# Patient Record
Sex: Male | Born: 1935 | Race: Black or African American | Hispanic: No | Marital: Married | Smoking: Former smoker
Health system: Southern US, Community
[De-identification: ages and names within clinical notes are randomized; demographics above are authoritative.]

## PROBLEM LIST (undated history)

## (undated) DIAGNOSIS — N200 Calculus of kidney: Secondary | ICD-10-CM

## (undated) DIAGNOSIS — K602 Anal fissure, unspecified: Secondary | ICD-10-CM

## (undated) DIAGNOSIS — I739 Peripheral vascular disease, unspecified: Secondary | ICD-10-CM

## (undated) DIAGNOSIS — I779 Disorder of arteries and arterioles, unspecified: Secondary | ICD-10-CM

## (undated) DIAGNOSIS — B029 Zoster without complications: Secondary | ICD-10-CM

## (undated) DIAGNOSIS — K259 Gastric ulcer, unspecified as acute or chronic, without hemorrhage or perforation: Secondary | ICD-10-CM

## (undated) DIAGNOSIS — IMO0002 Reserved for concepts with insufficient information to code with codable children: Secondary | ICD-10-CM

## (undated) DIAGNOSIS — K5792 Diverticulitis of intestine, part unspecified, without perforation or abscess without bleeding: Secondary | ICD-10-CM

## (undated) DIAGNOSIS — M47812 Spondylosis without myelopathy or radiculopathy, cervical region: Secondary | ICD-10-CM

## (undated) DIAGNOSIS — I10 Essential (primary) hypertension: Secondary | ICD-10-CM

## (undated) DIAGNOSIS — I2510 Atherosclerotic heart disease of native coronary artery without angina pectoris: Secondary | ICD-10-CM

## (undated) DIAGNOSIS — M461 Sacroiliitis, not elsewhere classified: Secondary | ICD-10-CM

## (undated) DIAGNOSIS — C259 Malignant neoplasm of pancreas, unspecified: Secondary | ICD-10-CM

## (undated) DIAGNOSIS — E785 Hyperlipidemia, unspecified: Secondary | ICD-10-CM

## (undated) HISTORY — PX: KNEE ARTHROSCOPY: SUR90

## (undated) HISTORY — DX: Malignant neoplasm of pancreas, unspecified: C25.9

## (undated) HISTORY — PX: ROTATOR CUFF REPAIR: SHX139

## (undated) HISTORY — PX: CORONARY ARTERY BYPASS GRAFT: SHX141

---

## 1998-02-10 ENCOUNTER — Emergency Department (HOSPITAL_COMMUNITY): Admission: EM | Admit: 1998-02-10 | Discharge: 1998-02-10 | Payer: Self-pay | Admitting: Emergency Medicine

## 1999-03-13 ENCOUNTER — Encounter: Payer: Self-pay | Admitting: Geriatric Medicine

## 1999-03-13 ENCOUNTER — Ambulatory Visit (HOSPITAL_COMMUNITY): Admission: RE | Admit: 1999-03-13 | Discharge: 1999-03-13 | Payer: Self-pay | Admitting: Geriatric Medicine

## 1999-03-14 ENCOUNTER — Encounter: Payer: Self-pay | Admitting: Geriatric Medicine

## 1999-03-14 ENCOUNTER — Ambulatory Visit (HOSPITAL_COMMUNITY): Admission: RE | Admit: 1999-03-14 | Discharge: 1999-03-14 | Payer: Self-pay | Admitting: Geriatric Medicine

## 1999-03-15 ENCOUNTER — Ambulatory Visit (HOSPITAL_COMMUNITY): Admission: RE | Admit: 1999-03-15 | Discharge: 1999-03-15 | Payer: Self-pay | Admitting: Geriatric Medicine

## 1999-03-15 ENCOUNTER — Encounter: Payer: Self-pay | Admitting: Geriatric Medicine

## 1999-03-17 ENCOUNTER — Encounter: Payer: Self-pay | Admitting: Geriatric Medicine

## 1999-03-17 ENCOUNTER — Ambulatory Visit (HOSPITAL_COMMUNITY): Admission: RE | Admit: 1999-03-17 | Discharge: 1999-03-17 | Payer: Self-pay | Admitting: Geriatric Medicine

## 1999-03-21 ENCOUNTER — Encounter: Payer: Self-pay | Admitting: Geriatric Medicine

## 1999-03-21 ENCOUNTER — Ambulatory Visit (HOSPITAL_COMMUNITY): Admission: RE | Admit: 1999-03-21 | Discharge: 1999-03-21 | Payer: Self-pay | Admitting: Geriatric Medicine

## 1999-04-08 ENCOUNTER — Ambulatory Visit: Admission: RE | Admit: 1999-04-08 | Discharge: 1999-04-08 | Payer: Self-pay | Admitting: Pulmonary Disease

## 1999-04-28 ENCOUNTER — Ambulatory Visit (HOSPITAL_COMMUNITY): Admission: RE | Admit: 1999-04-28 | Discharge: 1999-04-28 | Payer: Self-pay | Admitting: Interventional Cardiology

## 1999-05-12 ENCOUNTER — Encounter: Payer: Self-pay | Admitting: Cardiothoracic Surgery

## 1999-05-13 ENCOUNTER — Inpatient Hospital Stay (HOSPITAL_COMMUNITY): Admission: RE | Admit: 1999-05-13 | Discharge: 1999-05-16 | Payer: Self-pay | Admitting: Cardiothoracic Surgery

## 1999-05-13 ENCOUNTER — Encounter: Payer: Self-pay | Admitting: Cardiothoracic Surgery

## 1999-05-14 ENCOUNTER — Encounter: Payer: Self-pay | Admitting: Cardiothoracic Surgery

## 1999-05-15 ENCOUNTER — Encounter: Payer: Self-pay | Admitting: Cardiothoracic Surgery

## 1999-05-29 ENCOUNTER — Encounter: Payer: Self-pay | Admitting: Cardiothoracic Surgery

## 1999-05-29 ENCOUNTER — Encounter: Admission: RE | Admit: 1999-05-29 | Discharge: 1999-05-29 | Payer: Self-pay | Admitting: Cardiothoracic Surgery

## 1999-07-10 ENCOUNTER — Inpatient Hospital Stay (HOSPITAL_COMMUNITY): Admission: EM | Admit: 1999-07-10 | Discharge: 1999-07-12 | Payer: Self-pay | Admitting: Emergency Medicine

## 1999-07-10 ENCOUNTER — Encounter: Payer: Self-pay | Admitting: Cardiothoracic Surgery

## 1999-07-10 ENCOUNTER — Encounter: Payer: Self-pay | Admitting: *Deleted

## 1999-07-10 ENCOUNTER — Encounter: Admission: RE | Admit: 1999-07-10 | Discharge: 1999-07-10 | Payer: Self-pay | Admitting: *Deleted

## 1999-07-10 ENCOUNTER — Encounter: Admission: RE | Admit: 1999-07-10 | Discharge: 1999-07-10 | Payer: Self-pay | Admitting: Cardiothoracic Surgery

## 1999-07-11 ENCOUNTER — Encounter: Payer: Self-pay | Admitting: *Deleted

## 1999-07-14 ENCOUNTER — Ambulatory Visit (HOSPITAL_COMMUNITY): Admission: RE | Admit: 1999-07-14 | Discharge: 1999-07-14 | Payer: Self-pay | Admitting: *Deleted

## 1999-07-14 ENCOUNTER — Encounter: Payer: Self-pay | Admitting: *Deleted

## 1999-11-13 ENCOUNTER — Encounter: Admission: RE | Admit: 1999-11-13 | Discharge: 1999-11-13 | Payer: Self-pay | Admitting: Cardiothoracic Surgery

## 1999-11-13 ENCOUNTER — Encounter: Payer: Self-pay | Admitting: Cardiothoracic Surgery

## 2000-08-26 ENCOUNTER — Inpatient Hospital Stay (HOSPITAL_COMMUNITY): Admission: EM | Admit: 2000-08-26 | Discharge: 2000-08-30 | Payer: Self-pay

## 2000-08-26 ENCOUNTER — Encounter: Payer: Self-pay | Admitting: Emergency Medicine

## 2000-08-29 ENCOUNTER — Encounter: Payer: Self-pay | Admitting: Interventional Cardiology

## 2000-09-14 ENCOUNTER — Encounter (HOSPITAL_COMMUNITY): Admission: RE | Admit: 2000-09-14 | Discharge: 2000-12-13 | Payer: Self-pay | Admitting: Interventional Cardiology

## 2000-11-05 ENCOUNTER — Ambulatory Visit (HOSPITAL_COMMUNITY): Admission: RE | Admit: 2000-11-05 | Discharge: 2000-11-05 | Payer: Self-pay | Admitting: Geriatric Medicine

## 2001-04-21 ENCOUNTER — Ambulatory Visit (HOSPITAL_COMMUNITY): Admission: RE | Admit: 2001-04-21 | Discharge: 2001-04-21 | Payer: Self-pay | Admitting: Interventional Cardiology

## 2001-04-28 ENCOUNTER — Ambulatory Visit (HOSPITAL_COMMUNITY): Admission: RE | Admit: 2001-04-28 | Discharge: 2001-04-29 | Payer: Self-pay | Admitting: Interventional Cardiology

## 2001-06-15 DIAGNOSIS — K259 Gastric ulcer, unspecified as acute or chronic, without hemorrhage or perforation: Secondary | ICD-10-CM

## 2001-06-15 HISTORY — DX: Gastric ulcer, unspecified as acute or chronic, without hemorrhage or perforation: K25.9

## 2001-12-18 ENCOUNTER — Emergency Department (HOSPITAL_COMMUNITY): Admission: EM | Admit: 2001-12-18 | Discharge: 2001-12-18 | Payer: Self-pay | Admitting: Emergency Medicine

## 2001-12-18 ENCOUNTER — Encounter: Payer: Self-pay | Admitting: Emergency Medicine

## 2002-01-13 ENCOUNTER — Encounter: Admission: RE | Admit: 2002-01-13 | Discharge: 2002-01-13 | Payer: Self-pay | Admitting: Geriatric Medicine

## 2002-01-13 ENCOUNTER — Encounter: Payer: Self-pay | Admitting: Geriatric Medicine

## 2002-03-13 ENCOUNTER — Ambulatory Visit (HOSPITAL_COMMUNITY): Admission: RE | Admit: 2002-03-13 | Discharge: 2002-03-13 | Payer: Self-pay | Admitting: Gastroenterology

## 2003-02-05 ENCOUNTER — Encounter: Admission: RE | Admit: 2003-02-05 | Discharge: 2003-05-06 | Payer: Self-pay | Admitting: Geriatric Medicine

## 2003-09-07 ENCOUNTER — Emergency Department (HOSPITAL_COMMUNITY): Admission: EM | Admit: 2003-09-07 | Discharge: 2003-09-08 | Payer: Self-pay

## 2004-03-05 ENCOUNTER — Encounter (HOSPITAL_BASED_OUTPATIENT_CLINIC_OR_DEPARTMENT_OTHER): Admission: RE | Admit: 2004-03-05 | Discharge: 2004-03-11 | Payer: Self-pay | Admitting: Internal Medicine

## 2004-06-04 ENCOUNTER — Encounter (HOSPITAL_BASED_OUTPATIENT_CLINIC_OR_DEPARTMENT_OTHER): Admission: RE | Admit: 2004-06-04 | Discharge: 2004-09-01 | Payer: Self-pay | Admitting: Internal Medicine

## 2004-07-07 ENCOUNTER — Encounter (INDEPENDENT_AMBULATORY_CARE_PROVIDER_SITE_OTHER): Payer: Self-pay | Admitting: Specialist

## 2004-07-07 ENCOUNTER — Ambulatory Visit (HOSPITAL_COMMUNITY): Admission: RE | Admit: 2004-07-07 | Discharge: 2004-07-07 | Payer: Self-pay | Admitting: Gastroenterology

## 2004-09-02 ENCOUNTER — Encounter (HOSPITAL_BASED_OUTPATIENT_CLINIC_OR_DEPARTMENT_OTHER): Admission: RE | Admit: 2004-09-02 | Discharge: 2004-09-10 | Payer: Self-pay | Admitting: Internal Medicine

## 2004-11-06 ENCOUNTER — Ambulatory Visit (HOSPITAL_COMMUNITY): Admission: RE | Admit: 2004-11-06 | Discharge: 2004-11-06 | Payer: Self-pay | Admitting: Interventional Cardiology

## 2004-11-17 ENCOUNTER — Inpatient Hospital Stay (HOSPITAL_COMMUNITY): Admission: RE | Admit: 2004-11-17 | Discharge: 2004-11-21 | Payer: Self-pay | Admitting: Cardiothoracic Surgery

## 2004-12-22 ENCOUNTER — Encounter (HOSPITAL_COMMUNITY): Admission: RE | Admit: 2004-12-22 | Discharge: 2005-03-22 | Payer: Self-pay | Admitting: Interventional Cardiology

## 2005-08-14 ENCOUNTER — Encounter: Admission: RE | Admit: 2005-08-14 | Discharge: 2005-08-14 | Payer: Self-pay | Admitting: Interventional Cardiology

## 2005-08-18 ENCOUNTER — Inpatient Hospital Stay (HOSPITAL_BASED_OUTPATIENT_CLINIC_OR_DEPARTMENT_OTHER): Admission: RE | Admit: 2005-08-18 | Discharge: 2005-08-18 | Payer: Self-pay | Admitting: Interventional Cardiology

## 2006-01-04 ENCOUNTER — Ambulatory Visit: Payer: Self-pay | Admitting: Pulmonary Disease

## 2006-01-05 ENCOUNTER — Encounter: Admission: RE | Admit: 2006-01-05 | Discharge: 2006-01-05 | Payer: Self-pay | Admitting: Geriatric Medicine

## 2006-02-01 ENCOUNTER — Ambulatory Visit: Payer: Self-pay | Admitting: Pulmonary Disease

## 2007-06-30 ENCOUNTER — Ambulatory Visit: Payer: Self-pay | Admitting: *Deleted

## 2008-07-12 ENCOUNTER — Ambulatory Visit: Payer: Self-pay | Admitting: *Deleted

## 2009-07-10 ENCOUNTER — Ambulatory Visit: Payer: Self-pay | Admitting: Surgery

## 2010-07-06 ENCOUNTER — Encounter: Payer: Self-pay | Admitting: Cardiothoracic Surgery

## 2010-07-10 ENCOUNTER — Ambulatory Visit
Admission: RE | Admit: 2010-07-10 | Discharge: 2010-07-10 | Payer: Self-pay | Source: Home / Self Care | Attending: Vascular Surgery | Admitting: Vascular Surgery

## 2010-07-10 ENCOUNTER — Ambulatory Visit: Admit: 2010-07-10 | Payer: Self-pay | Admitting: Vascular Surgery

## 2010-07-11 NOTE — Procedures (Unsigned)
CAROTID DUPLEX EXAM  INDICATION:  Carotid stenosis.  HISTORY: Diabetes:  Yes. Cardiac:  PTCA and stent, CABG. Hypertension:  Yes. Smoking:  Previous. Previous Surgery:  No. CV History:  Asymptomatic. Amaurosis Fugax No, Paresthesias No, Hemiparesis No.                                      RIGHT             LEFT Brachial systolic pressure:         125               127 Brachial Doppler waveforms:         Normal            Normal Vertebral direction of flow:        Antegrade         Antegrade DUPLEX VELOCITIES (cm/sec) CCA peak systolic                   62                M = 94, D = 209 ECA peak systolic                   96                311 ICA peak systolic                   84                166 ICA end diastolic                   32                69 PLAQUE MORPHOLOGY:                  Calcific          Calcific PLAQUE AMOUNT:                      Mild              Moderate PLAQUE LOCATION:                    ICA, ECA, CCA     ICA, ECA, CCA  IMPRESSION: 1. Right internal carotid artery velocities suggest a 1% to 39%     stenosis. 2. Left internal carotid artery velocities suggest 40% to 59%     stenosis. 3. Left distal common carotid artery stenosis. 4. Left external carotid artery stenosis.  ___________________________________________ Janetta Hora Fields, MD  EM/MEDQ  D:  07/10/2010  T:  07/10/2010  Job:  [id number]

## 2010-07-11 NOTE — Assessment & Plan Note (Signed)
OFFICE VISIT  Christian Kaiser, Christian Kaiser DOB:  1934/12/03                                       07/10/2010 UEAVW#:[id number]  CHIEF COMPLAINT:  Carotid stenosis.  HISTORY OF PRESENT ILLNESS:  The patient is a 76 year old male who has a known asymptomatic carotid stenosis.  He denies any symptoms of TIA, amaurosis or stroke.  The carotid stenosis has been known since 2006 on a screening duplex that was done prior to coronary artery bypass grafting.  The patient denies any recent cardiac symptoms as well as denying any recent neurologic symptoms.  CHRONIC MEDICAL PROBLEMS:  Include diabetes, coronary artery disease and elevated cholesterol.  These are both followed by Dr. Katrinka Blazing and Dr. Pete Glatter and they are currently stable.  PAST SURGICAL HISTORY:  Kidney stone, coronary artery bypass grafting.  SOCIAL HISTORY:  He has 2 children.  He is retired.  He is married.  He is a nonsmoker currently.  He is retired from [employer].  FAMILY HISTORY:  Not remarkable for vascular disease at a young age.  REVIEW OF SYSTEMS:  He has some pain in his legs when walking.  However, this does not sound like claudication.  All other systems are negative.  PHYSICAL EXAM:  Vital signs:  Blood pressure is 124/67 in the right arm, 115/66 in the left arm, oxygen saturation is 97% on room air, heart rate 64.  HEENT:  Unremarkable.  Neck:  Has 2+ carotid pulses.  He has no carotid bruit.  Chest:  Clear to auscultation.  Cardiac:  Regular rate and rhythm without murmur.  Abdomen:  Soft, nontender, nondistended.  No masses.  Extremities:  He has 2+ radial, femoral, dorsalis pedis and posterior tibial pulses bilaterally.  Musculoskeletal:  Shows no obvious major joint deformities.  Neurologic:  Shows symmetric upper extremity and lower extremity motor strength which is 5/5.  Skin:  Has no open ulcers or rashes.  He had a carotid duplex exam today which showed less than 40% right internal  carotid artery stenosis.  He had a 40% to 60% left internal carotid artery stenosis.  His duplex is essentially unchanged.  In summary, the patient has a moderate left internal carotid artery stenosis which is currently asymptomatic.  I believe the best option for him would be continued surveillance.  We will reschedule him for a carotid duplex exam in 1 year's time.  He will continue his antiplatelet therapy in the form of aspirin 81 mg once a day.    Janetta Hora. Fields, MD Electronically Signed  CEF/MEDQ  D:  07/10/2010  T:  07/11/2010  Job:  [id number]  cc:   Hal T. Stoneking, M.D. Lyn Records, M.D.

## 2010-10-27 ENCOUNTER — Other Ambulatory Visit: Payer: Self-pay | Admitting: Orthopaedic Surgery

## 2010-10-27 DIAGNOSIS — M25562 Pain in left knee: Secondary | ICD-10-CM

## 2010-10-28 NOTE — Procedures (Signed)
CAROTID DUPLEX EXAM   INDICATION:  Follow up carotid artery disease.   HISTORY:  Diabetes:  Yes  Cardiac:  PTCA and stent, CABG  Hypertension:  Yes  Smoking:  Previous  Previous Surgery:  No  CV History:  Asymptomatic  Amaurosis Fugax No, Paresthesias No, Hemiparesis No                                       RIGHT             LEFT  Brachial systolic pressure:         120               124  Brachial Doppler waveforms:         WNL               WNL  Vertebral direction of flow:        Antegrade         Antegrade  DUPLEX VELOCITIES (cm/sec)  CCA peak systolic                   66                76  ECA peak systolic                   130               236  ICA peak systolic                   95                158  ICA end diastolic                   28                44  PLAQUE MORPHOLOGY:                  Calcific          Calcific  PLAQUE AMOUNT:                      Mild              Moderate  PLAQUE LOCATION:                    ICA/ECA/CCA       ICA/ECA/CCA   IMPRESSION:  1. Right internal carotid artery shows evidence of 20% to 39%      stenosis.  2. Left internal carotid artery shows evidence of 40% to 59% stenosis.  3. Left external carotid artery stenosis.  4. No significant changes from previous study.   The patient is scheduled to see doctor here in 1 year at time of next  ultrasound due to time since last appointment with vascular doctor.   ___________________________________________  V. Charlena Cross, MD   AS/MEDQ  D:  07/10/2009  T:  07/10/2009  Job:  [id number]   cc:   Hal T. Stoneking, M.D.  Lyn Records, M.D.

## 2010-10-28 NOTE — Procedures (Signed)
CAROTID DUPLEX EXAM   INDICATION:  Followup carotid artery disease.   HISTORY:  Diabetes:  Yes.  Cardiac:  PTCA and stent, CABG.  Hypertension:  Yes.  Smoking:  Previous.  Previous Surgery:  No.  CV History:  Currently asymptomatic.  Amaurosis Fugax No, Paresthesias No, Hemiparesis No                                       RIGHT             LEFT  Brachial systolic pressure:         150               140  Brachial Doppler waveforms:         Normal            Normal  Vertebral direction of flow:        Antegrade         Antegrade  DUPLEX VELOCITIES (cm/sec)  CCA peak systolic                   81                84  ECA peak systolic                   93                239  ICA peak systolic                   92                159  ICA end diastolic                   29                27  PLAQUE MORPHOLOGY:                  Calcific          Calcific  PLAQUE AMOUNT:                      Mild              Moderate  PLAQUE LOCATION:                    ICA/ECA/CCA       ICA/ECA/CCA   IMPRESSION:  1. 1-39% stenosis of the right internal carotid artery.  2. 40-59% stenosis of the left internal carotid artery.  3. No significant change noted when compared to the previous exam on      06/30/2007.   ___________________________________________  P. Liliane Bade, M.D.   CH/MEDQ  D:  07/12/2008  T:  07/12/2008  Job:  [id number]   cc:   Hal T. Stoneking, M.D.

## 2010-10-28 NOTE — Procedures (Signed)
CAROTID DUPLEX EXAM   INDICATION:  Followup, carotid artery disease.   HISTORY:  Diabetes:  Yes, on oral medication.  Cardiac:  PTCA and stent, CABG, in June, 2006.  Hypertension:  Yes.  Smoking:  Quit in 1990.  Previous Surgery:  CABG.  CV History:  Amaurosis Fugax No, Paresthesias No, Hemiparesis No                                       RIGHT             LEFT  Brachial systolic pressure:         148               140  Brachial Doppler waveforms:         Biphasic          Biphasic  Vertebral direction of flow:        Antegrade         Antegrade  DUPLEX VELOCITIES (cm/sec)  CCA peak systolic                   72                79  ECA peak systolic                   67                136  ICA peak systolic                   62                153  ICA end diastolic                   19                39  PLAQUE MORPHOLOGY:                  Calcified         Calcified  PLAQUE AMOUNT:                      Mild              Moderate  PLAQUE LOCATION:                    ICA               ICA, ECA   IMPRESSION:  1. Mild left external carotid artery stenosis.  2. 40-59% left internal carotid artery stenosis by new criteria.  3. 20-39% right internal carotid artery stenosis.  4. Study unchanged from 06/24/2006.   ___________________________________________  P. Liliane Bade, M.D.   DP/MEDQ  D:  06/30/2007  T:  06/30/2007  Job:  [id number]

## 2010-10-30 ENCOUNTER — Ambulatory Visit
Admission: RE | Admit: 2010-10-30 | Discharge: 2010-10-30 | Disposition: A | Discharge status: Home / Self Care | Payer: MEDICARE | Source: Ambulatory Visit | Attending: Orthopaedic Surgery | Admitting: Orthopaedic Surgery

## 2010-10-30 DIAGNOSIS — M25562 Pain in left knee: Secondary | ICD-10-CM

## 2010-10-31 NOTE — Op Note (Signed)
NAME:  MATAI, CARPENITO NO.:  [id number]   MEDICAL RECORD NO.:  [id number]          PATIENT TYPE:  AMB   LOCATION:  ENDO                         FACILITY:  [facility]   PHYSICIAN:  Danise Edge, M.D.   DATE OF BIRTH:  [birth year]   DATE OF PROCEDURE:  07/07/2004  DATE OF DISCHARGE:                                 OPERATIVE REPORT   PROCEDURE:  Colonoscopy.   PROCEDURE INDICATION:  Mr. Sherrod Toothman is a 75 year old male born [birth year].  Mr. Kanner is scheduled to undergo his first screening colonoscopy  with polypectomy to prevent colon cancer.   MEDICATION ALLERGIES:  None.   CHRONIC MEDICATIONS:  Altace, Pravachol, Plavix, Amaryl, Actos.   PAST MEDICAL HISTORY:  1.  Gastric ulcer 2003.  2.  Coronary artery disease complicated by anterior myocardial infarction,      four-vessel coronary artery bypass grafting, 1990.  3.  Nephrolithiasis.  4.  Colonic diverticulosis.  5.  Type 2 diabetes mellitus.  6.  Anal fissure repair.  7.  Left shoulder surgery  8.  Right parotid duct stone.  9.  Shingles  10. Sacro-iliitis.  11. Hypertension.  12. Cervical spondylosis.   ENDOSCOPIST:  Danise Edge, M.D.   PREMEDICATION:  Versed 6 mg, Demerol 50 mg.   PROCEDURE:  After obtaining informed consent, Mr. Wemhoff was placed in the  left lateral decubitus position.  I administered intravenous Demerol and  intravenous Versed to achieve conscious sedation for the procedure.  The  patient's blood pressure, oxygen saturation and cardiac rhythm were  monitored throughout the procedure and documented in the medical record.   Anal inspection and digital rectal exam normal.  The prostate was  nonnodular.  The Olympus adjustable pediatric colonoscope was introduced  into the rectum and advanced to the cecum.  Colonic preparation for the exam  today was excellent.   Rectum:  From the midrectum a 1-mm sessile polyp was removed with cold  biopsy forceps.   Sigmoid colon  and descending colon:  Colonic diverticulosis.   Splenic flexure normal.   Transverse colon normal.   Hepatic flexure normal.   Ascending colon normal.   Cecum and ileocecal valve normal.   ASSESSMENT:  1.  A diminutive polyp was removed from the rectum.  2.  Left colonic diverticulosis.  3.  Otherwise normal proctocolonoscopy to the cecum.                                               ______________________________  Danise Edge, M.D.    MJ/MEDQ  D:  07/07/2004  T:  07/07/2004  Job:  [id number]   cc:   Hal T. Stoneking, M.D.  [address] [place], [place] [address]  Fax: [phone]

## 2010-10-31 NOTE — Cardiovascular Report (Signed)
NAME:  Christian Kaiser, Christian Kaiser NO.:  [id number]   MEDICAL RECORD NO.:  [id number]          PATIENT TYPE:  OIB   LOCATION:  1966                         FACILITY:  [facility]   PHYSICIAN:  Christian Kaiser, M.D.   DATE OF BIRTH:  1934-09-25   DATE OF PROCEDURE:  08/18/2005  DATE OF DISCHARGE:  08/18/2005                              CARDIAC CATHETERIZATION   Christian Kaiser has undergone coronary bypass grafting on two prior occasions,  most recently in 2005. We had a redo saphenous vein graft to the right  coronary and a redo sequential saphenous vein graft to the second and third  obtuse marginal branches. The LIMA to the LAD was left intact with the  previous operations in the early 1990s. In the past month, he has begun  experiencing exertional dyspnea, had tightness in his chest and a Cardiolite  study demonstrated midlateral wall ischemia. The procedure is being done to  define coronary anatomy.   PROCEDURE:  1.  Left heart catheterization.  2.  Selective angiography.  3.  Left ventriculography.  4.  Saphenous vein graft angiography.  5.  Left internal mammary graft selective angiography.   DESCRIPTION:  After informed consent, a 4-French sheath was placed in the  right femoral artery using the modified Seldinger technique. A 4-French A2  multipurpose catheter was then used for hemodynamic recordings. Left  ventriculography by hand injection of the RAO 30 degree and LAO 40 with 20  degrees of cranial angulation. Following this, a pullback pressure was  recorded across the aortic valve. The multipurpose catheter was then used  for native right coronary angiography and saphenous vein graft angiography.  A #4 4-French left Judkins catheter was used for left coronary angiography  and a #4 internal mammary catheter was used for internal mammary artery  angiography. We used two types of internal mammary catheter. One was a  Cordis catheter and the other was a Medtronic  catheter.   Following the procedure, the sheath was removed and hemostasis achieved by  manual pressure.   RESULTS:  1.  Hemodynamic data:      1.  Aortic pressure 127/76.      2.  Left ventricular pressure was 28/6 mmHg.  2.  Left ventriculography: Low normal and estimated to be 45-55%. No obvious      mitral regurgitation is noted. Surprisingly, with two previous lateral      wall infarction, the patient's lateral wall motion is relatively normal.  3.  Coronary angiography:      1.  Left main coronary: Totally occluded.      2.  The left anterior descending coronary: Occluded.      3.  The circumflex artery: Occluded.      4.  The right coronary artery: Totally occluded proximally.  4.  Saphenous vein graft angiography.      1.  The sequential saphenous vein graft to the second and third obtuse          marginal branches is widely patent. Retrograde filling of the first          obtuse  marginal is late and there is disease at the origin from the          proximal circumflex that causes this territory to be potentially          ischemic. There is not a way to reach this territory for PCI          purposes. There is also disease in the distal circumflex, obtuse          marginal #4 territory that is also a threat but without an          opportunity to be reached and potentially exempted.      2.  Saphenous vein graft to the right coronary: Widely patent.  5.  Internal mammary graft angiography: This graft is widely patent. Runoff      into the native LAD was now well visualized but flow antegrade from the      graft insertion site is normal. There is no obstructive disease noted      and there is no midvessel obstructive disease noted proximal to the      graft insertion site.  6.  Total occlusion of the left main and right coronary ostium.  7.  Mild decreased to low normal left ventricular function, EF 45-50%   PLAN:  Medical therapy.      Christian Kaiser, M.D.   Electronically Signed     HWS/MEDQ  D:  08/18/2005  T:  08/18/2005  Job:  [id number]   cc:   Christian Kaiser, M.D.  Fax: [phone]   Sheliah Plane, MD  [address]  [place]  [place] [address]

## 2010-10-31 NOTE — Assessment & Plan Note (Signed)
[facility]                               PULMONARY OFFICE NOTE   LOPEZ, DENTINGER                      MRN:          [id number]  DATE:01/04/2006                            DOB:          [birth year]    HISTORY OF PRESENT ILLNESS:  The patient is a very pleasant 76 year old  black gentleman who I have been asked to see for obstructive sleep apnea.  The patient recently underwent nocturnal polysomnography on Oct 22, 2005  where he was found to have a respiratory disturbance index of 89 events per  hour and O2 desaturation as low as 82%.  There were small numbers of central  events also noted during that time.  The patient subsequently underwent a  CPAP titration study on Nov 08, 2005 and had a very difficult time with mask  leak, and really was not able to achieve optimal pressures even on BiPAP.  The patient has now been placed on an auto BiPAP machine which he is  currently using with a full face mask.  He has difficulties with this  blowing my mouth open.  He does, however, feel the mask is comfortable and  that it fits fairly well.  It should be noted that he does not have a heater  or humidifier with his machine.  Prior to all this, the patient states that  he would typically go to bed at 11:30 and get up at 9 a.m. to start his day.  The patient felt that he was rested and had no difficulties in the morning  with alertness; however, his wife disagrees with this.  She states that he  had loud snoring during the night and pauses in his breathing during sleep.  She also states that if he tries to watch TV or do something very quiet  other than reading, he will doze off quite easily.  The patient feels that  his alertness level is not being affected, however, he does take naps in the  afternoon 3 days out of 7.  He denies any issues with driving.  Of note, his  weight is up 30 pounds over the last two years.   PAST MEDICAL HISTORY:  1.   Significant for coronary artery disease.  He is status post bypass      surgery x2 as well as percutaneous intervention.  He does have      congestive heart failure by his history.  2.  History of hypertension.  3.  History of diabetes.  4.  History of nephrolithiasis.   CURRENT MEDICATIONS:  1.  Include glimepiride 4 mg daily.  2.  Metoprolol 25 mg daily.  3.  Vytorin 10/80 daily.  4.  Plavix 75 mg daily.  5.  Actos 15 mg daily.  6.  Micardis 80 mg daily.  7.  NitroQuick p.r.n.   The patient has no known drug allergies.   SOCIAL HISTORY:  He is married and has children.  He has a history of  smoking one pack 2-3 days for 20-30 years but has not smoked since 2005.   FAMILY  HISTORY:  Remarkable for a sister having had asthma.  Brother having  had heart disease and father having had cancer of unknown type.   REVIEW OF SYSTEMS:  As per history of present illness.  Also, see patient  intake form documented in the chart.   PHYSICAL EXAM:  In general, he is an obese black male in no acute distress.  Blood pressure is 128/86, pulse 49, temperature is 98.1, weight is 264  pounds, O2 saturation on room air is 97%.  HEENT:  Pupils equal, round, reactive to light and accommodation.  Extraocular muscles are intact.  Nares show mild septal deviation to the  left.  Oropharynx shows elongation of the soft palate and uvula.  NECK:  Supple without JVD or lymphadenopathy.  There is no palpable  thyromegaly.  CHEST:  Totally clear with mild decrease in breath sounds.  CARDIAC:  Exam reveals a regular rate and rhythm.  ABDOMEN:  Soft, nontender with good bowel sounds.  GENITAL, RECTAL AND BREAST:  Exam was not done and not indicated.  LOWER EXTREMITIES:  Without significant edema.  There is no calf tenderness.  NEUROLOGIC:  He is alert and oriented with no obvious observable motor  defects.   IMPRESSION:  Severe obstructive sleep apnea documented by nocturnal  polysomnography.  I believe the  patient is much more symptomatic than he is  letting on, and the wife definitely agrees with this assessment.  He appears  sleepy to me even today in the office during my evaluation.  I have had a  long discussion with him about the pathophysiology of sleep apnea including  the short-term quality of life issues and the long-term cardiovascular  issues.  It is really essential that we get him on CPAP and also have him  work aggressively on weight loss.  Other treatment options will not be  satisfying with respect to success.  The patient is currently on an auto  BiPAP and is having significant tolerance issues with this.  I suspect it is  because it is cycling up to higher pressure levels during his more severe  apneic events.  I really think that we ought to go back to basics and start  him on a CPAP machine at a much lower pressure level and give him a 3-4 week  period of adjustment prior to getting him up to the higher therapeutic  pressures which he may have a hard time tolerating.  I also think it will be  essential that he have a heated humidifier in order to keep his nasal airway  and posterior pharynx moist.  I have never seen a patient tolerate CPAP  without humidity.  He seems to be satisfied with his current mask, but we  will see how things go over the next four weeks.   PLAN:  1.  Will change the patient's auto BiPAP machine to a CPAP machine at 10 cm.      He understands this may not be his optimal pressure, but will allow him      an adjustment period.  2.  Work aggressively on weight loss.  3.  Provide heated humidifier.  I have instructed the patient on how to use      this appropriately.  4.  The patient is to follow up in four weeks to see how things are      progressing, however, I      have asked him to call me to help with troubleshooting if he  has     difficulties.  On his return visit, we will talk about optimizing his      pressure.                                    Barbaraann Share, MD, FCCP   [facility]/MedQ  DD:  01/04/2006  DT:  01/04/2006  Job #:  [id number]   cc:   Lyn Records, MD  Hal T. Pete Glatter, MD

## 2010-10-31 NOTE — Assessment & Plan Note (Signed)
[facility]                               PULMONARY OFFICE NOTE   Christian, Tugwell ABDULMALIK Kaiser                      MRN:          [id number]  DATE:02/01/2006                            DOB:          Oct 15, 1934    SUBJECTIVE:  Christian Kaiser comes in today after being changed over to a C-PAP  machine with heated humidity at a pressure level of 10 cm.  He has been  doing much better on this.  His wife has not complained about snoring, and  he feels that the mask and pressure are fairly comfortable for him.  He has  been very compliant with the machine over the last four weeks, except for  four to five days last week, when he had a stomach virus and was having to  have frequent bowel movements during the night.  The patient feels that he  is sleeping better when wearing the C-PAP and is more rested during the day.  He is having no difficulties with the pressure.   PHYSICAL EXAMINATION:  VITAL SIGNS:  BP is 114/82, pulse 53, temperature  97.8, weight is 262 pounds, O2 saturation on room air is 98%.  SKIN:  There is no evidence of skin break-down or pressure necrosis from the  C-PAP mask.   IMPRESSION:  Severe obstructive sleep apnea which seems to be doing much  better on C-PAP.  The patient is on a pressure of 10 with heated  humidification and has had improved compliance.  He has noticed a difference  in both his sleep efficiency and daytime alertness.  At this point in time,  I think we need to optimize the C-PAP pressure for him with an auto set  device.  He is agreeable to this approach.   PLAN:  1. Auto set device times two weeks with download.  I will call the patient      with his optimal pressure settings and have his own machine at home      adjusted to this level.  2. Work aggressively on weight-loss.  3. The patient will follow up in six months or sooner if there are      problems.                                   Barbaraann Share, MD, FCCP   [facility]/MedQ  DD:  02/01/2006  DT:  02/01/2006  Job #:  [id number]   cc:   Hal T. Pete Glatter, MD  Lyn Records, MD

## 2010-10-31 NOTE — H&P (Signed)
NAME:  Christian Kaiser, Christian Kaiser NO.:  [id number]   MEDICAL RECORD NO.:  [id number]          PATIENT TYPE:  OIB   LOCATION:  1966                         FACILITY:  [facility]   PHYSICIAN:  Lyn Records, M.D.   DATE OF BIRTH:  [birth year]   DATE OF ADMISSION:  08/18/2005  DATE OF DISCHARGE:                                HISTORY & PHYSICAL   REASON FOR ADMISSION:  Chest pain.   [name] is a 76 year old male patient who has undergone back surgery on  two separate occasions under the care of Dr. Sheliah Plane.  He has had a  month-long history of exertional dyspnea.  He underwent a Cardiolite study  that showed lateral wall ischemia as well as inferior lateral and basal  ischemia.   The patient has a past medical history of coronary artery bypass grafting  around 1990 with the following grafts:  LIMA to LAD, SVG to the RCA, SVG to  the circumflex/obtuse marginal.  Ultimately he did require redo bypass  surgery and the following grafts were performed:  Left thigh endo vein  harvesting with reversed saphenous vein graft to the OM and distal  circumflex, reversed saphenous vein graft to the posterior descending, and  preservation of the previously placed LIMA artery in June of 2006.   Because of his chest pain and positive Cardiolite he is now for cardiac  catheterization.   PAST MEDICAL HISTORY:  1.  Diabetes mellitus.  2.  Hypertension.  3.  Hyperlipidemia.  4.  Coronary artery disease as above.   MEDICATIONS:  1.  Plavix 75 mg a day.  2.  Zocor 40 mg a day.  3.  Micardis 40 mg a day.  4.  Toprol XL 25 mg a day.  5.  Amaryl 4 mg a day.  6.  Actos 15 mg a day.   ALLERGIES:  ASPIRIN irritates stomach.   SOCIAL HISTORY:  No tobacco or alcohol use.   FAMILY HISTORY:  Dad had a history of rectal cancer.   PHYSICAL EXAMINATION:  VITAL SIGNS:  Blood pressure 138/88, pulse 92,  respirations 20, weight 260.  HEENT:  Grossly normal.  No carotid or subclavian bruits.   No JVD or  thyromegaly.  Sclerae clear.  Conjunctivae normal.  Nares without drainage.  CHEST:  Clear to auscultation bilaterally.  No wheezing or rhonchi.  HEART:  Regular rate and rhythm.  No gross murmur.  ABDOMEN:  Obese.  Good bowel sounds.  Nontender, nondistended.  No mass.  No  bruits.  EXTREMITIES:  No femoral bruits.  Lower extremities:  No peripheral edema.  Palpable lower extremity pulses.  SKIN:  He does have a lower pole sternal wound with an area of fluctuation.  It is tender.  It did not drain.  He did see Dr. Dorris Fetch last week and  he is scheduled to see Dr. Tyrone Sage this Thursday.  This area has had no  drainage.   ASSESSMENT/PLAN:  1.  Recurrent chest pain.  2.  Coronary artery disease, history of bypass grafting on two occasions,      1990, 2006  with grafts as above.  3.  Hypertension, treated.  4.  Diabetes mellitus, treated.  5.  Hyperlipidemia, treated.   Today the patient is undergoing cardiac catheterization under the care of  Dr. Garnette Scheuermann.      Christian Kaiser, P.A.      Lyn Records, M.D.  Electronically Signed    LB/MEDQ  D:  08/18/2005  T:  08/18/2005  Job:  [id number]   cc:   Hal T. Stoneking, M.D.  Fax: [phone]   Sheliah Plane, MD  [address]  [place]  [place] [address]

## 2011-07-10 ENCOUNTER — Other Ambulatory Visit: Payer: Self-pay

## 2011-11-27 ENCOUNTER — Observation Stay (HOSPITAL_COMMUNITY)
Admission: EM | Admit: 2011-11-27 | Discharge: 2011-11-28 | Disposition: A | Discharge status: Home / Self Care | Payer: Medicare Other | Attending: Family Medicine | Admitting: Family Medicine

## 2011-11-27 ENCOUNTER — Emergency Department (HOSPITAL_COMMUNITY): Payer: Medicare Other

## 2011-11-27 ENCOUNTER — Encounter (HOSPITAL_COMMUNITY): Payer: Self-pay | Admitting: Nurse Practitioner

## 2011-11-27 DIAGNOSIS — D696 Thrombocytopenia, unspecified: Secondary | ICD-10-CM | POA: Insufficient documentation

## 2011-11-27 DIAGNOSIS — R739 Hyperglycemia, unspecified: Secondary | ICD-10-CM | POA: Insufficient documentation

## 2011-11-27 DIAGNOSIS — I10 Essential (primary) hypertension: Secondary | ICD-10-CM

## 2011-11-27 DIAGNOSIS — R0602 Shortness of breath: Secondary | ICD-10-CM | POA: Insufficient documentation

## 2011-11-27 DIAGNOSIS — R7989 Other specified abnormal findings of blood chemistry: Secondary | ICD-10-CM

## 2011-11-27 DIAGNOSIS — N179 Acute kidney failure, unspecified: Secondary | ICD-10-CM | POA: Insufficient documentation

## 2011-11-27 DIAGNOSIS — R079 Chest pain, unspecified: Principal | ICD-10-CM | POA: Diagnosis present

## 2011-11-27 DIAGNOSIS — E118 Type 2 diabetes mellitus with unspecified complications: Secondary | ICD-10-CM | POA: Insufficient documentation

## 2011-11-27 DIAGNOSIS — I2510 Atherosclerotic heart disease of native coronary artery without angina pectoris: Secondary | ICD-10-CM | POA: Insufficient documentation

## 2011-11-27 DIAGNOSIS — E785 Hyperlipidemia, unspecified: Secondary | ICD-10-CM | POA: Insufficient documentation

## 2011-11-27 DIAGNOSIS — R42 Dizziness and giddiness: Secondary | ICD-10-CM | POA: Insufficient documentation

## 2011-11-27 DIAGNOSIS — E1165 Type 2 diabetes mellitus with hyperglycemia: Secondary | ICD-10-CM

## 2011-11-27 DIAGNOSIS — IMO0002 Reserved for concepts with insufficient information to code with codable children: Secondary | ICD-10-CM | POA: Insufficient documentation

## 2011-11-27 DIAGNOSIS — D649 Anemia, unspecified: Secondary | ICD-10-CM | POA: Insufficient documentation

## 2011-11-27 HISTORY — DX: Diverticulitis of intestine, part unspecified, without perforation or abscess without bleeding: K57.92

## 2011-11-27 HISTORY — DX: Gastric ulcer, unspecified as acute or chronic, without hemorrhage or perforation: K25.9

## 2011-11-27 HISTORY — DX: Peripheral vascular disease, unspecified: I73.9

## 2011-11-27 HISTORY — DX: Zoster without complications: B02.9

## 2011-11-27 HISTORY — DX: Essential (primary) hypertension: I10

## 2011-11-27 HISTORY — DX: Atherosclerotic heart disease of native coronary artery without angina pectoris: I25.10

## 2011-11-27 HISTORY — DX: Spondylosis without myelopathy or radiculopathy, cervical region: M47.812

## 2011-11-27 HISTORY — DX: Calculus of kidney: N20.0

## 2011-11-27 HISTORY — DX: Anal fissure, unspecified: K60.2

## 2011-11-27 HISTORY — DX: Hyperlipidemia, unspecified: E78.5

## 2011-11-27 HISTORY — DX: Sacroiliitis, not elsewhere classified: M46.1

## 2011-11-27 HISTORY — DX: Disorder of arteries and arterioles, unspecified: I77.9

## 2011-11-27 LAB — BASIC METABOLIC PANEL
Creatinine, Ser: 1.86 mg/dL — ABNORMAL HIGH (ref 0.50–1.35)
GFR calc Af Amer: 39 mL/min — ABNORMAL LOW (ref >90)
Glucose, Bld: 394 mg/dL — ABNORMAL HIGH (ref 70–99)
Potassium: 4.5 mEq/L (ref 3.5–5.1)
Sodium: 137 mEq/L (ref 135–145)

## 2011-11-27 LAB — COMPREHENSIVE METABOLIC PANEL
ALT: 13 U/L (ref 0–53)
AST: 14 U/L (ref 0–37)
CO2: 20 mEq/L (ref 19–32)
Calcium: 9.7 mg/dL (ref 8.4–10.5)
Chloride: 101 mEq/L (ref 96–112)
GFR calc Af Amer: 48 mL/min — ABNORMAL LOW (ref >90)
GFR calc non Af Amer: 41 mL/min — ABNORMAL LOW (ref >90)
Glucose, Bld: 247 mg/dL — ABNORMAL HIGH (ref 70–99)
Sodium: 135 mEq/L (ref 135–145)
Total Bilirubin: 0.2 mg/dL — ABNORMAL LOW (ref 0.3–1.2)

## 2011-11-27 LAB — CBC
HCT: 35.9 % — ABNORMAL LOW (ref 39.0–52.0)
Hemoglobin: 12.1 g/dL — ABNORMAL LOW (ref 13.0–17.0)
Hemoglobin: 11.9 g/dL — ABNORMAL LOW (ref 13.0–17.0)
MCH: 28.3 pg (ref 26.0–34.0)
MCH: 29.1 pg (ref 26.0–34.0)
MCHC: 33.7 g/dL (ref 30.0–36.0)
MCV: 84.1 fL (ref 78.0–100.0)
Platelets: 137 10*3/uL — ABNORMAL LOW (ref 150–400)
RBC: 4.09 MIL/uL — ABNORMAL LOW (ref 4.22–5.81)
WBC: 6.7 10*3/uL (ref 4.0–10.5)

## 2011-11-27 LAB — PRO B NATRIURETIC PEPTIDE: Pro B Natriuretic peptide (BNP): 191.6 pg/mL — ABNORMAL HIGH (ref 0–125)

## 2011-11-27 LAB — POCT I-STAT TROPONIN I: Troponin i, poc: 0 ng/mL (ref 0.00–0.08)

## 2011-11-27 MED ORDER — SODIUM CHLORIDE 0.9 % IV SOLN: 250.0000 mL | INTRAVENOUS | Status: DC | PRN

## 2011-11-27 MED ORDER — ENOXAPARIN SODIUM 40 MG/0.4ML ~~LOC~~ SOLN
40.0000 mg | Freq: Every day | SUBCUTANEOUS | Status: DC
  Administered 2011-11-28: 40 mg via SUBCUTANEOUS
  Filled 2011-11-27: qty 0.4

## 2011-11-27 MED ORDER — HYDROMORPHONE HCL PF 1 MG/ML IJ SOLN
0.5000 mg | INTRAMUSCULAR | Status: DC | PRN
Start: 2011-11-27 — End: 2011-11-28

## 2011-11-27 MED ORDER — ALUM & MAG HYDROXIDE-SIMETH 200-200-20 MG/5ML PO SUSP: 30.0000 mL | Freq: Four times a day (QID) | ORAL | Status: DC | PRN

## 2011-11-27 MED ORDER — SODIUM CHLORIDE 0.9 % IV SOLN: INTRAVENOUS | Status: DC

## 2011-11-27 MED ORDER — ACETAMINOPHEN 325 MG PO TABS
650.0000 mg | ORAL_TABLET | Freq: Once | ORAL | Status: AC
  Administered 2011-11-27: 650 mg via ORAL
  Filled 2011-11-27: qty 2

## 2011-11-27 MED ORDER — SODIUM CHLORIDE 0.9 % IJ SOLN: 3.0000 mL | INTRAMUSCULAR | Status: DC | PRN

## 2011-11-27 MED ORDER — NITROGLYCERIN 0.4 MG SL SUBL
0.4000 mg | SUBLINGUAL_TABLET | SUBLINGUAL | Status: DC | PRN
  Administered 2011-11-27 (×2): 0.4 mg via SUBLINGUAL

## 2011-11-27 MED ORDER — ONDANSETRON HCL 4 MG/2ML IJ SOLN: 4.0000 mg | Freq: Four times a day (QID) | INTRAMUSCULAR | Status: DC | PRN

## 2011-11-27 MED ORDER — ONDANSETRON HCL 4 MG PO TABS: 4.0000 mg | ORAL_TABLET | Freq: Four times a day (QID) | ORAL | Status: DC | PRN

## 2011-11-27 MED ORDER — SODIUM CHLORIDE 0.9 % IJ SOLN: 3.0000 mL | Freq: Two times a day (BID) | INTRAMUSCULAR | Status: DC

## 2011-11-27 MED ORDER — ZOLPIDEM TARTRATE 5 MG PO TABS: 5.0000 mg | ORAL_TABLET | Freq: Every evening | ORAL | Status: DC | PRN

## 2011-11-27 MED ORDER — INSULIN ASPART 100 UNIT/ML ~~LOC~~ SOLN: 0.0000 [IU] | Freq: Every day | SUBCUTANEOUS | Status: DC

## 2011-11-27 MED ORDER — ASPIRIN 81 MG PO CHEW
324.0000 mg | CHEWABLE_TABLET | Freq: Once | ORAL | Status: AC
  Administered 2011-11-27: 324 mg via ORAL
  Filled 2011-11-27: qty 4

## 2011-11-27 MED ORDER — INSULIN ASPART 100 UNIT/ML ~~LOC~~ SOLN
0.0000 [IU] | Freq: Three times a day (TID) | SUBCUTANEOUS | Status: DC
  Administered 2011-11-28: 5 [IU] via SUBCUTANEOUS
  Administered 2011-11-28: 11:00:00 via SUBCUTANEOUS

## 2011-11-27 MED ORDER — ASPIRIN EC 325 MG PO TBEC
325.0000 mg | DELAYED_RELEASE_TABLET | Freq: Every day | ORAL | Status: DC
  Administered 2011-11-28: 325 mg via ORAL
  Filled 2011-11-27: qty 1

## 2011-11-27 MED ORDER — NITROGLYCERIN 0.2 MG/HR TD PT24
0.2000 mg | MEDICATED_PATCH | Freq: Every day | TRANSDERMAL | Status: DC
  Administered 2011-11-28: 0.2 mg via TRANSDERMAL
  Filled 2011-11-27 (×2): qty 1

## 2011-11-27 MED ORDER — ACETAMINOPHEN 325 MG PO TABS: 650.0000 mg | ORAL_TABLET | Freq: Four times a day (QID) | ORAL | Status: DC | PRN

## 2011-11-27 MED ORDER — ACETAMINOPHEN 650 MG RE SUPP: 650.0000 mg | Freq: Four times a day (QID) | RECTAL | Status: DC | PRN

## 2011-11-27 MED ORDER — SODIUM CHLORIDE 0.9 % IV SOLN
Freq: Once | INTRAVENOUS | Status: AC
  Administered 2011-11-27: 23:00:00 via INTRAVENOUS

## 2011-11-27 MED ORDER — OXYCODONE HCL 5 MG PO TABS: 5.0000 mg | ORAL_TABLET | ORAL | Status: DC | PRN

## 2011-11-27 NOTE — H&P (Signed)
DATE OF ADMISSION:  11/27/2011  PCP:    Ginette Otto, MD   Chief Complaint:  Chest Pain   HPI: Christian Kaiser is an 77 y.o. male with Multiple Medical Problems including CAD, and Type II Diabetes who presents with complaints of intermittent Chest Pain since 12 noon.  He describes the pain as mid chest tightness associated with SOB, he denies any radiation of the pain.  The pain was relieved after he had been given SL NTG X 2.  He has a history of 2 previous CABGs, as well as a PTCA with Stent X 1.  His cardiologist is Dr. Verdis Prime.  He also has had increased blood sugars over the past week with glucose levels to 500.  He denies having any fevers chills or cough or dysuria.       Past Medical History  Diagnosis Date  . Diabetes mellitus   . Kidney stones     Past Surgical History  Procedure Date  . Coronary artery bypass graft   . Carotid stent   . Rotator cuff repair     Left  . Knee arthroscopy     Right    Medications:  HOME MEDS: Prior to Admission medications   Medication Sig Start Date End Date Taking? Authorizing Provider  aspirin EC 81 MG tablet Take 81 mg by mouth daily.   Yes Historical Provider, MD  felodipine (PLENDIL) 10 MG 24 hr tablet Take 5 mg by mouth daily.   Yes Historical Provider, MD  furosemide (LASIX) 20 MG tablet Take 20 mg by mouth daily.   Yes Historical Provider, MD  glimepiride (AMARYL) 4 MG tablet Take 4 mg by mouth 2 (two) times daily.   Yes Historical Provider, MD  insulin detemir (LEVEMIR) 100 UNIT/ML injection Inject 24 Units into the skin at bedtime.   Yes Historical Provider, MD  metFORMIN (GLUCOPHAGE) 1000 MG tablet Take 1,000 mg by mouth 2 (two) times daily with a meal.   Yes Historical Provider, MD  Multiple Vitamin (MULTIVITAMIN WITH MINERALS) TABS Take 1 tablet by mouth daily.   Yes Historical Provider, MD  Omega-3 Fatty Acids (FISH OIL) 1200 MG CAPS Take 1 capsule by mouth daily.   Yes Historical Provider, MD  simvastatin  (ZOCOR) 80 MG tablet Take 80 mg by mouth at bedtime.   Yes Historical Provider, MD  telmisartan (MICARDIS) 80 MG tablet Take 80 mg by mouth daily.   Yes Historical Provider, MD    Allergies:  No Known Allergies  Social History:   reports that he has never smoked. He does not have any smokeless tobacco history on file. He reports that he does not drink alcohol or use illicit drugs.  Family History: Family History  Problem Relation Age of Onset  . Cancer Father     Review of Systems:  The patient denies anorexia, fever, weight loss, vision loss, decreased hearing, hoarseness, syncope, dyspnea on exertion, peripheral edema, balance deficits, hemoptysis, abdominal pain, melena, hematochezia, severe indigestion/heartburn, hematuria, incontinence, genital sores, muscle weakness, suspicious skin lesions, transient blindness, difficulty walking, depression, unusual weight change, abnormal bleeding, enlarged lymph nodes, angioedema, and breast masses.   Physical Exam:  GEN:  Pleasant 77 year old Obese Well developed African American male  examined  and in no acute distress; cooperative with exam Filed Vitals:   11/27/11 1816 11/27/11 2023 11/27/11 2215 11/27/11 2240  BP:  108/68 124/81 128/76  Pulse:  65 61 69  Temp:  97.6 F (36.4 C)  TempSrc:  Oral    Resp:  18 13 14   Height: 6' (1.829 m)     Weight: 102.059 kg (225 lb)     SpO2:  98% 99% 100%   Blood pressure 128/76, pulse 69, temperature 97.6 F (36.4 C), temperature source Oral, resp. rate 14, height 6' (1.829 m), weight 102.059 kg (225 lb), SpO2 100.00%. PSYCH: He is alert and oriented x4; does not appear anxious does not appear depressed; affect is normal HEENT: Normocephalic and Atraumatic, Mucous membranes pink; PERRLA; EOM intact; Fundi:  Benign;  No scleral icterus, Nares: Patent, Oropharynx: Clear, Edentulous.   Neck:  FROM, no cervical lymphadenopathy nor thyromegaly or carotid bruit; no JVD; Breasts:: Not examined CHEST  WALL: No tenderness CHEST: Normal respiration, clear to auscultation bilaterally HEART: Regular rate and rhythm; no murmurs rubs or gallops BACK: No kyphosis or scoliosis; no CVA tenderness ABDOMEN: Positive Bowel Sounds, Obese, soft non-tender; no masses, No Organomegaly.   Rectal Exam: Not done EXTREMITIES: No bone or joint deformity; age-appropriate arthropathy of the hands and knees; no cyanosis, clubbing or edema; no ulcerations. Genitalia: not examined PULSES: 2+ and symmetric SKIN: Normal hydration no rash or ulceration CNS: Cranial nerves 2-12 grossly intact no focal neurologic deficit   Labs & Imaging Results for orders placed during the hospital encounter of 11/27/11 (from the past 48 hour(s))  CBC     Status: Abnormal   Collection Time   11/27/11  6:19 PM      Component Value Range Comment   WBC 6.7  4.0 - 10.5 K/uL    RBC 4.27  4.22 - 5.81 MIL/uL    Hemoglobin 12.1 (*) 13.0 - 17.0 g/dL    HCT 75.6 (*) 43.3 - 52.0 %    MCV 84.1  78.0 - 100.0 fL    MCH 28.3  26.0 - 34.0 pg    MCHC 33.7  30.0 - 36.0 g/dL    RDW 29.5  18.8 - 41.6 %    Platelets 158  150 - 400 K/uL   BASIC METABOLIC PANEL     Status: Abnormal   Collection Time   11/27/11  6:19 PM      Component Value Range Comment   Sodium 137  135 - 145 mEq/L    Potassium 4.5  3.5 - 5.1 mEq/L    Chloride 101  96 - 112 mEq/L    CO2 22  19 - 32 mEq/L    Glucose, Bld 394 (*) 70 - 99 mg/dL    BUN 30 (*) 6 - 23 mg/dL    Creatinine, Ser 6.06 (*) 0.50 - 1.35 mg/dL    Calcium 30.1  8.4 - 10.5 mg/dL    GFR calc non Af Amer 34 (*) >90 mL/min    GFR calc Af Amer 39 (*) >90 mL/min   PRO B NATRIURETIC PEPTIDE     Status: Abnormal   Collection Time   11/27/11  6:19 PM      Component Value Range Comment   Pro B Natriuretic peptide (BNP) 191.6 (*) 0 - 125 pg/mL   POCT I-STAT TROPONIN I     Status: Normal   Collection Time   11/27/11  6:34 PM      Component Value Range Comment   Troponin i, poc 0.00  0.00 - 0.08 ng/mL    Comment  3            CBC     Status: Abnormal   Collection Time   11/27/11 10:29  PM      Component Value Range Comment   WBC 6.7  4.0 - 10.5 K/uL    RBC 4.09 (*) 4.22 - 5.81 MIL/uL    Hemoglobin 11.9 (*) 13.0 - 17.0 g/dL    HCT 16.1 (*) 09.6 - 52.0 %    MCV 84.1  78.0 - 100.0 fL    MCH 29.1  26.0 - 34.0 pg    MCHC 34.6  30.0 - 36.0 g/dL    RDW 04.5  40.9 - 81.1 %    Platelets 137 (*) 150 - 400 K/uL   COMPREHENSIVE METABOLIC PANEL     Status: Abnormal   Collection Time   11/27/11 10:29 PM      Component Value Range Comment   Sodium 135  135 - 145 mEq/L    Potassium 4.6  3.5 - 5.1 mEq/L    Chloride 101  96 - 112 mEq/L    CO2 20  19 - 32 mEq/L    Glucose, Bld 247 (*) 70 - 99 mg/dL    BUN 28 (*) 6 - 23 mg/dL    Creatinine, Ser 9.14 (*) 0.50 - 1.35 mg/dL    Calcium 9.7  8.4 - 78.2 mg/dL    Total Protein 6.9  6.0 - 8.3 g/dL    Albumin 4.1  3.5 - 5.2 g/dL    AST 14  0 - 37 U/L    ALT 13  0 - 53 U/L    Alkaline Phosphatase 43  39 - 117 U/L    Total Bilirubin 0.2 (*) 0.3 - 1.2 mg/dL    GFR calc non Af Amer 41 (*) >90 mL/min    GFR calc Af Amer 48 (*) >90 mL/min   POCT I-STAT TROPONIN I     Status: Normal   Collection Time   11/27/11 10:47 PM      Component Value Range Comment   Troponin i, poc 0.00  0.00 - 0.08 ng/mL    Comment 3             Dg Chest 2 View  11/27/2011  *RADIOLOGY REPORT*  Clinical Data: Chest pain and hyperglycemia.  CHEST - 2 VIEW  Comparison: Chest x-ray 04/17/2009.  Findings: Lung volumes are normal.  No consolidative airspace disease.  No pleural effusions.  No pneumothorax.  No pulmonary nodule or mass noted.  Pulmonary vasculature and the cardiomediastinal silhouette are within normal limits. Atherosclerotic calcifications within the arch of the aorta.  The patient is status post median sternotomy for CABG with a LIMA.  IMPRESSION: 1.  No radiographic evidence of acute cardiopulmonary disease. 2.  Status post median sternotomy for CABG with LIMA. 3.  Atherosclerosis.   Original Report Authenticated By: Florencia Reasons, M.D.    EKG:  Unable to locate.     Assessment:  Present on Admission:  .Chest pain .Hypertension .ARF (acute renal failure) .Diabetes mellitus .Hyperglycemia .Hyperlipidemia .Anemia .CAD (coronary artery disease) .Morbid obesity   Plan:    Admit for 24 Hour Observation to Telemetry for Chest Pain Rule out CArdiac Enzymes q 8hrs X 3, Nitropatch, O2, and ASA therapy GEntle Rehydration REconcile Home Meds Diabetic Diet, and SSI coverage PRN Check Anemia Panel Order EKG Other plans as per orders.    CODE STATUS:      FULL CODE         Sharone Picchi C 11/27/2011, 11:47 PM

## 2011-11-27 NOTE — ED Notes (Signed)
Pt not in room at this time, awaiting pt.

## 2011-11-27 NOTE — ED Notes (Signed)
Pt denies any pain after nitro SL x 3, MD made aware. Pt is hypotensive and fluids will be started per [facility]. Plan of care is updated with verbal understanding, visitor at bedside and will continue to monitor pt.

## 2011-11-27 NOTE — ED Provider Notes (Signed)
History     CSN: [id number]  Arrival date & time 11/27/11  1804   First MD Initiated Contact with Patient 11/27/11 2150      Chief Complaint  Patient presents with  . Chest Pain  . Hyperglycemia    (Consider location/radiation/quality/duration/timing/severity/associated sxs/prior treatment) HPI  77 year old gentleman past medical history of diabetes kidney stones coronary artery bypass graft and coronary stent in today with a 7 hour history of intermittent chest pressure, shortness of breath, and lightheadedness. This started while he was working dragging limbs in the yard. His lightheadedness was made worse by bending over. He knows he is at very high blood sugars this week and has had his Lantus increased to times by his endocrinologist. He denies arm pain, jaw pain, headache, diaphoresis. Denies any recent coughing or illnesses. Denies any recent paroxysmal nocturnal dyspnea. Does endorse night sweats. His pain on arrival to 8/10. He makes better or worse. His vital signs were normal arrival.  Past Medical History  Diagnosis Date  . Diabetes mellitus   . Kidney stones     Past Surgical History  Procedure Date  . Coronary artery bypass graft   . Carotid stent   . Rotator cuff repair     Left  . Knee arthroscopy     Right    Family History  Problem Relation Age of Onset  . Cancer Father     History  Substance Use Topics  . Smoking status: Never Smoker   . Smokeless tobacco: Not on file  . Alcohol Use: No      Review of Systems Constitutional: Negative for fever and chills.  HENT: Negative for ear pain, sore throat and trouble swallowing.   Eyes: Negative for pain and visual disturbance.  Respiratory: Negative for cough and POS shortness of breath.   Cardiovascular:  POS for chest pain and leg swelling.  Gastrointestinal: Negative for nausea, vomiting, abdominal pain and diarrhea.  Genitourinary: Negative for dysuria, urgency and frequency.  Musculoskeletal:  Negative for back pain and joint swelling.  Skin: Negative for rash and wound.  Neurological: Negative for dizziness, POS lightheadedness, neg syncope, speech difficulty, weakness and numbness.   Allergies  Review of patient's allergies indicates no known allergies.  Home Medications   Current Outpatient Rx  Name Route Sig Dispense Refill  . ASPIRIN EC 81 MG PO TBEC Oral Take 81 mg by mouth daily.    Marland Kitchen FELODIPINE ER 10 MG PO TB24 Oral Take 5 mg by mouth daily.    . FUROSEMIDE 20 MG PO TABS Oral Take 20 mg by mouth daily.    Marland Kitchen GLIMEPIRIDE 4 MG PO TABS Oral Take 4 mg by mouth 2 (two) times daily.    . INSULIN DETEMIR 100 UNIT/ML [place] SOLN Subcutaneous Inject 24 Units into the skin at bedtime.    Marland Kitchen METFORMIN HCL 1000 MG PO TABS Oral Take 1,000 mg by mouth 2 (two) times daily with a meal.    . ADULT MULTIVITAMIN W/MINERALS CH Oral Take 1 tablet by mouth daily.    Marland Kitchen FISH OIL 1200 MG PO CAPS Oral Take 1 capsule by mouth daily.    Marland Kitchen SIMVASTATIN 80 MG PO TABS Oral Take 80 mg by mouth at bedtime.    . TELMISARTAN 80 MG PO TABS Oral Take 80 mg by mouth daily.      BP 107/72  Pulse 58  Temp 97.3 F (36.3 C) (Oral)  Resp 16  Ht 6' (1.829 m)  Wt 225 lb (102.059  kg)  BMI 30.52 kg/m2  SpO2 97%  Physical Exam Consitutional: Pt in no acute distress.   Head: Normocephalic and atraumatic.  Eyes: Extraocular motion intact, no scleral icterus Neck: Supple without meningismus, mass, or overt JVD Respiratory: Effort normal and breath sounds normal. No respiratory distress. CV: Heart regular rate and regular rhythm (sinus), no obvious murmurs.  Pulses +2 and symmetric Abdomen: Soft, non-tender, non-distended. No rebound or guarding.  MSK: Extremities are atraumatic without deformity, ROM intact Skin: Warm, dry, intact Neuro: Alert and oriented, no motor deficit noted.   Psychiatric: Mood and affect are normal  EKG:  Rate: 57 Rythym Sinus brady Interval 180 ms. Axis: normal No gross  conduction abnormalities appreciated.  No gross ST or T-wave abnormalities appreciated.  Sim to previous.    ED Course  Procedures (including critical care time)  Labs Reviewed  CBC - Abnormal; Notable for the following:    Hemoglobin 12.1 (*)     HCT 35.9 (*)     All other components within normal limits  BASIC METABOLIC PANEL - Abnormal; Notable for the following:    Glucose, Bld 394 (*)     BUN 30 (*)     Creatinine, Ser 1.86 (*)     GFR calc non Af Amer 34 (*)     GFR calc Af Amer 39 (*)     All other components within normal limits  PRO B NATRIURETIC PEPTIDE - Abnormal; Notable for the following:    Pro B Natriuretic peptide (BNP) 191.6 (*)     All other components within normal limits  CBC - Abnormal; Notable for the following:    RBC 4.09 (*)     Hemoglobin 11.9 (*)     HCT 34.4 (*)     Platelets 137 (*)     All other components within normal limits  COMPREHENSIVE METABOLIC PANEL - Abnormal; Notable for the following:    Glucose, Bld 247 (*)     BUN 28 (*)     Creatinine, Ser 1.58 (*)     Total Bilirubin 0.2 (*)     GFR calc non Af Amer 41 (*)     GFR calc Af Amer 48 (*)     All other components within normal limits  POCT I-STAT TROPONIN I  POCT I-STAT TROPONIN I  CARDIAC PANEL(CRET KIN+CKTOT+MB+TROPI)  BASIC METABOLIC PANEL  CBC  CARDIAC PANEL(CRET KIN+CKTOT+MB+TROPI)  HEMOGLOBIN A1C  VITAMIN B12  FOLATE  IRON AND TIBC  FERRITIN  RETICULOCYTES  CARDIAC PANEL(CRET KIN+CKTOT+MB+TROPI)   Dg Chest 2 View  11/27/2011  *RADIOLOGY REPORT*  Clinical Data: Chest pain and hyperglycemia.  CHEST - 2 VIEW  Comparison: Chest x-ray 04/17/2009.  Findings: Lung volumes are normal.  No consolidative airspace disease.  No pleural effusions.  No pneumothorax.  No pulmonary nodule or mass noted.  Pulmonary vasculature and the cardiomediastinal silhouette are within normal limits. Atherosclerotic calcifications within the arch of the aorta.  The patient is status post median  sternotomy for CABG with a LIMA.  IMPRESSION: 1.  No radiographic evidence of acute cardiopulmonary disease. 2.  Status post median sternotomy for CABG with LIMA. 3.  Atherosclerosis.  Original Report Authenticated By: Florencia Reasons, M.D.     1. Chest pain       MDM  Chest pain rule out. Not consistent with pulmonary embolus. Not consistent with dissection.  Patient's pain on arrival 8/10. This was relieved with the administration of 2 nitroglycerin.  Negative troponin. Admit to medicine  to rule out.        Larrie Kass, MD 11/28/11 [phone]

## 2011-11-27 NOTE — ED Notes (Signed)
Reports unable to control blood sugar at home over past week. Today he was doing yardwork and started to have midsternal CP that is decreased since onset but remains. Reports he feel mild SOB also. A&Ox4, resp e/u

## 2011-11-28 ENCOUNTER — Encounter (HOSPITAL_COMMUNITY): Payer: Self-pay | Admitting: Cardiology

## 2011-11-28 DIAGNOSIS — N179 Acute kidney failure, unspecified: Secondary | ICD-10-CM

## 2011-11-28 DIAGNOSIS — E1165 Type 2 diabetes mellitus with hyperglycemia: Secondary | ICD-10-CM

## 2011-11-28 DIAGNOSIS — D696 Thrombocytopenia, unspecified: Secondary | ICD-10-CM | POA: Diagnosis present

## 2011-11-28 DIAGNOSIS — E118 Type 2 diabetes mellitus with unspecified complications: Secondary | ICD-10-CM

## 2011-11-28 DIAGNOSIS — M311 Thrombotic microangiopathy: Secondary | ICD-10-CM

## 2011-11-28 DIAGNOSIS — R079 Chest pain, unspecified: Secondary | ICD-10-CM

## 2011-11-28 LAB — CBC
MCH: 28.8 pg (ref 26.0–34.0)
MCHC: 34.6 g/dL (ref 30.0–36.0)
MCV: 83.2 fL (ref 78.0–100.0)
Platelets: 119 10*3/uL — ABNORMAL LOW (ref 150–400)
RDW: 12.8 % (ref 11.5–15.5)

## 2011-11-28 LAB — GLUCOSE, CAPILLARY: Glucose-Capillary: 249 mg/dL — ABNORMAL HIGH (ref 70–99)

## 2011-11-28 LAB — FERRITIN: Ferritin: 371 ng/mL — ABNORMAL HIGH (ref 22–322)

## 2011-11-28 LAB — VITAMIN B12: Vitamin B-12: 649 pg/mL (ref 211–911)

## 2011-11-28 LAB — BASIC METABOLIC PANEL
BUN: 26 mg/dL — ABNORMAL HIGH (ref 6–23)
CO2: 22 mEq/L (ref 19–32)
Calcium: 9.2 mg/dL (ref 8.4–10.5)
Creatinine, Ser: 1.29 mg/dL (ref 0.50–1.35)
GFR calc non Af Amer: 53 mL/min — ABNORMAL LOW (ref >90)
Glucose, Bld: 291 mg/dL — ABNORMAL HIGH (ref 70–99)
Sodium: 135 mEq/L (ref 135–145)

## 2011-11-28 LAB — IRON AND TIBC
Iron: 40 ug/dL — ABNORMAL LOW (ref 42–135)
Saturation Ratios: 15 % — ABNORMAL LOW (ref 20–55)
TIBC: 265 ug/dL (ref 215–435)

## 2011-11-28 LAB — CARDIAC PANEL(CRET KIN+CKTOT+MB+TROPI)
CK, MB: 3 ng/mL (ref 0.3–4.0)
CK, MB: 2.6 ng/mL (ref 0.3–4.0)
Relative Index: 1.7 (ref 0.0–2.5)
Relative Index: 2 (ref 0.0–2.5)
Troponin I: <0.3 ng/mL (ref <0.30)
Troponin I: <0.3 ng/mL (ref <0.30)

## 2011-11-28 LAB — RETICULOCYTES: Retic Ct Pct: 1.1 % (ref 0.4–3.1)

## 2011-11-28 MED ORDER — ISOSORBIDE MONONITRATE ER 30 MG PO TB24
30.0000 mg | ORAL_TABLET | Freq: Every day | ORAL | Status: DC
  Administered 2011-11-28: 30 mg via ORAL
  Filled 2011-11-28: qty 1

## 2011-11-28 MED ORDER — FELODIPINE ER 5 MG PO TB24
5.0000 mg | ORAL_TABLET | Freq: Every day | ORAL | Status: DC
  Administered 2011-11-28: 5 mg via ORAL
  Filled 2011-11-28 (×2): qty 1

## 2011-11-28 MED ORDER — ATORVASTATIN CALCIUM 40 MG PO TABS
40.0000 mg | ORAL_TABLET | Freq: Every day | ORAL | Status: DC
  Administered 2011-11-28: 40 mg via ORAL
  Filled 2011-11-28: qty 1

## 2011-11-28 MED ORDER — INSULIN DETEMIR 100 UNIT/ML ~~LOC~~ SOLN
24.0000 [IU] | Freq: Every day | SUBCUTANEOUS | Status: DC
  Filled 2011-11-28: qty 10

## 2011-11-28 MED ORDER — NITROGLYCERIN 0.4 MG SL SUBL: 0.4000 mg | SUBLINGUAL_TABLET | SUBLINGUAL | Status: DC | PRN

## 2011-11-28 MED ORDER — GLIMEPIRIDE 4 MG PO TABS
4.0000 mg | ORAL_TABLET | Freq: Two times a day (BID) | ORAL | Status: DC
  Administered 2011-11-28: 4 mg via ORAL
  Filled 2011-11-28 (×2): qty 1

## 2011-11-28 MED ORDER — ISOSORBIDE MONONITRATE ER 30 MG PO TB24: 30.0000 mg | ORAL_TABLET | Freq: Every day | ORAL | Status: DC

## 2011-11-28 NOTE — Discharge Summary (Signed)
Physician Discharge Summary  Christian Kaiser [facility] ZOX:[id number] DOB: 1934-12-28 DOA: 11/27/2011  PCP: Ginette Otto, MD Cardiologist: Mendel Ryder, MD  Admit date: 11/27/2011 Discharge date: 11/28/2011  Recommendations for Outpatient Follow-up:  1. Follow-up chest pain--further recommendations deferred to primary cardiologist.  2. Consider repeat CBC in one week to followup thrombocytopenia.  Follow-up Information    Follow up with Ginette Otto, MD. (as needed)    Contact information:   [address] [place] [address] [place] [address] [phone]       Follow up with Lesleigh Noe, MD. Call in 2 days.   Contact information:   [address] [place] [address] [place] [address] [phone]         Discharge Diagnoses:  1. Chest pain/angina 2. Known coronary artery disease/history CABG 3. Acute renal failure, resolved 4. Thrombocytopenia, etiology unclear 5. Diabetes mellitus type 2, uncontrolled  Discharge Condition: Improved Disposition: Home  Diet recommendation: Heart healthy diabetic diet  History of present illness:  77 year old man presented to the emergency department with history chest pain, relieved with NTG.  Hospital Course:  Christian Kaiser was admitted to the medical floor for further evaluation. Cardiac enzymes were negative. Patient was seen in consultation with cardiology. Recommendations were to add Imdur, continue to hold ARB at this time and followup with cardiology next week. Intervention and stress testing was considered but deferred as the patient is not felt to be at high risk and the patient preferred to discuss further evaluation with his cardiologist in 2 days time. No beta blocker was prescribed given bradycardia. Acute renal failure resolved with IV fluids and brief hiatus from Lasix. Diabetes has been poorly controlled as an outpatient but the patient's primary care physician recently increased his dose of  long-acting insulin and therefore no changes were made at this time. 1. Chest pain: Ruled out with serial cardiac enzymes. As above.  2. Acute renal failure: Improved with IVF. Multifactorial: Lasix, telmisartan, yardwork/heat-induced dehydration.  3. Thrombocytopenia: Etiology and significance unclear. Followup as an outpatient 4. Diabetes mellitus type 2, uncontrolled: Recently increased to 24 units Lantus daily by PCP. Continue Levemir, Amaryl. HgbA1c 11.9.    5. Hypertension: Stable.  6. History of CAD/CABG: As above. Continue Plendil.  Consultants:  Cardiology  Procedures:  None  Discharge Instructions  Discharge Orders    Future Orders Please Complete By Expires   Diet - low sodium heart healthy      Diet Carb Modified      Activity as tolerated - No restrictions        Medication List  As of 11/28/2011  5:00 PM   STOP taking these medications         telmisartan 80 MG tablet         TAKE these medications         aspirin EC 81 MG tablet   Take 81 mg by mouth daily.      felodipine 10 MG 24 hr tablet   Commonly known as: PLENDIL   Take 5 mg by mouth daily.      Fish Oil 1200 MG Caps   Take 1 capsule by mouth daily.      furosemide 20 MG tablet   Commonly known as: LASIX   Take 20 mg by mouth daily.      glimepiride 4 MG tablet   Commonly known as: AMARYL   Take 4 mg by mouth 2 (two) times daily.      insulin  detemir 100 UNIT/ML injection   Commonly known as: LEVEMIR   Inject 24 Units into the skin at bedtime.      isosorbide mononitrate 30 MG 24 hr tablet   Commonly known as: IMDUR   Take 1 tablet (30 mg total) by mouth daily.      metFORMIN 1000 MG tablet   Commonly known as: GLUCOPHAGE   Take 1,000 mg by mouth 2 (two) times daily with a meal.      multivitamin with minerals Tabs   Take 1 tablet by mouth daily.      nitroGLYCERIN 0.4 MG SL tablet   Commonly known as: NITROSTAT   Place 1 tablet (0.4 mg total) under the tongue every 5 (five)  minutes as needed for chest pain.      simvastatin 80 MG tablet   Commonly known as: ZOCOR   Take 80 mg by mouth at bedtime.           The results of significant diagnostics from this hospitalization (including imaging, microbiology, ancillary and laboratory) are listed below for reference.    Significant Diagnostic Studies: Dg Chest 2 View  11/27/2011  *RADIOLOGY REPORT*  Clinical Data: Chest pain and hyperglycemia.  CHEST - 2 VIEW  Comparison: Chest x-ray 04/17/2009.  Findings: Lung volumes are normal.  No consolidative airspace disease.  No pleural effusions.  No pneumothorax.  No pulmonary nodule or mass noted.  Pulmonary vasculature and the cardiomediastinal silhouette are within normal limits. Atherosclerotic calcifications within the arch of the aorta.  The patient is status post median sternotomy for CABG with a LIMA.  IMPRESSION: 1.  No radiographic evidence of acute cardiopulmonary disease. 2.  Status post median sternotomy for CABG with LIMA. 3.  Atherosclerosis.  Original Report Authenticated By: Florencia Reasons, M.D.   Labs: Basic Metabolic Panel:  Lab 11/28/11 1610 11/27/11 2229 11/27/11 1819  NA 135 135 137  K 4.0 4.6 4.5  CL 101 101 101  CO2 22 20 22   GLUCOSE 291* 247* 394*  BUN 26* 28* 30*  CREATININE 1.29 1.58* 1.86*  CALCIUM 9.2 9.7 10.0  MG -- -- --  PHOS -- -- --   Liver Function Tests:  Lab 11/27/11 2229  AST 14  ALT 13  ALKPHOS 43  BILITOT 0.2*  PROT 6.9  ALBUMIN 4.1   CBC:  Lab 11/28/11 0515 11/27/11 2229 11/27/11 1819  WBC 5.2 6.7 6.7  NEUTROABS -- -- --  HGB 11.0* 11.9* 12.1*  HCT 31.8* 34.4* 35.9*  MCV 83.2 84.1 84.1  PLT 119* 137* 158   Cardiac Enzymes:  Lab 11/28/11 1455 11/28/11 0840 11/27/11 2333  CKTOTAL 152 160 164  CKMB 2.6 3.0 3.3  CKMBINDEX -- -- --  TROPONINI <0.30 <0.30 <0.30   BNP (last 3 results)  Basename 11/27/11 1819  PROBNP 191.6*   CBG:  Lab 11/28/11 1601 11/28/11 1049 11/28/11 0629 11/28/11 0135    GLUCAP 302* 269* 269* 249*    Principal Problem:  *Chest pain Active Problems:  ARF (acute renal failure)  CAD (coronary artery disease)  Thrombocytopenia  Hypertension  Diabetes mellitus type 2, uncontrolled   Time coordinating discharge: 20 minutes.  Signed:  Brendia Sacks, MD [facility] 11/28/2011, 4:53 PM

## 2011-11-28 NOTE — Consult Note (Signed)
CARDIOLOGY CONSULT NOTE  Patient ID: Christian Kaiser, MRN: [id number], DOB/AGE: 1934-07-13 77 y.o. Admit date: 11/27/2011 Date of Consult: 11/28/2011  Primary Physician: Christian Otto, MD Primary Cardiologist: Christian Ryder, MD  Chief Complaint: chest pain Reason for Consultation: chest pain  HPI: 77 y.o. male w/ PMHx significant for CAD s/p CABGs x2, DMII, and Carotid dz who presented to [facility] on 11/27/2011 with complaints of chest pain.  CABG first performed around 1990 with LIMA to LAD, SVG to the RCA, SVG to the circumflex/obtuse marginal. Ultimately he did require redo bypass surgery and the following grafts were performed: Left thigh endo vein harvesting with reversed saphenous vein graft to the OM and distal circumflex, reversed saphenous vein graft to the posterior descending, and preservation of the previously placed LIMA artery in June of 2006. In 2007 he underwent cardiac cath for chest pain with abnormal stress test revealing patent grafts. He has a h/o asymptomatic carotid artery disease with last carotid doppler 2012 revealing <40% RICA stenosis and 40-60% LICA stenosis with plans for continued surveillance.  Christian Kaiser reports having a stress test performed at Christian Kaiser office last year after experiencing chest pain and thinks it was a normal exam. Since that time has not experienced chest pain until yesterday when he was helping his son remove trees from the yard. He had sudden onset substernal chest tightness associated with diaphoresis and sob. He rested with some improvement in pain, but it returned with exertion. He reports that he felt poorly mid week due to his blood sugar being in the 500s for which his PCP increased his insulin. He also reports normally being able to walk a couple miles 3-4 days a week without chest pain or sob, but yesterday prior to working in the yard, he was unable to walk as far due to DOE. He denies palpitations, syncope, orthopnea, edema,  focal neuro deficits, recent illness, fever, cough, abd pain, prolonged immobilization/travel, change in bladder/bowel habits. He does report ~9lb unintentional weight loss from Nov to May of this year as well as night sweats.  In the ED his chest pain was relieved with SL NTG. EKG showed Sinus rhythm, 74bpm, PVC, incomplete RBBB (new), nonspecific inferior ST/T changes. CXR without acute cardiopulmonary findings. Labs were significant for normal poc troponin, BNP 191, Crt 1.86, WBC 6.7, Hgb 12.1, A1C 11.9. He was not tachycardic or hypoxic. He was admitted by internal medicine for further evaluation and treatment. Cardiac enzymes have been cycled and normal x 2. EKG has remained unchanged. Renal insufficiency is improving with IVF. He had two episodes of sharp chest pain this morning, but is currently chest pain free.   Past Medical History  Diagnosis Date  . Diabetes mellitus     type 2  . Kidney stones   . HTN (hypertension)   . HLD (hyperlipidemia)   . CAD (coronary artery disease)     s/p 4v CABG 1990s and redo CABG 2006; cath 2007 showed patent grafts  . Carotid disease, bilateral     06/2010 carotid doppler <40% RICA stenosis and 40-60% LICA stenosis  . Gastric ulcer 2003  . Diverticulitis   . Anal fissure     s/p repair  . Shingles   . Cervical spondylosis   . Sacroiliitis      08/18/2005 - Cardiac Cath 1. Hemodynamic data:   Aortic pressure 127/76.  Left ventricular pressure was 28/6 mmHg.  2. Left ventriculography: Low normal and estimated to be 45-55%. No obvious  mitral regurgitation is noted. Surprisingly, with two previous lateral  wall infarction, the patient's lateral wall motion is relatively normal.  3. Coronary angiography:  1. Left main coronary: Totally occluded.  2. The left anterior descending coronary: Occluded.  3. The circumflex artery: Occluded.  4. The right coronary artery: Totally occluded proximally.  4. Saphenous vein graft angiography.  1. The  sequential saphenous vein graft to the second and third obtuse marginal branches is widely patent. Retrograde filling of the first obtuse marginal is late and there is disease at the origin from the proximal circumflex that causes this territory to be potentially ischemic. There is not a way to reach this territory for PCI purposes. There is also disease in the distal circumflex, obtuse marginal #4 territory that is also a threat but without an opportunity to be reached and potentially exempted.  2. Saphenous vein graft to the right coronary: Widely patent.  5. Internal mammary graft angiography: This graft is widely patent. Runoff into the native LAD was now well visualized but flow antegrade from the graft insertion site is normal. There is noobstructive disease noted and there is no midvessel obstructive disease noted proximal to the graft insertion site.  6. Total occlusion of the left main and right coronary ostium.  7. Mild decreased to low normal left ventricular function, EF 45-50%  PLAN: Medical therapy.  Surgical History:  Past Surgical History  Procedure Date  . Coronary artery bypass graft     1990s (SVG OM2/OM3, SVG to RCA, LIMA to LAD) and redo 2006 (reverse SVT to OM & distal LCx, reverse SVG to PDA, preservation of LIMA)  . Rotator cuff repair     Left  . Knee arthroscopy     Right     Home Meds: Medication Sig  aspirin EC 81 MG tablet Take 81 mg by mouth daily.  felodipine (PLENDIL) 10 MG 24 hr tablet Take 5 mg by mouth daily.  furosemide (LASIX) 20 MG tablet Take 20 mg by mouth daily.  glimepiride (AMARYL) 4 MG tablet Take 4 mg by mouth 2 (two) times daily.  insulin detemir (LEVEMIR) 100 UNIT/ML injection Inject 24 Units into the skin at bedtime.  metFORMIN (GLUCOPHAGE) 1000 MG tablet Take 1,000 mg by mouth 2 (two) times daily with a meal.  Multiple Vitamin (MULTIVITAMIN WITH MINERALS) TABS Take 1 tablet by mouth daily.  Omega-3 Fatty Acids (FISH OIL) 1200 MG CAPS Take 1  capsule by mouth daily.  simvastatin (ZOCOR) 80 MG tablet Take 80 mg by mouth at bedtime.  telmisartan (MICARDIS) 80 MG tablet Take 80 mg by mouth daily.    Inpatient Medications:  . sodium chloride   Intravenous Once  . acetaminophen  650 mg Oral Once  . aspirin  324 mg Oral Once  . aspirin EC  325 mg Oral Daily  . atorvastatin  40 mg Oral q1800  . enoxaparin  40 mg Subcutaneous Daily  . felodipine  5 mg Oral Daily  . glimepiride  4 mg Oral BID WC  . insulin aspart  0-5 Units Subcutaneous QHS  . insulin aspart  0-9 Units Subcutaneous TID WC  . insulin detemir  24 Units Subcutaneous QHS    Allergies: No Known Allergies  History   Social History  . Marital Status: Married    Spouse Name: N/A    Number of Children: N/A  . Years of Education: N/A   Occupational History  . Not on file.   Social History Main Topics  .  Smoking status: Never Smoker   . Smokeless tobacco: Not on file  . Alcohol Use: No  . Drug Use: No  . Sexually Active:    Other Topics Concern  . Not on file   Social History Narrative  . No narrative on file     Family History  Problem Relation Age of Onset  . Cancer Father      Review of Systems: General: (+) night sweats, weight loss; negative for chills, fever  Cardiovascular: As per HPI Dermatological: negative for rash Respiratory: negative for cough or wheezing Urologic: negative for hematuria Abdominal: negative for nausea, vomiting, diarrhea, bright red blood per rectum, melena, or hematemesis Neurologic: negative for visual changes, syncope, or dizziness All other systems reviewed and are otherwise negative except as noted above.  Labs:  [facility] 11/28/11 0840 11/27/11 2333  CKTOTAL 160 164  CKMB 3.0 3.3  TROPONINI <0.30 <0.30   Lab Results  Component Value Date   WBC 5.2 11/28/2011   HGB 11.0* 11/28/2011   HCT 31.8* 11/28/2011   MCV 83.2 11/28/2011   PLT 119* 11/28/2011     Lab 11/28/11 0515 11/27/11 2229  NA 135 --  K 4.0  --  CL 101 --  CO2 22 --  BUN 26* --  CREATININE 1.29 --  CALCIUM 9.2 --  PROT -- 6.9  BILITOT -- 0.2*  ALKPHOS -- 43  ALT -- 13  AST -- 14  GLUCOSE 291* --     11/27/2011 23:34  Hemoglobin A1C 11.9 (H)   Radiology/Studies:   11/27/2011 - Chest 2 View Findings: Lung volumes are normal.  No consolidative airspace disease.  No pleural effusions.  No pneumothorax.  No pulmonary nodule or mass noted.  Pulmonary vasculature and the cardiomediastinal silhouette are within normal limits. Atherosclerotic calcifications within the arch of the aorta.  The patient is status post median sternotomy for CABG with a LIMA.  IMPRESSION: 1.  No radiographic evidence of acute cardiopulmonary disease. 2.  Status post median sternotomy for CABG with LIMA. 3.  Atherosclerosis.     EKG: 11/27/11 @ 1809 - Sinus rhythm, 74bpm, PVC, incomplete RBBB(new), nonspecific inferior ST/T changes  Physical Exam: Blood pressure 117/68, pulse 58, temperature 98.4 F (36.9 C), temperature source Oral, resp. rate 18, height 6' (1.829 m), weight 221 lb 11.2 oz (100.562 kg), SpO2 99.00%. General: Well developed, well nourished, elderly black male in no acute distress. Head: Normocephalic, atraumatic, sclera non-icteric, no xanthomas, nares are without discharge.  Neck: Supple. Negative for carotid bruits. JVD not elevated. Lungs: Clear bilaterally to auscultation without wheezes, rales, or rhonchi. Breathing is unlabored. Heart: RRR with S1 S2. No murmurs, rubs, or gallops appreciated. Abdomen: Soft, non-tender, non-distended with normoactive bowel sounds. No hepatomegaly. No rebound/guarding. No obvious abdominal masses. Msk:  Strength and tone appear normal for age. Extremities: No clubbing or cyanosis. No edema.  Distal pedal pulses are 2+ and equal bilaterally. Neuro: Alert and oriented X 3. Moves all extremities spontaneously. Psych:  Responds to questions appropriately with a normal affect.   Assessment and Plan:    77 y.o. male w/ PMHx significant for CAD s/p CABGs x2, DMII, and Carotid dz who presented to [facility] on 11/27/2011 with complaints of chest pain.  1. Chest Pain: Extensive cardiac history with CABGs x2. He presents with chest pain with exertion, relieved with rest/NTG that started >24hrs ago. Cardiac enzymes normal, EKG with new incomplete RBBB(new) and nonspecific inferior ST/T changes. CXR unremarkable. No infectious symptoms. Do not  suspect PE. He has uncontrolled diabetes with recent elevation in blood sugars in the 500s. Per pt report, he had a normal stress test last year, but I can not see these results. His symptoms are concerning for angina, but do not suspect ACS. Discussed cath (Crt improving) vs stress vs medical management. Patient does not want cath and would like to discuss further plans with Dr. Katrinka Blazing. Would not start BB with bradycardia. Add Imdur 30mg  daily. Needs improved glucose control.   2. CAD: S/p CABGs x2. Last cath 2007 revealed patent stents. Patient reports nl stress test last year. Plan as above.  3. Diabetes Mellitus, Type 2: Uncontrolled with A1C 11.9. Reports recent blood sugars in the 500s for which his Levemir was increased. CBGs in the mid 200s now.  4. HTN: BP stable. ARB & Lasix held due to ARF. Bedside echo per Dr. Andee Lineman showed inferoposterior akinesis with scar, EF 45%. Would continue lasix when crt improves. Cont to hold ARB and discuss continuation as an outpatient  5. HLD: Lipid panel in the am. Could switch to Lipitor or Crestor if LDL not at goal.  6. Carotid Dz: Last carotid doppler 2012 revealed <40% RICA stenosis and 40-60% LICA stenosis with plans for continued surveillance. No bruit on exam. No neuro symptoms   Signed, HOPE, JESSICA PA-C 11/28/2011, 2:32 PM  I have seen and examined the patient.  Agree with the history and physical as outlined above by Bonita Quin PA-C The patient does report a history of recurrent angina.  He reports  exertional chest pain and had an episode of substernal chest pressure that prompted him to come to the hospital.  However he did not use any nitroglycerin at home.  He was given 2 nitroglycerin in the emergency room with prompt relief of his chest pressure. By electrocardiogramy there has been some nonspecific changes in the inferior leads.  However I did a bedside echocardiogram and the patient has a dense inferior/posterior scar akinesis.  I suspect this accounts for the changes in electrocardiogram in the inferior leads.  His ejection fraction is approximately 45% without significant valvular pathology.patient's point-of-care cardiac troponins were within normal limits.  Also in the hospital for subsequent sets were negative.  The patient today was walking the hall.  He reports no recurrent chest pain. I had a long discussion with the patient and his wife regarding possible strategies.  I suggested to them that we would increase his medical regimen and add Imdur 30 min. By mouth daily and instructed him also to use when necessary nitroglycerin if needed at home.  At this point I do not feel that the patient is high risk or has unstable symptoms.  The patient is very eager and anxious to go home and his wife wants him to be home for Father's Day.  He also discuss any further plans with her primary cardiologist Dr. Katrinka Blazing.  I certainly did not strongly encourage a cardiac catheterization at this point in time.  The patient would be at risk for contrast-induced nephropathy because is a diabetic and on an ARB he had mild to moderate renal insufficiency.  After holding ARB is renal insufficiency has improved and I told the patient and his wife that I would hold off on the drugs for now until they see Dr. Katrinka Blazing back again and also monitor his blood pressure closely to make sure that he does not have low blood pressures.  Given his LV dysfunction and assuming that she will resume a  regular diet and we will continue the  patient's Lasix for now.  We also made no other changes in his medical regimen short of agreeing with holding ARB. The patient was instructed to come back to the emergency room has any recurrent chest pain was unresponsive to one or 2 sublingual nitroglycerin and occurs at rest. The patient states that he will make an appointment on Monday with Dr. Katrinka Blazing and I would assume he would be considered for a stress test in the near future.  On the other hand if has recurrent chest pain cardiac catheterization can be considered.  Alvin Critchley [facility] 11/28/2011 3:43 PM

## 2011-11-28 NOTE — Progress Notes (Signed)
RN making round. Pt states that he just experienced  a very short episode of sharp lower mid sternal chest pain. Pain is  resolved at this time. EKG obtained. VS T-98.1 P-51 O2-99% on RA B/P- 109/66. Explained to pt to please call RN immediately for any further episodes of chest pain. Pt verbalized an understanding of above. MD made aware. No new orders at this time. Will continue to monitor. Sharlene Dory, RN

## 2011-11-28 NOTE — ED Notes (Signed)
Pt resting quietly, denies any pain or complaints at this time. Family remains at bedside,plan of care is updated with verbal understanding and will continue to monitor pt. Pt is awaiting transport to inpt bed assignment.

## 2011-11-28 NOTE — Progress Notes (Signed)
[facility] PROGRESS NOTE  Christian Kaiser:[id number] DOB: [birth year] DOA: 11/27/2011 PCP: Ginette Otto, MD Cardiologist: Mendel Ryder, MD  Assessment/Plan: 1. Chest pain: Typical/atypical features. Currently pain free. Cardiac enzymes negative thus far. Continue aspirin, NTG as needed. Cardiology consult. Not on a beta-blocker as an outpatient. 2. Acute renal failure: Improved with IVF. Multifactorial: Lasix, telmisartan, yardwork/heat-induced dehydration. 3. Thrombocytopenia: Etiology and significance unclear. Repeat CBC in am. 4. Diabetes mellitus type 2, uncontrolled: Recently increased to 24 units Lantus daily by PCP. Continue Lantus, Amaryl, SSI. HgbA1c 11.9.  5. Hypertension: Stable. Resume Telmisartan and Lasix when creatinine improved. 6. History of CAD/CABG: As above. Continue Plendil.  Code Status: Full code  Family Communication: Discussed with daughter-in-law at bedside Disposition Plan: Home when improved.  Brendia Sacks, MD  [facility] Pager [phone]. If 8PM-8AM, please contact night-coverage at [email or web address], password [facility] 11/28/2011, 10:03 AM  LOS: 1 day   Brief narrative: 77 year old man presented to the emergency department with history chest pain, relieved with NTG.  Chart Review:  08/2005 LHC--left main coronary totally occluded, LAD occluded, circumflex occluded, RCA occluded promixally. Medical therapy recommneded.  Consultants:  Cardiology  Procedures:    HPI/Subjective: Afebrile, vitals stable. Chest pain early this morning, transient. No complaints now.  Objective: Filed Vitals:   11/28/11 0051 11/28/11 0052 11/28/11 0146 11/28/11 0625  BP:  107/72 125/67 117/68  Pulse:   63 58  Temp: 98.3 F (36.8 C) 97.3 F (36.3 C) 97.7 F (36.5 C) 98.4 F (36.9 C)  TempSrc: Oral Oral Oral Oral  Resp:  16 16 18   Height:   6' (1.829 m)   Weight:   101.424 kg (223 lb 9.6 oz) 100.562 kg (221 lb 11.2 oz)  SpO2:  97% 99% 99%      Intake/Output Summary (Last 24 hours) at 11/28/11 1003 Last data filed at 11/28/11 0920  Gross per 24 hour  Intake    240 ml  Output   1125 ml  Net   -885 ml    Exam:   General:  Appears calm and comfortable  Cardiovascular: RRR, no m/r/g, no lower extremity edema  Respiratory: CTA bilaterally, no w/r/r. Normal respiratory effort.  Data Reviewed: Basic Metabolic Panel:  Lab 11/28/11 8295 11/27/11 2229 11/27/11 1819  NA 135 135 137  K 4.0 4.6 4.5  CL 101 101 101  CO2 22 20 22   GLUCOSE 291* 247* 394*  BUN 26* 28* 30*  CREATININE 1.29 1.58* 1.86*  CALCIUM 9.2 9.7 10.0  MG -- -- --  PHOS -- -- --   Liver Function Tests:  Lab 11/27/11 2229  AST 14  ALT 13  ALKPHOS 43  BILITOT 0.2*  PROT 6.9  ALBUMIN 4.1   CBC:  Lab 11/28/11 0515 11/27/11 2229 11/27/11 1819  WBC 5.2 6.7 6.7  NEUTROABS -- -- --  HGB 11.0* 11.9* 12.1*  HCT 31.8* 34.4* 35.9*  MCV 83.2 84.1 84.1  PLT 119* 137* 158   Cardiac Enzymes:  Lab 11/28/11 0840 11/27/11 2333  CKTOTAL 160 164  CKMB 3.0 3.3  CKMBINDEX -- --  TROPONINI <0.30 <0.30   BNP (last 3 results)  Basename 11/27/11 1819  PROBNP 191.6*   CBG:  Lab 11/28/11 0629 11/28/11 0135  GLUCAP 269* 249*   Studies: Dg Chest 2 View  11/27/2011  *RADIOLOGY REPORT*   IMPRESSION: 1.  No radiographic evidence of acute cardiopulmonary disease. 2.  Status post median sternotomy for CABG with LIMA. 3.  Atherosclerosis.  Original Report Authenticated  By: Florencia Reasons, M.D.    Scheduled Meds:   . sodium chloride   Intravenous Once  . acetaminophen  650 mg Oral Once  . aspirin  324 mg Oral Once  . aspirin EC  325 mg Oral Daily  . enoxaparin  40 mg Subcutaneous Daily  . insulin aspart  0-5 Units Subcutaneous QHS  . insulin aspart  0-9 Units Subcutaneous TID WC  . nitroGLYCERIN  0.2 mg Transdermal Daily  . DISCONTD: sodium chloride  3 mL Intravenous Q12H   Continuous Infusions:   . sodium chloride 75 mL/hr at 11/28/11 0142     EKGs --6/15 0402: SR, no acute changes. --6/15 0047: SR, no acute changes. --6/14: SR, PVC, no acute changes.  Principal Problem:  *Chest pain Active Problems:  ARF (acute renal failure)  CAD (coronary artery disease)  Thrombocytopenia  Hypertension  Diabetes mellitus type 2, uncontrolled

## 2011-11-28 NOTE — ED Provider Notes (Signed)
I saw and evaluated the patient, reviewed the resident's note and I agree with the findings and plan. I reviewed and interpreted the EKG during the patient's evaluation in the ED and agree with the resident's interpretation.   Pt with known history of CAD presents with symptoms concerning for unstable angina.  No evidence of STEMI on EKG.  Will admit for further evaluation and treatment.  Celene Kras, MD 11/28/11 [phone]

## 2011-11-30 LAB — GLUCOSE, CAPILLARY

## 2012-01-14 ENCOUNTER — Encounter: Payer: Self-pay | Admitting: Vascular Surgery

## 2012-07-12 ENCOUNTER — Telehealth: Payer: Self-pay | Admitting: Vascular Surgery

## 2012-07-12 NOTE — Telephone Encounter (Signed)
Follow-up call to patient regarding the chart audit letter.   He doesn't wish to schedule an appointment with [place]. His carotid ultrasound is not being followed.  Offered our services in the future should he change his mind. Juliette Alcide

## 2012-10-11 ENCOUNTER — Ambulatory Visit
Admission: RE | Admit: 2012-10-11 | Discharge: 2012-10-11 | Disposition: A | Discharge status: Home / Self Care | Payer: Medicare Other | Source: Ambulatory Visit | Attending: Geriatric Medicine | Admitting: Geriatric Medicine

## 2012-10-11 ENCOUNTER — Other Ambulatory Visit: Payer: Self-pay | Admitting: Geriatric Medicine

## 2012-10-11 DIAGNOSIS — R109 Unspecified abdominal pain: Secondary | ICD-10-CM

## 2012-10-11 MED ORDER — IOHEXOL 300 MG/ML  SOLN
125.0000 mL | Freq: Once | INTRAMUSCULAR | Status: AC | PRN
  Administered 2012-10-11: 125 mL via INTRAVENOUS

## 2012-10-11 MED ORDER — IOHEXOL 300 MG/ML  SOLN
30.0000 mL | Freq: Once | INTRAMUSCULAR | Status: AC | PRN
  Administered 2012-10-11: 30 mL via ORAL

## 2012-10-13 ENCOUNTER — Other Ambulatory Visit (HOSPITAL_COMMUNITY): Payer: Self-pay | Admitting: Geriatric Medicine

## 2012-10-13 DIAGNOSIS — C259 Malignant neoplasm of pancreas, unspecified: Secondary | ICD-10-CM

## 2012-10-13 DIAGNOSIS — R1900 Intra-abdominal and pelvic swelling, mass and lump, unspecified site: Secondary | ICD-10-CM

## 2012-10-13 DIAGNOSIS — K8689 Other specified diseases of pancreas: Secondary | ICD-10-CM

## 2012-10-13 HISTORY — DX: Malignant neoplasm of pancreas, unspecified: C25.9

## 2012-10-14 ENCOUNTER — Other Ambulatory Visit: Payer: Self-pay | Admitting: Radiology

## 2012-10-14 ENCOUNTER — Encounter (HOSPITAL_COMMUNITY): Payer: Self-pay | Admitting: Pharmacy Technician

## 2012-10-18 ENCOUNTER — Ambulatory Visit (HOSPITAL_COMMUNITY)
Admission: RE | Admit: 2012-10-18 | Discharge: 2012-10-18 | Disposition: A | Discharge status: Home / Self Care | Payer: Medicare Other | Source: Ambulatory Visit | Attending: Geriatric Medicine | Admitting: Geriatric Medicine

## 2012-10-18 ENCOUNTER — Encounter (HOSPITAL_COMMUNITY): Payer: Self-pay

## 2012-10-18 DIAGNOSIS — Z951 Presence of aortocoronary bypass graft: Secondary | ICD-10-CM | POA: Insufficient documentation

## 2012-10-18 DIAGNOSIS — R1900 Intra-abdominal and pelvic swelling, mass and lump, unspecified site: Secondary | ICD-10-CM

## 2012-10-18 DIAGNOSIS — I6529 Occlusion and stenosis of unspecified carotid artery: Secondary | ICD-10-CM | POA: Insufficient documentation

## 2012-10-18 DIAGNOSIS — I2510 Atherosclerotic heart disease of native coronary artery without angina pectoris: Secondary | ICD-10-CM | POA: Insufficient documentation

## 2012-10-18 DIAGNOSIS — E785 Hyperlipidemia, unspecified: Secondary | ICD-10-CM | POA: Insufficient documentation

## 2012-10-18 DIAGNOSIS — Z8711 Personal history of peptic ulcer disease: Secondary | ICD-10-CM | POA: Insufficient documentation

## 2012-10-18 DIAGNOSIS — C787 Secondary malignant neoplasm of liver and intrahepatic bile duct: Secondary | ICD-10-CM | POA: Insufficient documentation

## 2012-10-18 DIAGNOSIS — K5730 Diverticulosis of large intestine without perforation or abscess without bleeding: Secondary | ICD-10-CM | POA: Insufficient documentation

## 2012-10-18 DIAGNOSIS — K869 Disease of pancreas, unspecified: Secondary | ICD-10-CM | POA: Insufficient documentation

## 2012-10-18 DIAGNOSIS — I10 Essential (primary) hypertension: Secondary | ICD-10-CM | POA: Insufficient documentation

## 2012-10-18 DIAGNOSIS — M47812 Spondylosis without myelopathy or radiculopathy, cervical region: Secondary | ICD-10-CM | POA: Insufficient documentation

## 2012-10-18 DIAGNOSIS — E119 Type 2 diabetes mellitus without complications: Secondary | ICD-10-CM | POA: Insufficient documentation

## 2012-10-18 DIAGNOSIS — K8689 Other specified diseases of pancreas: Secondary | ICD-10-CM

## 2012-10-18 DIAGNOSIS — Z87442 Personal history of urinary calculi: Secondary | ICD-10-CM | POA: Insufficient documentation

## 2012-10-18 LAB — CBC
HCT: 37 % — ABNORMAL LOW (ref 39.0–52.0)
Hemoglobin: 12.8 g/dL — ABNORMAL LOW (ref 13.0–17.0)
MCH: 28 pg (ref 26.0–34.0)
MCHC: 34.6 g/dL (ref 30.0–36.0)
MCV: 81 fL (ref 78.0–100.0)

## 2012-10-18 LAB — PROTIME-INR: Prothrombin Time: 12.9 seconds (ref 11.6–15.2)

## 2012-10-18 LAB — GLUCOSE, CAPILLARY
Glucose-Capillary: 143 mg/dL — ABNORMAL HIGH (ref 70–99)
Glucose-Capillary: 137 mg/dL — ABNORMAL HIGH (ref 70–99)

## 2012-10-18 MED ORDER — HYDROCODONE-ACETAMINOPHEN 5-325 MG PO TABS
ORAL_TABLET | ORAL | Status: AC
  Filled 2012-10-18: qty 1

## 2012-10-18 MED ORDER — FENTANYL CITRATE 0.05 MG/ML IJ SOLN
INTRAMUSCULAR | Status: AC
  Filled 2012-10-18: qty 2

## 2012-10-18 MED ORDER — MIDAZOLAM HCL 2 MG/2ML IJ SOLN
INTRAMUSCULAR | Status: AC
  Filled 2012-10-18: qty 4

## 2012-10-18 MED ORDER — FENTANYL CITRATE 0.05 MG/ML IJ SOLN
INTRAMUSCULAR | Status: AC | PRN
  Administered 2012-10-18: 50 ug via INTRAVENOUS

## 2012-10-18 MED ORDER — SODIUM CHLORIDE 0.9 % IV SOLN: Freq: Once | INTRAVENOUS | Status: DC

## 2012-10-18 MED ORDER — MIDAZOLAM HCL 2 MG/2ML IJ SOLN
INTRAMUSCULAR | Status: AC | PRN
  Administered 2012-10-18: 1 mg via INTRAVENOUS

## 2012-10-18 MED ORDER — HYDROCODONE-ACETAMINOPHEN 5-325 MG PO TABS
1.0000 | ORAL_TABLET | ORAL | Status: DC | PRN
  Administered 2012-10-18: 1 via ORAL

## 2012-10-18 NOTE — Progress Notes (Signed)
PER DR HOSS OK TO D/C AT 1830

## 2012-10-18 NOTE — H&P (Signed)
Christian Kaiser is an 78 y.o. male.   Chief Complaint: Abd pain x 1 mo Worsening over last week CT reveals pancreatic and liver lesions Scheduled now for liver lesion biopsy HPI: DM; renal stones; HTN; HLD; CAD/CABG x 2; divertic; cervical spondylosis  Past Medical History  Diagnosis Date  . Diabetes mellitus     type 2  . Kidney stones   . HTN (hypertension)   . HLD (hyperlipidemia)   . CAD (coronary artery disease)     s/p 4v CABG 1990s and redo CABG 2006; cath 2007 showed patent grafts  . Carotid disease, bilateral     06/2010 carotid doppler <40% RICA stenosis and 40-60% LICA stenosis  . Gastric ulcer 2003  . Diverticulitis   . Anal fissure     s/p repair  . Shingles   . Cervical spondylosis   . Sacroiliitis     Past Surgical History  Procedure Laterality Date  . Coronary artery bypass graft      1990s (SVG OM2/OM3, SVG to RCA, LIMA to LAD) and redo 2006 (reverse SVT to OM & distal LCx, reverse SVG to PDA, preservation of LIMA)  . Rotator cuff repair      Left  . Knee arthroscopy      Right    Family History  Problem Relation Age of Onset  . Cancer Father    Social History:  reports that he quit smoking about 10 years ago. He does not have any smokeless tobacco history on file. He reports that he does not drink alcohol or use illicit drugs.  Allergies: No Known Allergies   (Not in a hospital admission)  Results for orders placed during the hospital encounter of 10/18/12 (from the past 48 hour(s))  GLUCOSE, CAPILLARY     Status: Abnormal   Collection Time    10/18/12 12:44 PM      Result Value Range   Glucose-Capillary 143 (*) 70 - 99 mg/dL   No results found.  Review of Systems  Constitutional: Negative for fever.  Respiratory: Negative for shortness of breath.   Cardiovascular: Positive for chest pain.  Gastrointestinal: Positive for abdominal pain. Negative for nausea and vomiting.  Neurological: Negative for dizziness, weakness and headaches.     Blood pressure 152/85, pulse 69, temperature 97.1 F (36.2 C), temperature source Oral, resp. rate 18, height 6\' 1"  (1.854 m), weight 211 lb (95.709 kg), SpO2 96.00%. Physical Exam  Constitutional: He is oriented to person, place, and time. He appears well-developed and well-nourished.  Cardiovascular: Normal rate.   No murmur heard. Irreg  Respiratory: Effort normal and breath sounds normal. He has no wheezes.  GI: Soft. Bowel sounds are normal. There is no tenderness.  Musculoskeletal: Normal range of motion.  Neurological: He is alert and oriented to person, place, and time.  Skin: Skin is warm and dry.  Psychiatric: He has a normal mood and affect. His behavior is normal. Judgment and thought content normal.     Assessment/Plan abd pain CT shows pancreatic and liver lesions Scheduled for liver lesion biopsy Pt aware of procedure benefits and risks and agreeable to proceed consent signed and in chart  Lainey Nelson A 10/18/2012, 12:58 PM

## 2012-10-18 NOTE — Procedures (Signed)
Liver Bx R lobe lesion No comp

## 2012-10-19 ENCOUNTER — Telehealth: Payer: Self-pay | Admitting: Oncology

## 2012-10-19 NOTE — Telephone Encounter (Signed)
LVOM FOR PT INFORMING PT OF NP APPT ON 05/08 @ 9 W/DR. SHERRILL.

## 2012-10-19 NOTE — Telephone Encounter (Signed)
C/D 10/19/12 for appt. 10/20/12

## 2012-10-19 NOTE — Telephone Encounter (Signed)
This RN has attempted to reach patient by phone several times on 5/6 and today without success.  Will continue to try and contact and give appointment with Dr. Truett Perna.

## 2012-10-20 ENCOUNTER — Encounter: Payer: Self-pay | Admitting: Oncology

## 2012-10-20 ENCOUNTER — Ambulatory Visit (HOSPITAL_BASED_OUTPATIENT_CLINIC_OR_DEPARTMENT_OTHER): Payer: Medicare Other | Admitting: Oncology

## 2012-10-20 ENCOUNTER — Ambulatory Visit: Payer: Medicare Other

## 2012-10-20 ENCOUNTER — Telehealth: Payer: Self-pay | Admitting: Oncology

## 2012-10-20 VITALS — BP 131/74 | HR 70 | Temp 97.7°F | Resp 19 | Ht 73.0 in | Wt 211.2 lb

## 2012-10-20 DIAGNOSIS — C252 Malignant neoplasm of tail of pancreas: Secondary | ICD-10-CM

## 2012-10-20 DIAGNOSIS — R634 Abnormal weight loss: Secondary | ICD-10-CM

## 2012-10-20 DIAGNOSIS — C787 Secondary malignant neoplasm of liver and intrahepatic bile duct: Secondary | ICD-10-CM

## 2012-10-20 DIAGNOSIS — R109 Unspecified abdominal pain: Secondary | ICD-10-CM

## 2012-10-20 DIAGNOSIS — C259 Malignant neoplasm of pancreas, unspecified: Secondary | ICD-10-CM

## 2012-10-20 MED ORDER — HYDROCODONE-ACETAMINOPHEN 5-325 MG PO TABS: 1.0000 | ORAL_TABLET | ORAL | Status: DC | PRN

## 2012-10-20 NOTE — Progress Notes (Signed)
Reports he does not check blood sugars on consistent basis, but when he does will run in 130's to 140's. Patient denies any know barriers to ability to obtain medication or receive treatment for health care issues. He is independent in ADL's and able to drive.

## 2012-10-20 NOTE — Progress Notes (Signed)
Checked in new patient. No financial issues. °

## 2012-10-20 NOTE — Progress Notes (Signed)
Met with patient and family.  Explained role of nurse navigator.  Educational information provided on pancreatic cancer.  [facility] resource sheet provided and referral was made to dietician.  Patient denied barriers to care including transportation, social, or financial.  Education on porta cath provided to patient as he will be scheduled for placement prior to treatment.  Contact names and phone numbers of [facility] staff provided.  Will continue to follow as needed.

## 2012-10-20 NOTE — Progress Notes (Signed)
[facility] New Patient Consult   Referring MD: Hal Stoneking   Christian Kaiser 78 y.o.  [birth year]    Reason for Referral: Pancreas cancer     HPI: He reports a one-month history of abdominal pain. He saw Dr. Pete Glatter and a CT was obtained on 10/11/2012. Compared to a CT from January of 2011 new peripherally enhancing hypoattenuating liver lesions were seen consistent with metastatic disease. An infiltrative mass was noted in the pancreas tail measuring 6.3 x 2.7 cm. The splenic vein is intimately associated with the mass with prominent gastroepiploic vessels. No ascites or evidence of omental/peritoneal disease.  He was referred for an ultrasound and a biopsy of a liver lesion on 10/18/2012. The pathology ([id number]) confirmed metastatic adenocarcinoma consistent with pancreatic adenocarcinoma.   Past Medical History  Diagnosis Date  . Diabetes mellitus     type 2  . Kidney stones   . HTN (hypertension)   . HLD (hyperlipidemia)   . CAD (coronary artery disease)     s/p 4v CABG 1990s and redo CABG 2006; cath 2007 showed patent grafts  . Carotid disease, bilateral     06/2010 carotid doppler <40% RICA stenosis and 40-60% LICA stenosis  . Gastric ulcer 2003  . Diverticulitis   . Anal fissure     s/p repair  . Shingles   . Cervical spondylosis   . Sacroiliitis    .   Bilateral "retinal " tear                                                                                      2014  Past Surgical History  Procedure Laterality Date  . Coronary artery bypass graft      1990s (SVG OM2/OM3, SVG to RCA, LIMA to LAD) and redo 2006 (reverse SVT to OM & distal LCx, reverse SVG to PDA, preservation of LIMA)  . Rotator cuff repair      Left  . Knee arthroscopy      Right   .    Bilateral eye surgery                                                                                       2014  .    Kidney stone surgery  Family History  Problem Relation Age of Onset   . Cancer-"liver " Father    .    "Cancer "                                                            brother  Current outpatient prescriptions:Artificial Tear Solution (JUST TEARS EYE DROPS  OP), Apply 1 drop to eye at bedtime., Disp: , Rfl: ;  aspirin EC 81 MG tablet, Take 81 mg by mouth daily., Disp: , Rfl: ;  insulin detemir (LEVEMIR) 100 UNIT/ML injection, Inject 34 Units into the skin at bedtime. , Disp: , Rfl: ;  isosorbide mononitrate (IMDUR) 30 MG 24 hr tablet, Take 1 tablet (30 mg total) by mouth daily., Disp: 30 tablet, Rfl: 0 losartan (COZAAR) 25 MG tablet, Take 25 mg by mouth daily., Disp: , Rfl: ;  metFORMIN (GLUCOPHAGE) 1000 MG tablet, Take 1,000 mg by mouth 2 (two) times daily with a meal., Disp: , Rfl: ;  Omega-3 Fatty Acids (FISH OIL) 1200 MG CAPS, Take 1 capsule by mouth 2 (two) times daily. , Disp: , Rfl: ;  simvastatin (ZOCOR) 80 MG tablet, Take 80 mg by mouth at bedtime., Disp: , Rfl:  HYDROcodone-acetaminophen (NORCO/VICODIN) 5-325 MG per tablet, Take 1 tablet by mouth every 4 (four) hours as needed for pain., Disp: 30 tablet, Rfl: 1;  nitroGLYCERIN (NITROSTAT) 0.4 MG SL tablet, Place 1 tablet (0.4 mg total) under the tongue every 5 (five) minutes as needed for chest pain., Disp: 60 tablet, Rfl: 0  Allergies: No Known Allergies  Social History: He is retired from the [employer]. He lives in [place]. He quit smoking cigarettes 10 years ago. History of social alcohol use. No transfusion history. No risk factor for HIV or hepatitis.   ROS:   Positives include: 11 pound weight loss, anorexia, constipation, abdominal pain  A complete ROS was otherwise negative.  Physical Exam:  Blood pressure 131/74, pulse 70, temperature 97.7 F (36.5 C), temperature source Oral, resp. rate 19, height 6\' 1"  (1.854 m), weight 211 lb 3.2 oz (95.8 kg).  HEENT: Edentulous, upper and lower denture plate, neck without mass Lungs: Clear bilaterally Cardiac: Regular rate and  rhythm Abdomen: No hepatosplenomegaly, no apparent ascites, no mass, tender in the mid and left upper abdomen GU: Testes without mass  Vascular: No leg edema Lymph nodes: No cervical, supra-clavicular, axillary, or inguinal nodes Neurologic: Alert and oriented, the motor exam appears intact in the upper and lower extremities Skin: No rash Musculoskeletal: No spine tenderness   LAB:  CBC  Lab Results  Component Value Date   WBC 5.6 10/18/2012   HGB 12.8* 10/18/2012   HCT 37.0* 10/18/2012   MCV 81.0 10/18/2012   PLT 131* 10/18/2012     Radiology: I reviewed the abdomen CT from 10/11/2012 with the patient's wife and daughter. There are numerous liver metastases and a pancreas mass   Assessment/Plan:   1. Adenocarcinoma of the pancreas-status post an ultrasound guided biopsy of a liver lesion 10/18/2012 consistent with metastatic pancreas cancer -CT 10/11/2012 revealed a pancreas tail mass and liver metastases  2. Abdominal pain secondary to the pancreas mass  3. Anorexia/weight loss  4. Diabetes  5. History of coronary artery disease   Disposition:   Christian Kaiser has been diagnosed with metastatic pancreas cancer. I discussed the diagnosis, prognosis, and treatment options with Christian Kaiser and his family. We reviewed the 10/11/2012 CT. He understands no therapy will be curative. We discussed supportive care and systemic treatment options.  I recommend a trial of systemic chemotherapy with the hope of improving his pain and obtaining a partial clinical remission. We discussed the gemcitabine/Abraxane regimen. We reviewed the toxicities associated with this regimen including the chance for nausea/vomiting, alopecia, mucositis, diarrhea, and hematologic toxicity. We discussed the fever, skin rash, and pneumonitis associated with gemcitabine.  We reviewed the potential for neuropathy with Abraxane. We also discussed the chance of developing hyperglycemia.  He would like to proceed with a  trial of chemotherapy. He will be referred for placement of a Port-A-Cath. A first treatment with gemcitabine/Abraxane has been scheduled for 10/28/2012. Chemotherapy will be delivered on a day 1, day 8, day 15 schedule as tolerated. The plan is to obtain a baseline CA 19-9 prior to beginning chemotherapy. We will schedule a restaging CT evaluation after 3 cycles.   He will attend a chemotherapy teaching class.  He is scheduled for an office visit on 11/04/2012. He was given a prescription for hydrocodone to use as needed for pain.  Christian Kaiser 10/20/2012, 7:03 PM

## 2012-10-20 NOTE — Telephone Encounter (Signed)
GV AND PRINTED APPT SCHED AND AVS FOR PT FOR mAY...md ADDED TX....SCHEDULED PT TO SEE NEFF ON 5.16.14 @ 9:45AM

## 2012-10-21 ENCOUNTER — Other Ambulatory Visit: Payer: Self-pay | Admitting: Radiology

## 2012-10-24 ENCOUNTER — Other Ambulatory Visit: Payer: Medicare Other

## 2012-10-24 ENCOUNTER — Other Ambulatory Visit (HOSPITAL_BASED_OUTPATIENT_CLINIC_OR_DEPARTMENT_OTHER): Payer: Medicare Other | Admitting: Lab

## 2012-10-24 DIAGNOSIS — C252 Malignant neoplasm of tail of pancreas: Secondary | ICD-10-CM

## 2012-10-24 DIAGNOSIS — C259 Malignant neoplasm of pancreas, unspecified: Secondary | ICD-10-CM

## 2012-10-24 DIAGNOSIS — C787 Secondary malignant neoplasm of liver and intrahepatic bile duct: Secondary | ICD-10-CM

## 2012-10-24 LAB — COMPREHENSIVE METABOLIC PANEL (CC13)
AST: 10 U/L (ref 5–34)
Alkaline Phosphatase: 62 U/L (ref 40–150)
Glucose: 322 mg/dl — ABNORMAL HIGH (ref 70–99)
Potassium: 4.2 mEq/L (ref 3.5–5.1)
Sodium: 138 mEq/L (ref 136–145)
Total Bilirubin: 0.41 mg/dL (ref 0.20–1.20)
Total Protein: 7.2 g/dL (ref 6.4–8.3)

## 2012-10-24 LAB — CANCER ANTIGEN 19-9: CA 19-9: 3.2 U/mL (ref <35.0)

## 2012-10-24 LAB — CBC WITH DIFFERENTIAL/PLATELET
BASO%: 0.3 % (ref 0.0–2.0)
EOS%: 1.2 % (ref 0.0–7.0)
MCH: 28.1 pg (ref 27.2–33.4)
MCHC: 33.3 g/dL (ref 32.0–36.0)
MONO#: 0.3 10*3/uL (ref 0.1–0.9)
NEUT%: 67.8 % (ref 39.0–75.0)
RBC: 4.37 10*6/uL (ref 4.20–5.82)
RDW: 13.1 % (ref 11.0–14.6)
WBC: 5.8 10*3/uL (ref 4.0–10.3)
lymph#: 1.5 10*3/uL (ref 0.9–3.3)
nRBC: 0 % (ref 0–0)

## 2012-10-26 ENCOUNTER — Encounter (HOSPITAL_COMMUNITY): Payer: Self-pay | Admitting: Pharmacy Technician

## 2012-10-27 ENCOUNTER — Ambulatory Visit (HOSPITAL_COMMUNITY): Admission: RE | Admit: 2012-10-27 | Payer: Medicare Other | Source: Ambulatory Visit

## 2012-10-27 ENCOUNTER — Other Ambulatory Visit: Payer: Self-pay | Admitting: Oncology

## 2012-10-27 ENCOUNTER — Ambulatory Visit (HOSPITAL_COMMUNITY)
Admission: RE | Admit: 2012-10-27 | Discharge: 2012-10-27 | Disposition: A | Discharge status: Home / Self Care | Payer: Medicare Other | Source: Ambulatory Visit | Attending: Oncology | Admitting: Oncology

## 2012-10-27 ENCOUNTER — Encounter (HOSPITAL_COMMUNITY): Payer: Self-pay

## 2012-10-27 DIAGNOSIS — C259 Malignant neoplasm of pancreas, unspecified: Secondary | ICD-10-CM

## 2012-10-27 DIAGNOSIS — I2510 Atherosclerotic heart disease of native coronary artery without angina pectoris: Secondary | ICD-10-CM | POA: Insufficient documentation

## 2012-10-27 DIAGNOSIS — I10 Essential (primary) hypertension: Secondary | ICD-10-CM | POA: Insufficient documentation

## 2012-10-27 DIAGNOSIS — Z794 Long term (current) use of insulin: Secondary | ICD-10-CM | POA: Insufficient documentation

## 2012-10-27 DIAGNOSIS — Z79899 Other long term (current) drug therapy: Secondary | ICD-10-CM | POA: Insufficient documentation

## 2012-10-27 DIAGNOSIS — Z951 Presence of aortocoronary bypass graft: Secondary | ICD-10-CM | POA: Insufficient documentation

## 2012-10-27 DIAGNOSIS — E119 Type 2 diabetes mellitus without complications: Secondary | ICD-10-CM | POA: Insufficient documentation

## 2012-10-27 DIAGNOSIS — I779 Disorder of arteries and arterioles, unspecified: Secondary | ICD-10-CM | POA: Insufficient documentation

## 2012-10-27 LAB — GLUCOSE, CAPILLARY: Glucose-Capillary: 145 mg/dL — ABNORMAL HIGH (ref 70–99)

## 2012-10-27 MED ORDER — CEFAZOLIN SODIUM-DEXTROSE 2-3 GM-% IV SOLR
2.0000 g | Freq: Once | INTRAVENOUS | Status: DC
  Filled 2012-10-27: qty 50

## 2012-10-27 MED ORDER — MIDAZOLAM HCL 2 MG/2ML IJ SOLN
INTRAMUSCULAR | Status: AC | PRN
  Administered 2012-10-27: 2 mg via INTRAVENOUS

## 2012-10-27 MED ORDER — FENTANYL CITRATE 0.05 MG/ML IJ SOLN
INTRAMUSCULAR | Status: AC
  Filled 2012-10-27: qty 4

## 2012-10-27 MED ORDER — FENTANYL CITRATE 0.05 MG/ML IJ SOLN
INTRAMUSCULAR | Status: AC | PRN
  Administered 2012-10-27 (×2): 100 ug via INTRAVENOUS

## 2012-10-27 MED ORDER — LIDOCAINE HCL 1 % IJ SOLN
INTRAMUSCULAR | Status: AC
  Filled 2012-10-27: qty 20

## 2012-10-27 MED ORDER — MIDAZOLAM HCL 2 MG/2ML IJ SOLN
INTRAMUSCULAR | Status: AC
  Filled 2012-10-27: qty 4

## 2012-10-27 MED ORDER — HEPARIN SOD (PORK) LOCK FLUSH 100 UNIT/ML IV SOLN
500.0000 [IU] | Freq: Once | INTRAVENOUS | Status: AC
  Administered 2012-10-27: 500 [IU] via INTRAVENOUS

## 2012-10-27 MED ORDER — SODIUM CHLORIDE 0.9 % IV SOLN
Freq: Once | INTRAVENOUS | Status: AC
  Administered 2012-10-27: 20 mL/h via INTRAVENOUS

## 2012-10-27 NOTE — H&P (Signed)
Christian Kaiser is an 78 y.o. male.   Chief Complaint: "I'm getting a port a cath put in" HPI: Patient with history of metastatic pancreatic carcinoma presents today for port cath placement for chemotherapy.   Past Medical History  Diagnosis Date  . Diabetes mellitus     type 2  . Kidney stones   . HTN (hypertension)   . HLD (hyperlipidemia)   . CAD (coronary artery disease)     s/p 4v CABG 1990s and redo CABG 2006; cath 2007 showed patent grafts  . Carotid disease, bilateral     06/2010 carotid doppler <40% RICA stenosis and 40-60% LICA stenosis  . Gastric ulcer 2003  . Diverticulitis   . Anal fissure     s/p repair  . Shingles   . Cervical spondylosis   . Sacroiliitis     Past Surgical History  Procedure Laterality Date  . Coronary artery bypass graft      1990s (SVG OM2/OM3, SVG to RCA, LIMA to LAD) and redo 2006 (reverse SVT to OM & distal LCx, reverse SVG to PDA, preservation of LIMA)  . Rotator cuff repair      Left  . Knee arthroscopy      Right    Family History  Problem Relation Age of Onset  . Cancer Father    Social History:  reports that he quit smoking about 10 years ago. He does not have any smokeless tobacco history on file. He reports that he does not drink alcohol or use illicit drugs.  Allergies: No Known Allergies  Current outpatient prescriptions:Artificial Tear Solution (JUST TEARS EYE DROPS OP), Apply 1 drop to eye at bedtime., Disp: , Rfl: ;  aspirin EC 81 MG tablet, Take 81 mg by mouth daily., Disp: , Rfl: ;  glimepiride (AMARYL) 4 MG tablet, Take 4 mg by mouth 2 (two) times daily., Disp: , Rfl: ;  HYDROcodone-acetaminophen (NORCO/VICODIN) 5-325 MG per tablet, Take 1 tablet by mouth every 4 (four) hours as needed for pain., Disp: 30 tablet, Rfl: 1 insulin detemir (LEVEMIR) 100 UNIT/ML injection, Inject 34 Units into the skin at bedtime. , Disp: , Rfl: ;  isosorbide mononitrate (IMDUR) 60 MG 24 hr tablet, Take 30 mg by mouth every morning., Disp: ,  Rfl: ;  losartan (COZAAR) 25 MG tablet, Take 25 mg by mouth every morning. , Disp: , Rfl: ;  metFORMIN (GLUCOPHAGE) 1000 MG tablet, Take 1,000 mg by mouth 2 (two) times daily with a meal., Disp: , Rfl:  nitroGLYCERIN (NITROSTAT) 0.4 MG SL tablet, Place 1 tablet (0.4 mg total) under the tongue every 5 (five) minutes as needed for chest pain., Disp: 60 tablet, Rfl: 0;  Omega-3 Fatty Acids (FISH OIL) 1200 MG CAPS, Take 1 capsule by mouth 2 (two) times daily. , Disp: , Rfl: ;  simvastatin (ZOCOR) 80 MG tablet, Take 80 mg by mouth at bedtime., Disp: , Rfl:  No current facility-administered medications for this encounter. Facility-Administered Medications Ordered in Other Encounters: ceFAZolin (ANCEF) IVPB 2 g/50 mL premix, 2 g, Intravenous, Once, Robet Leu, PA-C  Results for orders placed in visit on 10/24/12  CBC WITH DIFFERENTIAL      Result Value Range   WBC 5.8  4.0 - 10.3 10e3/uL   NEUT# 4.0  1.5 - 6.5 10e3/uL   HGB 12.3 (*) 13.0 - 17.1 g/dL   HCT 96.0 (*) 45.4 - 09.8 %   Platelets 125 (*) 140 - 400 10e3/uL   MCV 84.4  79.3 -  98.0 fL   MCH 28.1  27.2 - 33.4 pg   MCHC 33.3  32.0 - 36.0 g/dL   RBC 4.09  8.11 - 9.14 10e6/uL   RDW 13.1  11.0 - 14.6 %   lymph# 1.5  0.9 - 3.3 10e3/uL   MONO# 0.3  0.1 - 0.9 10e3/uL   Eosinophils Absolute 0.1  0.0 - 0.5 10e3/uL   Basophils Absolute 0.0  0.0 - 0.1 10e3/uL   NEUT% 67.8  39.0 - 75.0 %   LYMPH% 25.2  14.0 - 49.0 %   MONO% 5.5  0.0 - 14.0 %   EOS% 1.2  0.0 - 7.0 %   BASO% 0.3  0.0 - 2.0 %   nRBC 0  0 - 0 %  CANCER ANTIGEN 19-9      Result Value Range   CA 19-9 3.2  <35.0 U/mL  COMPREHENSIVE METABOLIC PANEL (CC13)      Result Value Range   Sodium 138  136 - 145 mEq/L   Potassium 4.2  3.5 - 5.1 mEq/L   Chloride 104  98 - 107 mEq/L   CO2 24  22 - 29 mEq/L   Glucose 322 (*) 70 - 99 mg/dl   BUN 78.2  7.0 - 95.6 mg/dL   Creatinine 1.3  0.7 - 1.3 mg/dL   Total Bilirubin 2.13  0.20 - 1.20 mg/dL   Alkaline Phosphatase 62  40 - 150 U/L   AST  10  5 - 34 U/L   ALT 11  0 - 55 U/L   Total Protein 7.2  6.4 - 8.3 g/dL   Albumin 3.6  3.5 - 5.0 g/dL   Calcium 9.3  8.4 - 08.6 mg/dL     Review of Systems  Constitutional: Negative for fever and chills.  Respiratory: Negative for cough and shortness of breath.   Cardiovascular: Negative for chest pain.  Gastrointestinal: Positive for abdominal pain. Negative for nausea and vomiting.  Musculoskeletal: Negative for back pain.  Neurological: Positive for headaches.  Endo/Heme/Allergies: Does not bruise/bleed easily.  Vitals: BP 129/80  HR 74  R 18  TEMP 97.7  O2 SATS 99% RA   Physical Exam  Constitutional: He is oriented to person, place, and time. He appears well-developed and well-nourished.  Cardiovascular: Normal rate.   occ ectopy noted  Respiratory: Effort normal and breath sounds normal.  GI: Soft. Bowel sounds are normal.  Mildly tender epigastric/LUQ regions  Musculoskeletal: Normal range of motion. He exhibits no edema.  Neurological: He is alert and oriented to person, place, and time.     Assessment/Plan Pt with hx of metastatic pancreatic carcinoma. Plan is for port a cath placement today for chemotherapy. Details/risks of procedure d/w pt/family with their understanding and consent.  Macarius Ruark,D KEVIN 10/27/2012, 10:50 AM

## 2012-10-27 NOTE — H&P (Signed)
Agree 

## 2012-10-27 NOTE — Procedures (Signed)
Procedure:  Right IJ porta-cath Access:  Right IJ vein Single lumen port placed.  Cath tip at cavoatrial junction.  OK to use.  No PTX.

## 2012-10-28 ENCOUNTER — Telehealth: Payer: Self-pay

## 2012-10-28 ENCOUNTER — Other Ambulatory Visit: Payer: Self-pay | Admitting: *Deleted

## 2012-10-28 ENCOUNTER — Other Ambulatory Visit: Payer: Self-pay | Admitting: Oncology

## 2012-10-28 ENCOUNTER — Ambulatory Visit (HOSPITAL_BASED_OUTPATIENT_CLINIC_OR_DEPARTMENT_OTHER): Payer: Medicare Other

## 2012-10-28 ENCOUNTER — Ambulatory Visit: Payer: Medicare Other | Admitting: Nutrition

## 2012-10-28 VITALS — BP 115/58 | HR 59 | Temp 97.4°F | Resp 18

## 2012-10-28 DIAGNOSIS — C259 Malignant neoplasm of pancreas, unspecified: Secondary | ICD-10-CM

## 2012-10-28 DIAGNOSIS — Z5111 Encounter for antineoplastic chemotherapy: Secondary | ICD-10-CM

## 2012-10-28 DIAGNOSIS — C252 Malignant neoplasm of tail of pancreas: Secondary | ICD-10-CM

## 2012-10-28 DIAGNOSIS — C257 Malignant neoplasm of other parts of pancreas: Secondary | ICD-10-CM

## 2012-10-28 DIAGNOSIS — Z95828 Presence of other vascular implants and grafts: Secondary | ICD-10-CM

## 2012-10-28 MED ORDER — DEXAMETHASONE SODIUM PHOSPHATE 10 MG/ML IJ SOLN
10.0000 mg | Freq: Once | INTRAMUSCULAR | Status: AC
  Administered 2012-10-28: 10 mg via INTRAVENOUS

## 2012-10-28 MED ORDER — SODIUM CHLORIDE 0.9 % IV SOLN
Freq: Once | INTRAVENOUS | Status: AC
  Administered 2012-10-28: 11:00:00 via INTRAVENOUS

## 2012-10-28 MED ORDER — HEPARIN SOD (PORK) LOCK FLUSH 100 UNIT/ML IV SOLN
500.0000 [IU] | Freq: Once | INTRAVENOUS | Status: AC | PRN
  Administered 2012-10-28: 500 [IU]
  Filled 2012-10-28: qty 5

## 2012-10-28 MED ORDER — PROCHLORPERAZINE MALEATE 10 MG PO TABS: 10.0000 mg | ORAL_TABLET | Freq: Four times a day (QID) | ORAL | Status: DC | PRN

## 2012-10-28 MED ORDER — PACLITAXEL PROTEIN-BOUND CHEMO INJECTION 100 MG
100.0000 mg/m2 | Freq: Once | INTRAVENOUS | Status: AC
  Administered 2012-10-28: 225 mg via INTRAVENOUS
  Filled 2012-10-28: qty 45

## 2012-10-28 MED ORDER — SODIUM CHLORIDE 0.9 % IV SOLN
800.0000 mg/m2 | Freq: Once | INTRAVENOUS | Status: AC
  Administered 2012-10-28: 1786 mg via INTRAVENOUS
  Filled 2012-10-28: qty 47.03

## 2012-10-28 MED ORDER — LIDOCAINE-PRILOCAINE 2.5-2.5 % EX CREA: TOPICAL_CREAM | CUTANEOUS | Status: DC | PRN

## 2012-10-28 MED ORDER — SODIUM CHLORIDE 0.9 % IJ SOLN
10.0000 mL | INTRAMUSCULAR | Status: DC | PRN
  Administered 2012-10-28: 10 mL
  Filled 2012-10-28: qty 10

## 2012-10-28 MED ORDER — ONDANSETRON 8 MG/50ML IVPB (CHCC)
8.0000 mg | Freq: Once | INTRAVENOUS | Status: AC
  Administered 2012-10-28: 8 mg via INTRAVENOUS

## 2012-10-28 MED ORDER — HYDROCODONE-ACETAMINOPHEN 5-325 MG PO TABS
1.0000 | ORAL_TABLET | Freq: Once | ORAL | Status: AC
  Administered 2012-10-28: 1 via ORAL

## 2012-10-28 NOTE — Patient Instructions (Addendum)
[facility] Discharge Instructions for Patients Receiving Chemotherapy  Today you received the following chemotherapy agents ABRAXANE, GEMZAR To help prevent nausea and vomiting after your treatment, we encourage you to take your nausea medication   Take it as often as prescribed.   If you develop nausea and vomiting that is not controlled by your nausea medication, call the clinic. If it is after clinic hours your family physician or the after hours number for the clinic or go to the Emergency Department.   BELOW ARE SYMPTOMS THAT SHOULD BE REPORTED IMMEDIATELY:  *FEVER GREATER THAN 100.5 F  *CHILLS WITH OR WITHOUT FEVER  NAUSEA AND VOMITING THAT IS NOT CONTROLLED WITH YOUR NAUSEA MEDICATION  *UNUSUAL SHORTNESS OF BREATH  *UNUSUAL BRUISING OR BLEEDING  TENDERNESS IN MOUTH AND THROAT WITH OR WITHOUT PRESENCE OF ULCERS  *URINARY PROBLEMS  *BOWEL PROBLEMS  UNUSUAL RASH Items with * indicate a potential emergency and should be followed up as soon as possible.  If this is your first treatment one of the nurses will contact you 24 hours after your treatment. Please let the nurse know about any problems that you may have experienced. Feel free to call the clinic you have any questions or concerns. The clinic phone number is [phone].   I have been informed and understand all the instructions given to me. I know to contact the clinic, my physician, or go to the Emergency Department if any problems should occur. I do not have any questions at this time, but understand that I may call the clinic during office hours   should I have any questions or need assistance in obtaining follow up care.    __________________________________________  _____________  __________ Signature of Patient or Authorized Representative            Date                   Time    __________________________________________ Nurse's Signature   Nanoparticle Albumin-Bound Paclitaxel  injection What is this medicine? NANOPARTICLE ALBUMIN-BOUND PACLITAXEL (Na no PAHR ti kuhl al BYOO muhn-bound PAK li TAX el) is a chemotherapy drug. It targets fast dividing cells, like cancer cells, and causes these cells to die. This medicine is used to treat advanced breast cancer and advanced lung cancer. This medicine may be used for other purposes; ask your health care provider or pharmacist if you have questions. What should I tell my health care provider before I take this medicine? They need to know if you have any of these conditions: -kidney disease -liver disease -low blood counts, like low platelets, red blood cells, or white blood cells -recent or ongoing radiation therapy -an unusual or allergic reaction to paclitaxel, albumin, other chemotherapy, other medicines, foods, dyes, or preservatives -pregnant or trying to get pregnant -breast-feeding How should I use this medicine? This drug is given as an infusion into a vein. It is administered in a hospital or clinic by a specially trained health care professional. Talk to your pediatrician regarding the use of this medicine in children. Special care may be needed. Overdosage: If you think you have taken too much of this medicine contact a poison control center or emergency room at once. NOTE: This medicine is only for you. Do not share this medicine with others. What if I miss a dose? It is important not to miss your dose. Call your doctor or health care professional if you are unable to keep an appointment. What may interact with  this medicine? This medicine may also interact with the following medications: -cyclosporine -dexamethasone -diazepam -ketoconazole -medicines to increase blood counts like filgrastim, pegfilgrastim, sargramostim -other chemotherapy drugs like cisplatin, doxorubicin, epirubicin, etoposide, teniposide, vincristine -quinidine -testosterone -vaccines -verapamil Talk to your doctor or health care  professional before taking any of these medicines: -acetaminophen -aspirin -ibuprofen -ketoprofen -naproxen This list may not describe all possible interactions. Give your health care provider a list of all the medicines, herbs, non-prescription drugs, or dietary supplements you use. Also tell them if you smoke, drink alcohol, or use illegal drugs. Some items may interact with your medicine. What should I watch for while using this medicine? Your condition will be monitored carefully while you are receiving this medicine. You will need important blood work done while you are taking this medicine. This drug may make you feel generally unwell. This is not uncommon, as chemotherapy can affect healthy cells as well as cancer cells. Report any side effects. Continue your course of treatment even though you feel ill unless your doctor tells you to stop. In some cases, you may be given additional medicines to help with side effects. Follow all directions for their use. Call your doctor or health care professional for advice if you get a fever, chills or sore throat, or other symptoms of a cold or flu. Do not treat yourself. This drug decreases your body's ability to fight infections. Try to avoid being around people who are sick. This medicine may increase your risk to bruise or bleed. Call your doctor or health care professional if you notice any unusual bleeding. Be careful brushing and flossing your teeth or using a toothpick because you may get an infection or bleed more easily. If you have any dental work done, tell your dentist you are receiving this medicine. Avoid taking products that contain aspirin, acetaminophen, ibuprofen, naproxen, or ketoprofen unless instructed by your doctor. These medicines may hide a fever. Do not become pregnant while taking this medicine. Women should inform their doctor if they wish to become pregnant or think they might be pregnant. There is a potential for serious side  effects to an unborn child. Talk to your health care professional or pharmacist for more information. Do not breast-feed an infant while taking this medicine. Men are advised not to father a child while receiving this medicine. What side effects may I notice from receiving this medicine? Side effects that you should report to your doctor or health care professional as soon as possible: -allergic reactions like skin rash, itching or hives, swelling of the face, lips, or tongue -low blood counts - This drug may decrease the number of white blood cells, red blood cells and platelets. You may be at increased risk for infections and bleeding. -signs of infection - fever or chills, cough, sore throat, pain or difficulty passing urine -signs of decreased platelets or bleeding - bruising, pinpoint red spots on the skin, black, tarry stools, nosebleeds -signs of decreased red blood cells - unusually weak or tired, fainting spells, lightheadedness -breathing problems -changes in vision -chest pain -high or low blood pressure -mouth sores -nausea and vomiting -pain, swelling, redness or irritation at the injection site -pain, tingling, numbness in the hands or feet -slow or irregular heartbeat -swelling of the ankle, feet, hands Side effects that usually do not require medical attention (report to your doctor or health care professional if they continue or are bothersome): -aches, pains -changes in the color of fingernails -diarrhea -hair loss -loss of appetite  This list may not describe all possible side effects. Call your doctor for medical advice about side effects. You may report side effects to FDA at 1-800-FDA-1088. Where should I keep my medicine? This drug is given in a hospital or clinic and will not be stored at home. NOTE: This sheet is a summary. It may not cover all possible information. If you have questions about this medicine, talk to your doctor, pharmacist, or health care  provider.  2013, [employer]. (04/06/2011 4:13:49 PM)    Gemcitabine injection What is this medicine? GEMCITABINE (jem SIT a been) is a chemotherapy drug. This medicine is used to treat many types of cancer like breast cancer, lung cancer, pancreatic cancer, and ovarian cancer. This medicine may be used for other purposes; ask your health care provider or pharmacist if you have questions. What should I tell my health care provider before I take this medicine? They need to know if you have any of these conditions: -blood disorders -infection -kidney disease -liver disease -recent or ongoing radiation therapy -an unusual or allergic reaction to gemcitabine, other chemotherapy, other medicines, foods, dyes, or preservatives -pregnant or trying to get pregnant -breast-feeding How should I use this medicine? This drug is given as an infusion into a vein. It is administered in a hospital or clinic by a specially trained health care professional. Talk to your pediatrician regarding the use of this medicine in children. Special care may be needed. Overdosage: If you think you have taken too much of this medicine contact a poison control center or emergency room at once. NOTE: This medicine is only for you. Do not share this medicine with others. What if I miss a dose? It is important not to miss your dose. Call your doctor or health care professional if you are unable to keep an appointment. What may interact with this medicine? -medicines to increase blood counts like filgrastim, pegfilgrastim, sargramostim -some other chemotherapy drugs like cisplatin -vaccines Talk to your doctor or health care professional before taking any of these medicines: -acetaminophen -aspirin -ibuprofen -ketoprofen -naproxen This list may not describe all possible interactions. Give your health care provider a list of all the medicines, herbs, non-prescription drugs, or dietary supplements you use.  Also tell them if you smoke, drink alcohol, or use illegal drugs. Some items may interact with your medicine. What should I watch for while using this medicine? Visit your doctor for checks on your progress. This drug may make you feel generally unwell. This is not uncommon, as chemotherapy can affect healthy cells as well as cancer cells. Report any side effects. Continue your course of treatment even though you feel ill unless your doctor tells you to stop. In some cases, you may be given additional medicines to help with side effects. Follow all directions for their use. Call your doctor or health care professional for advice if you get a fever, chills or sore throat, or other symptoms of a cold or flu. Do not treat yourself. This drug decreases your body's ability to fight infections. Try to avoid being around people who are sick. This medicine may increase your risk to bruise or bleed. Call your doctor or health care professional if you notice any unusual bleeding. Be careful brushing and flossing your teeth or using a toothpick because you may get an infection or bleed more easily. If you have any dental work done, tell your dentist you are receiving this medicine. Avoid taking products that contain aspirin, acetaminophen, ibuprofen, naproxen, or  ketoprofen unless instructed by your doctor. These medicines may hide a fever. Women should inform their doctor if they wish to become pregnant or think they might be pregnant. There is a potential for serious side effects to an unborn child. Talk to your health care professional or pharmacist for more information. Do not breast-feed an infant while taking this medicine. What side effects may I notice from receiving this medicine? Side effects that you should report to your doctor or health care professional as soon as possible: -allergic reactions like skin rash, itching or hives, swelling of the face, lips, or tongue -low blood counts - this medicine may  decrease the number of white blood cells, red blood cells and platelets. You may be at increased risk for infections and bleeding. -signs of infection - fever or chills, cough, sore throat, pain or difficulty passing urine -signs of decreased platelets or bleeding - bruising, pinpoint red spots on the skin, black, tarry stools, blood in the urine -signs of decreased red blood cells - unusually weak or tired, fainting spells, lightheadedness -breathing problems -chest pain -mouth sores -nausea and vomiting -pain, swelling, redness at site where injected -pain, tingling, numbness in the hands or feet -stomach pain -swelling of ankles, feet, hands -unusual bleeding Side effects that usually do not require medical attention (report to your doctor or health care professional if they continue or are bothersome): -constipation -diarrhea -hair loss -loss of appetite -stomach upset This list may not describe all possible side effects. Call your doctor for medical advice about side effects. You may report side effects to FDA at 1-800-FDA-1088. Where should I keep my medicine? This drug is given in a hospital or clinic and will not be stored at home. NOTE: This sheet is a summary. It may not cover all possible information. If you have questions about this medicine, talk to your doctor, pharmacist, or health care provider.  2012, [employer]. (10/11/2007 6:45:54 PM)

## 2012-10-28 NOTE — Progress Notes (Signed)
Pt does not have home nausea meds prescribed.  Notified desk nurse, verified pharmacy with patient, advised patient to pick up meds today.  Pt verbalized understanding. TKF

## 2012-10-28 NOTE — Progress Notes (Signed)
Patient is a 78 year old male diagnosed with pancreas cancer. He is a patient of Dr. Truett Perna.  Past medical history includes diabetes, kidney stones, hypertension, hyperlipidemia, CAD, gastric ulcer, diverticulitis, and shingles.  Medications include Glucophage, fish oil, and Zocor.  Labs include glucose 137 on May 6.  Height: 6 feet 1 inch. Weight: 211.2 pounds. Usual body weight: 222 pounds 11/28/2011. BMI: 27.7.  Patient reports he has a fair appetite but has had decreased oral intake causing a 10-11 pound weight loss. Patient states he has had some constipation. He is eating less at mealtimes.  He appears to consume a good variety of foods . He denies limiting carbohydrates for blood sugar control.  Nutrition diagnosis: Unintended weight loss related to new diagnosis of pancreas cancer and inadequate oral intake as evidenced by 5% weight loss from usual body weight.  Intervention: Patient and wife were educated on the importance of small frequent meals with adequate calories and protein to minimize weight loss. Patient was encouraged to consume a carbohydrate-controlled diet for appropriate blood sugar control. I educated patient on strategies for dealing with constipation and diarrhea. Questions were answered. Fact sheets provided on increasing calories and protein, poor appetite, constipation, and diarrhea. Teach back method used.  Monitoring, evaluation, goals: Patient will tolerate adequate calories and protein to minimize weight loss and manage nutrition impact symptoms.  Next visit: Friday, May 30, during chemotherapy.

## 2012-10-28 NOTE — Telephone Encounter (Signed)
Called in emla cream 

## 2012-10-31 ENCOUNTER — Telehealth: Payer: Self-pay | Admitting: *Deleted

## 2012-10-31 NOTE — Telephone Encounter (Signed)
Called and message left requesting a return call for chemotherapy f/u.  Awaiting return call from patient.

## 2012-10-31 NOTE — Telephone Encounter (Signed)
Message copied by Augusto Garbe on Mon Oct 31, 2012  2:55 PM ------      Message from: Charma Igo      Created: Fri Oct 28, 2012 10:49 AM       ABRAXANE GEMZAR ON 10/28/12      CALL HOME ------

## 2012-11-01 NOTE — Telephone Encounter (Signed)
Collaborative nurse notified Triage at 12:30 pm of a voicemail from this patient to return call to him at [phone].  Called patient at this time and message left requesting return call.

## 2012-11-04 ENCOUNTER — Ambulatory Visit (HOSPITAL_BASED_OUTPATIENT_CLINIC_OR_DEPARTMENT_OTHER): Payer: Medicare Other | Admitting: Lab

## 2012-11-04 ENCOUNTER — Telehealth: Payer: Self-pay | Admitting: Oncology

## 2012-11-04 ENCOUNTER — Ambulatory Visit: Payer: Medicare Other

## 2012-11-04 ENCOUNTER — Telehealth: Payer: Self-pay | Admitting: *Deleted

## 2012-11-04 ENCOUNTER — Other Ambulatory Visit: Payer: Self-pay | Admitting: Oncology

## 2012-11-04 ENCOUNTER — Ambulatory Visit (HOSPITAL_BASED_OUTPATIENT_CLINIC_OR_DEPARTMENT_OTHER): Payer: Medicare Other | Admitting: Nurse Practitioner

## 2012-11-04 VITALS — BP 132/69 | HR 72 | Temp 98.4°F | Resp 18 | Ht 73.0 in | Wt 211.9 lb

## 2012-11-04 DIAGNOSIS — C259 Malignant neoplasm of pancreas, unspecified: Secondary | ICD-10-CM

## 2012-11-04 LAB — CBC WITH DIFFERENTIAL/PLATELET
Basophils Absolute: 0 10*3/uL (ref 0.0–0.1)
EOS%: 1.1 % (ref 0.0–7.0)
Eosinophils Absolute: 0 10*3/uL (ref 0.0–0.5)
HCT: 31.8 % — ABNORMAL LOW (ref 38.4–49.9)
HGB: 10.6 g/dL — ABNORMAL LOW (ref 13.0–17.1)
MCH: 27.9 pg (ref 27.2–33.4)
MCV: 83.4 fL (ref 79.3–98.0)
MONO%: 4.4 % (ref 0.0–14.0)
NEUT#: 1.8 10*3/uL (ref 1.5–6.5)
NEUT%: 55.4 % (ref 39.0–75.0)
lymph#: 1.3 10*3/uL (ref 0.9–3.3)

## 2012-11-04 NOTE — Progress Notes (Signed)
OFFICE PROGRESS NOTE  Interval history:  Christian Kaiser returns as scheduled. He completed cycle 1 day 1 gemcitabine/Abraxane on 10/28/2012. He denies nausea/vomiting. No mouth sores. No diarrhea or constipation. No numbness or tingling in his hands or feet. He notes that his stools are intermittently "hard". He has had a few episodes of abdominal pain. He has not taken any pain medication. Blood sugars increased to a high of 381 following chemotherapy. His insulin dose has been adjusted per his primary physician. He noted a headache for a few days after treatment. He has a good appetite.   Objective: Blood pressure 132/69, pulse 72, temperature 98.4 F (36.9 C), temperature source Oral, resp. rate 18, height 6\' 1"  (1.854 m), weight 211 lb 14.4 oz (96.117 kg).  No thrush or ulcerations. Lungs clear. Regular cardiac rhythm. Port-A-Cath site is without erythema. Abdomen is soft and nontender. No hepatomegaly. Extremities without edema. Vibratory sense mildly decreased over the fingertips per tuning fork exam.  Lab Results: Lab Results  Component Value Date   WBC 5.8 10/24/2012   HGB 12.3* 10/24/2012   HCT 36.9* 10/24/2012   MCV 84.4 10/24/2012   PLT 125* 10/24/2012    Chemistry:    Chemistry      Component Value Date/Time   NA 138 10/24/2012 0925   NA 135 11/28/2011 0515   K 4.2 10/24/2012 0925   K 4.0 11/28/2011 0515   CL 104 10/24/2012 0925   CL 101 11/28/2011 0515   CO2 24 10/24/2012 0925   CO2 22 11/28/2011 0515   BUN 14.1 10/24/2012 0925   BUN 26* 11/28/2011 0515   CREATININE 1.3 10/24/2012 0925   CREATININE 1.29 11/28/2011 0515      Component Value Date/Time   CALCIUM 9.3 10/24/2012 0925   CALCIUM 9.2 11/28/2011 0515   ALKPHOS 62 10/24/2012 0925   ALKPHOS 43 11/27/2011 2229   AST 10 10/24/2012 0925   AST 14 11/27/2011 2229   ALT 11 10/24/2012 0925   ALT 13 11/27/2011 2229   BILITOT 0.41 10/24/2012 0925   BILITOT 0.2* 11/27/2011 2229       Studies/Results: Ct Abdomen Pelvis W  Contrast  10/11/2012   *RADIOLOGY REPORT*  Clinical Data: Left-sided abdominal pain for 5 days.  History of renal stones.  CT ABDOMEN AND PELVIS WITH CONTRAST  Technique:  Multidetector CT imaging of the abdomen and pelvis was performed following the standard protocol during bolus administration of intravenous contrast.  Contrast: OMNIPAQUE IOHEXOL 300 MG/ML  SOLN, 30mL OMNIPAQUE IOHEXOL 300 MG/ML  SOLN  Comparison: Stone study of 06/27/2009  Findings: Lung bases:  Left basilar scarring or atelectasis.  Mild cardiomegaly, without pericardial or pleural effusion.  A tiny hiatal hernia.  Suspect distal esophageal wall thickening.  Abdomen/pelvis:  New peripherally enhancing hypoattenuating liver lesions, consistent with metastatic disease.  Index anterior lateral segment left liver lobe lesion measures 3.2 cm on image 34/series 3 More posterior and superior lateral segment left liver lobe lesion measures 2.4 x 2.3 cm on image 19/series 3.  Normal spleen, stomach.  Infiltrative mass within the pancreatic tail and upstream body measures 6.3 x 2.7 cm on image 24/series 3.  Remainder of the pancreas enhances normally.  The splenic artery traverses this area, and appears patent.  The splenic vein is intimately associated.  There are mildly prominent gastroepiploic vessels, suggesting splenic vein insufficiency/thrombus.  Normal gallbladder, biliary tract.  Similar bilateral adrenal thickening, without well-defined mass.  Upper pole 2.7 cm right renal lesion is primarily fluid  density with a small focus of calcification.  Punctate left renal collecting system calculus.  Mild borderline aneurysmal dilatation of the infrarenal aorta, 3.1 cm.  Unchanged. No retroperitoneal or retrocrural adenopathy.  Normal colon, appendix, and terminal ileum.  Normal small bowel without abdominal ascites.    No evidence of omental or peritoneal disease.  No pelvic adenopathy.    Normal urinary bladder and prostate.  No significant free  fluid.  Bones/Musculoskeletal:  Partial fusion of the bilateral sacroiliac joints.  Favored to be degenerative.  Prominent disc bulge at the lumbosacral junction, presuming a transitional S1 vertebral body.  IMPRESSION:  1.  Findings most consistent with metastatic pancreatic adenocarcinoma.  Infiltrative mass within the pancreatic tail with extensive hepatic metastasis.  Splenic vein involvement with resultant enlargement of gastroepiploic collaterals. These results will be called to the ordering clinician or representative by the Radiologist Assistant, and communication documented in the PACS Dashboard. 2.  Nonobstructive punctate left renal calculus. 3.  Possible distal esophageal wall thickening, suggesting esophagitis. 4.  Probable complex cyst involving upper pole right kidney. Grossly similar to the 2011 exam. 5.  Similar borderline/mild infrarenal abdominal aortic aneurysm.   Original Report Authenticated By: Jeronimo Greaves, M.D.   US Biopsy  10/18/2012   *RADIOLOGY REPORT*  Clinical Data/Indication: Liver lesions  ULTRASOUND-GUIDED BIOPSY OF A RIGHT LOBE LIVER LESION.  CORE.  Sedation: Versed one mg, Fentanyl 50 mcg.  Total Moderate Sedation Time: Eight minutes.  Procedure: The procedure, risks, benefits, and alternatives were explained to the patient. Questions regarding the procedure were encouraged and answered. The patient understands and consents to the procedure.  The right flank was prepped with betadine in a sterile fashion, and a sterile drape was applied covering the operative field. A sterile gown and sterile gloves were used for the procedure.  Under sonographic guidance, a 17 gauge needle was inserted into the right lobe of the liver and to the edge of a lesion.  Four 18 gauge core biopsies were obtained. The guide needle was removed. Final imaging was performed.  Patient tolerated the procedure well without complication.  Vital sign monitoring by nursing staff during the procedure will continue  as patient is in the special procedures unit for post procedure observation.  Findings: The images document guide needle placement within the right lobe liver lesion. Post biopsy images demonstrate no hemorrhage.  IMPRESSION: Successful ultrasound-guided core biopsy of a right lobe liver lesion.   Original Report Authenticated By: Jolaine Click, M.D.   Ir Fluoro Guide Cv Line Right  10/27/2012   *RADIOLOGY REPORT*  Clinical Data: Metastatic prostate carcinoma.  The patient requires a Port-A-Cath for chemotherapy.  IMPLANTED PORT A CATH PLACEMENT WITH ULTRASOUND AND FLUOROSCOPIC GUIDANCE  Sedation:  2.0 mg IV Versed; 200 mcg IV Fentanyl.  Total Moderate Sedation Time:  40 minutes.  Additional Medications:  2 grams IV Ancef.  As antibiotic prophylaxis, Ancef was ordered pre-procedure and administered intravenously within one hour of incision.  Fluoroscopy Time:  24 seconds.  Procedure:  The procedure, risks, benefits, and alternatives were explained to the patient.  Questions regarding the procedure were encouraged and answered.  The patient understands and consents to the procedure.  The right neck and chest were prepped with chlorhexidine in a sterile fashion, and a sterile drape was applied covering the operative field.  Maximum barrier sterile technique with sterile gowns and gloves were used for the procedure.  Local anesthesia was provided with 1% lidocaine and lidocaine with epinephrine.  After creating a  small venotomy incision, a 21 gauge needle was advanced into the right internal jugular vein under direct, real- time ultrasound guidance.  Ultrasound image documentation was performed.  After securing guidewire access, an 8 Fr dilator was placed.  A J-wire was kinked to measure appropriate catheter length.  A subcutaneous port pocket was then created along the upper chest wall utilizing sharp and blunt dissection.  Portable cautery was utilized.  The pocket was irrigated with sterile saline.  A single lumen  power injectable port was chosen for placement.  The 8 Fr catheter was tunneled from the port pocket site to the venotomy incision.  The port was placed in the pocket and secured with two Ethilon tacking sutures.  External catheter was trimmed to appropriate length based on guidewire measurement.  At the venotomy, an 8 Fr peel-away sheath was placed over a guidewire.  The catheter was then placed through the sheath and the sheath removed.  Final catheter positioning was confirmed and documented with a fluoroscopic spot image.  The port was accessed with a needle and aspirated and flushed with heparinized saline. The needle was removed.  The venotomy and port pocket incisions were closed with subcutaneous 3-0 Monocryl and subcuticular 4-0 Vicryl.  Dermabond was applied to both incisions.  Complications: None.  No pneumothorax.  Findings:  After catheter placement, the tip lies at the cavoatrial junction.  The catheter aspirates normally and is ready for immediate use.  IMPRESSION:  Placement of single lumen port a cath via right internal jugular vein.  The catheter tip lies at the cavoatrial junction.  A power injectable port a cath was placed and is ready for immediate use.   Original Report Authenticated By: Irish Lack, M.D.   Ir US Guide Vasc Access Right  10/27/2012   *RADIOLOGY REPORT*  Clinical Data: Metastatic prostate carcinoma.  The patient requires a Port-A-Cath for chemotherapy.  IMPLANTED PORT A CATH PLACEMENT WITH ULTRASOUND AND FLUOROSCOPIC GUIDANCE  Sedation:  2.0 mg IV Versed; 200 mcg IV Fentanyl.  Total Moderate Sedation Time:  40 minutes.  Additional Medications:  2 grams IV Ancef.  As antibiotic prophylaxis, Ancef was ordered pre-procedure and administered intravenously within one hour of incision.  Fluoroscopy Time:  24 seconds.  Procedure:  The procedure, risks, benefits, and alternatives were explained to the patient.  Questions regarding the procedure were encouraged and answered.  The  patient understands and consents to the procedure.  The right neck and chest were prepped with chlorhexidine in a sterile fashion, and a sterile drape was applied covering the operative field.  Maximum barrier sterile technique with sterile gowns and gloves were used for the procedure.  Local anesthesia was provided with 1% lidocaine and lidocaine with epinephrine.  After creating a small venotomy incision, a 21 gauge needle was advanced into the right internal jugular vein under direct, real- time ultrasound guidance.  Ultrasound image documentation was performed.  After securing guidewire access, an 8 Fr dilator was placed.  A J-wire was kinked to measure appropriate catheter length.  A subcutaneous port pocket was then created along the upper chest wall utilizing sharp and blunt dissection.  Portable cautery was utilized.  The pocket was irrigated with sterile saline.  A single lumen power injectable port was chosen for placement.  The 8 Fr catheter was tunneled from the port pocket site to the venotomy incision.  The port was placed in the pocket and secured with two Ethilon tacking sutures.  External catheter was trimmed to appropriate length  based on guidewire measurement.  At the venotomy, an 8 Fr peel-away sheath was placed over a guidewire.  The catheter was then placed through the sheath and the sheath removed.  Final catheter positioning was confirmed and documented with a fluoroscopic spot image.  The port was accessed with a needle and aspirated and flushed with heparinized saline. The needle was removed.  The venotomy and port pocket incisions were closed with subcutaneous 3-0 Monocryl and subcuticular 4-0 Vicryl.  Dermabond was applied to both incisions.  Complications: None.  No pneumothorax.  Findings:  After catheter placement, the tip lies at the cavoatrial junction.  The catheter aspirates normally and is ready for immediate use.  IMPRESSION:  Placement of single lumen port a cath via right  internal jugular vein.  The catheter tip lies at the cavoatrial junction.  A power injectable port a cath was placed and is ready for immediate use.   Original Report Authenticated By: Irish Lack, M.D.    Medications: I have reviewed the patient's current medications.  Assessment/Plan:  1. Adenocarcinoma of the pancreas status post ultrasound-guided biopsy of a liver lesion 10/18/2012 consistent with metastatic pancreas cancer. CT 10/11/2012 revealed a pancreas tail mass and liver metastases. CA 19-9 in normal range on 10/24/2012. Initiation of gemcitabine/Abraxane on a day 1, day 8 day 15 schedule on 10/28/2012. 2. Abdominal pain secondary to the pancreas mass. 3. Anorexia/weight loss. 4. Diabetes. Exacerbated by steroid premedication. The Decadron will be reduced to 5 mg with subsequent treatments. 5. History of coronary artery disease. 6. Headache following cycle 1 day 1 gemcitabine/Abraxane. Question related to Zofran.  Disposition-Christian Kaiser appears stable. Plan to proceed with cycle 1 day 8 gemcitabine/Abraxane today as scheduled pending CBC result. He will return for the day 15 treatment on 11/11/2012. We will see him in followup prior to beginning cycle 2 on 11/25/2012. He will contact the office in the interim with any problems.  Plan reviewed with Dr. Truett Perna.  Lonna Cobb ANP/GNP-BC

## 2012-11-04 NOTE — Telephone Encounter (Signed)
Per staff message and POF I have scheduled appts.  JMW  

## 2012-11-04 NOTE — Progress Notes (Signed)
1220-Platelet count-85 from today's CBC results.  Hold chemo today and have patient return as scheduled next Friday per Dr. Truett Perna.

## 2012-11-04 NOTE — Telephone Encounter (Signed)
Gave pt appt for lab <ML and MD on June and July 2014

## 2012-11-11 ENCOUNTER — Ambulatory Visit: Payer: Medicare Other | Admitting: Nutrition

## 2012-11-11 ENCOUNTER — Other Ambulatory Visit: Payer: Self-pay | Admitting: Oncology

## 2012-11-11 ENCOUNTER — Ambulatory Visit (HOSPITAL_BASED_OUTPATIENT_CLINIC_OR_DEPARTMENT_OTHER): Payer: Medicare Other

## 2012-11-11 ENCOUNTER — Other Ambulatory Visit (HOSPITAL_BASED_OUTPATIENT_CLINIC_OR_DEPARTMENT_OTHER): Payer: Medicare Other | Admitting: Lab

## 2012-11-11 VITALS — BP 119/76 | HR 69 | Temp 97.7°F

## 2012-11-11 DIAGNOSIS — C259 Malignant neoplasm of pancreas, unspecified: Secondary | ICD-10-CM

## 2012-11-11 DIAGNOSIS — Z5111 Encounter for antineoplastic chemotherapy: Secondary | ICD-10-CM

## 2012-11-11 DIAGNOSIS — C787 Secondary malignant neoplasm of liver and intrahepatic bile duct: Secondary | ICD-10-CM

## 2012-11-11 DIAGNOSIS — C252 Malignant neoplasm of tail of pancreas: Secondary | ICD-10-CM

## 2012-11-11 LAB — CBC WITH DIFFERENTIAL/PLATELET
Basophils Absolute: 0 10*3/uL (ref 0.0–0.1)
EOS%: 1.9 % (ref 0.0–7.0)
Eosinophils Absolute: 0.1 10*3/uL (ref 0.0–0.5)
HGB: 11.6 g/dL — ABNORMAL LOW (ref 13.0–17.1)
LYMPH%: 29.1 % (ref 14.0–49.0)
MCH: 28 pg (ref 27.2–33.4)
MCV: 84.5 fL (ref 79.3–98.0)
MONO%: 6.9 % (ref 0.0–14.0)
Platelets: 123 10*3/uL — ABNORMAL LOW (ref 140–400)
RDW: 13.7 % (ref 11.0–14.6)

## 2012-11-11 MED ORDER — HEPARIN SOD (PORK) LOCK FLUSH 100 UNIT/ML IV SOLN
500.0000 [IU] | Freq: Once | INTRAVENOUS | Status: AC | PRN
  Administered 2012-11-11: 500 [IU]
  Filled 2012-11-11: qty 5

## 2012-11-11 MED ORDER — DEXAMETHASONE SODIUM PHOSPHATE 10 MG/ML IJ SOLN
5.0000 mg | Freq: Once | INTRAMUSCULAR | Status: AC
  Administered 2012-11-11: 5 mg via INTRAVENOUS

## 2012-11-11 MED ORDER — SODIUM CHLORIDE 0.9 % IJ SOLN
10.0000 mL | INTRAMUSCULAR | Status: DC | PRN
  Administered 2012-11-11: 10 mL
  Filled 2012-11-11: qty 10

## 2012-11-11 MED ORDER — ONDANSETRON 8 MG/50ML IVPB (CHCC)
8.0000 mg | Freq: Once | INTRAVENOUS | Status: AC
  Administered 2012-11-11: 8 mg via INTRAVENOUS

## 2012-11-11 MED ORDER — SODIUM CHLORIDE 0.9 % IV SOLN
700.0000 mg/m2 | Freq: Once | INTRAVENOUS | Status: AC
  Administered 2012-11-11: 1558 mg via INTRAVENOUS
  Filled 2012-11-11: qty 41

## 2012-11-11 MED ORDER — SODIUM CHLORIDE 0.9 % IV SOLN
Freq: Once | INTRAVENOUS | Status: AC
  Administered 2012-11-11: 11:00:00 via INTRAVENOUS

## 2012-11-11 MED ORDER — PACLITAXEL PROTEIN-BOUND CHEMO INJECTION 100 MG
100.0000 mg/m2 | Freq: Once | INTRAVENOUS | Status: AC
  Administered 2012-11-11: 225 mg via INTRAVENOUS
  Filled 2012-11-11: qty 45

## 2012-11-11 NOTE — Patient Instructions (Addendum)
[facility] Discharge Instructions for Patients Receiving Chemotherapy  Today you received the following chemotherapy agents: abraxane, gemzar   To help prevent nausea and vomiting after your treatment, we encourage you to take your nausea medication.  Take it as often as prescribed.     If you develop nausea and vomiting that is not controlled by your nausea medication, call the clinic. If it is after clinic hours your family physician or the after hours number for the clinic or go to the Emergency Department.   BELOW ARE SYMPTOMS THAT SHOULD BE REPORTED IMMEDIATELY:  *FEVER GREATER THAN 100.5 F  *CHILLS WITH OR WITHOUT FEVER  NAUSEA AND VOMITING THAT IS NOT CONTROLLED WITH YOUR NAUSEA MEDICATION  *UNUSUAL SHORTNESS OF BREATH  *UNUSUAL BRUISING OR BLEEDING  TENDERNESS IN MOUTH AND THROAT WITH OR WITHOUT PRESENCE OF ULCERS  *URINARY PROBLEMS  *BOWEL PROBLEMS  UNUSUAL RASH Items with * indicate a potential emergency and should be followed up as soon as possible.  Feel free to call the clinic you have any questions or concerns. The clinic phone number is [phone].   I have been informed and understand all the instructions given to me. I know to contact the clinic, my physician, or go to the Emergency Department if any problems should occur. I do not have any questions at this time, but understand that I may call the clinic during office hours   should I have any questions or need assistance in obtaining follow up care.    __________________________________________  _____________  __________ Signature of Patient or Authorized Representative            Date                   Time    __________________________________________ Nurse's Signature    

## 2012-11-11 NOTE — Progress Notes (Signed)
Patient presents early to nutrition followup. His wife and daughter are in attendance. Patient's weight is stable and was documented as 211.9 pounds May 23 from 211.2 pounds may 8. Patient denies nausea and vomiting and reports constipation has improved. He does have occasional pain in his abdomen however this is very brief and does not interfere with his oral intake. Dietary recall reveals patient is consuming 3 meals a day. He does not enjoy snacking between meals. He is eating a variety of foods.  Nutrition diagnosis: Unintended weight loss has improved.  Intervention: Patient was educated to add in a very small snack midmorning and an oral nutrition supplement midafternoon. He is to continue 3 meals daily concentrating on calories and protein. Patient did agree to try to increase oral intake. Teach back method used.  Monitoring, evaluation, goals: Patient will tolerate adequate calories and protein to minimize weight loss and manage nutrition impact symptoms.  Next visit: Friday, June 20.

## 2012-11-17 ENCOUNTER — Other Ambulatory Visit (HOSPITAL_BASED_OUTPATIENT_CLINIC_OR_DEPARTMENT_OTHER): Payer: Medicare Other | Admitting: Lab

## 2012-11-17 ENCOUNTER — Telehealth: Payer: Self-pay | Admitting: *Deleted

## 2012-11-17 DIAGNOSIS — C259 Malignant neoplasm of pancreas, unspecified: Secondary | ICD-10-CM

## 2012-11-17 DIAGNOSIS — C787 Secondary malignant neoplasm of liver and intrahepatic bile duct: Secondary | ICD-10-CM

## 2012-11-17 DIAGNOSIS — C252 Malignant neoplasm of tail of pancreas: Secondary | ICD-10-CM

## 2012-11-17 LAB — CBC WITH DIFFERENTIAL/PLATELET
BASO%: 0.5 % (ref 0.0–2.0)
Basophils Absolute: 0 10*3/uL (ref 0.0–0.1)
HCT: 33.1 % — ABNORMAL LOW (ref 38.4–49.9)
LYMPH%: 56.4 % — ABNORMAL HIGH (ref 14.0–49.0)
MCHC: 33.5 g/dL (ref 32.0–36.0)
MONO#: 0.1 10*3/uL (ref 0.1–0.9)
NEUT%: 40 % (ref 39.0–75.0)
Platelets: 168 10*3/uL (ref 140–400)
WBC: 2 10*3/uL — ABNORMAL LOW (ref 4.0–10.3)

## 2012-11-17 NOTE — Telephone Encounter (Signed)
Nosebleeds since 5/27-last chemo 5/30. Now will have spontaneous nosebleed without blowing. Just bending over to tie shoe can start nosebleed. Are able to get it to stop after few minutes with rest and cold compress and pressure. Also having headache that does not resolve with Tylenol or his Hydrocodone. No sores or pain in his nose. Has no history of nosebleeds in past. Per Dr. Truett Perna, come to office for CBC and wait in lobby for results. Daughter notified & will bring him in at 1030 for labs and wait for results.

## 2012-11-18 NOTE — Telephone Encounter (Signed)
Late entry for 11/17/12 at 11:00: Patient in office for labs and platelet count normal & hgb is adequate. BP 106/73 with irregular pulse of 64. Afebrile at 98.5. No dyspnea or s/s of infection. No nose bleeding at present. Still some mild sensation of dizziness. Reports he is eating and drinking well and does not think he is dehydrated. Dr. Truett Perna was notified and suggested he contact his PCP or cardiologist to see if he may need to adjust his BP med, since BP is lower than his baseline. He agreed to contact his cardiologist about BP meds.

## 2012-11-20 ENCOUNTER — Other Ambulatory Visit: Payer: Self-pay | Admitting: Oncology

## 2012-11-25 ENCOUNTER — Ambulatory Visit (HOSPITAL_BASED_OUTPATIENT_CLINIC_OR_DEPARTMENT_OTHER): Payer: Medicare Other

## 2012-11-25 ENCOUNTER — Telehealth: Payer: Self-pay | Admitting: Oncology

## 2012-11-25 ENCOUNTER — Other Ambulatory Visit: Payer: Self-pay | Admitting: Oncology

## 2012-11-25 ENCOUNTER — Other Ambulatory Visit (HOSPITAL_BASED_OUTPATIENT_CLINIC_OR_DEPARTMENT_OTHER): Payer: Medicare Other | Admitting: Lab

## 2012-11-25 ENCOUNTER — Ambulatory Visit (HOSPITAL_BASED_OUTPATIENT_CLINIC_OR_DEPARTMENT_OTHER): Payer: Medicare Other | Admitting: Nurse Practitioner

## 2012-11-25 VITALS — BP 108/57 | HR 70 | Temp 96.8°F | Resp 18 | Ht 72.0 in | Wt 210.9 lb

## 2012-11-25 DIAGNOSIS — C787 Secondary malignant neoplasm of liver and intrahepatic bile duct: Secondary | ICD-10-CM

## 2012-11-25 DIAGNOSIS — C259 Malignant neoplasm of pancreas, unspecified: Secondary | ICD-10-CM

## 2012-11-25 DIAGNOSIS — C252 Malignant neoplasm of tail of pancreas: Secondary | ICD-10-CM

## 2012-11-25 DIAGNOSIS — G893 Neoplasm related pain (acute) (chronic): Secondary | ICD-10-CM

## 2012-11-25 DIAGNOSIS — D696 Thrombocytopenia, unspecified: Secondary | ICD-10-CM

## 2012-11-25 DIAGNOSIS — Z5111 Encounter for antineoplastic chemotherapy: Secondary | ICD-10-CM

## 2012-11-25 LAB — CBC WITH DIFFERENTIAL/PLATELET
BASO%: 0.6 % (ref 0.0–2.0)
EOS%: 1.4 % (ref 0.0–7.0)
HCT: 33 % — ABNORMAL LOW (ref 38.4–49.9)
LYMPH%: 27.3 % (ref 14.0–49.0)
MCH: 28 pg (ref 27.2–33.4)
MCHC: 33.6 g/dL (ref 32.0–36.0)
MCV: 83.3 fL (ref 79.3–98.0)
MONO%: 7.2 % (ref 0.0–14.0)
NEUT%: 63.5 % (ref 39.0–75.0)
Platelets: 100 10*3/uL — ABNORMAL LOW (ref 140–400)

## 2012-11-25 LAB — COMPREHENSIVE METABOLIC PANEL (CC13)
AST: 11 U/L (ref 5–34)
Alkaline Phosphatase: 54 U/L (ref 40–150)
BUN: 13.7 mg/dL (ref 7.0–26.0)
Creatinine: 1.2 mg/dL (ref 0.7–1.3)
Glucose: 253 mg/dl — ABNORMAL HIGH (ref 70–99)

## 2012-11-25 MED ORDER — PACLITAXEL PROTEIN-BOUND CHEMO INJECTION 100 MG
80.0000 mg/m2 | Freq: Once | INTRAVENOUS | Status: AC
  Administered 2012-11-25: 175 mg via INTRAVENOUS
  Filled 2012-11-25: qty 35

## 2012-11-25 MED ORDER — HEPARIN SOD (PORK) LOCK FLUSH 100 UNIT/ML IV SOLN
500.0000 [IU] | Freq: Once | INTRAVENOUS | Status: AC | PRN
  Administered 2012-11-25: 500 [IU]
  Filled 2012-11-25: qty 5

## 2012-11-25 MED ORDER — SODIUM CHLORIDE 0.9 % IV SOLN
560.0000 mg/m2 | Freq: Once | INTRAVENOUS | Status: AC
  Administered 2012-11-25: 1254 mg via INTRAVENOUS
  Filled 2012-11-25: qty 33.02

## 2012-11-25 MED ORDER — DEXAMETHASONE SODIUM PHOSPHATE 10 MG/ML IJ SOLN
5.0000 mg | Freq: Once | INTRAMUSCULAR | Status: AC
  Administered 2012-11-25: 5 mg via INTRAVENOUS

## 2012-11-25 MED ORDER — SODIUM CHLORIDE 0.9 % IJ SOLN
10.0000 mL | INTRAMUSCULAR | Status: DC | PRN
  Administered 2012-11-25: 10 mL
  Filled 2012-11-25: qty 10

## 2012-11-25 MED ORDER — SODIUM CHLORIDE 0.9 % IV SOLN
Freq: Once | INTRAVENOUS | Status: AC
  Administered 2012-11-25: 12:00:00 via INTRAVENOUS

## 2012-11-25 MED ORDER — ONDANSETRON 8 MG/50ML IVPB (CHCC)
8.0000 mg | Freq: Once | INTRAVENOUS | Status: AC
  Administered 2012-11-25: 8 mg via INTRAVENOUS

## 2012-11-25 NOTE — Patient Instructions (Signed)
[facility] Discharge Instructions for Patients Receiving Chemotherapy  Today you received the following chemotherapy agents abraxane, carboplatin  To help prevent nausea and vomiting after your treatment, we encourage you to take your nausea medication as needed   If you develop nausea and vomiting that is not controlled by your nausea medication, call the clinic.   BELOW ARE SYMPTOMS THAT SHOULD BE REPORTED IMMEDIATELY:  *FEVER GREATER THAN 100.5 F  *CHILLS WITH OR WITHOUT FEVER  NAUSEA AND VOMITING THAT IS NOT CONTROLLED WITH YOUR NAUSEA MEDICATION  *UNUSUAL SHORTNESS OF BREATH  *UNUSUAL BRUISING OR BLEEDING  TENDERNESS IN MOUTH AND THROAT WITH OR WITHOUT PRESENCE OF ULCERS  *URINARY PROBLEMS  *BOWEL PROBLEMS  UNUSUAL RASH Items with * indicate a potential emergency and should be followed up as soon as possible.  Feel free to call the clinic you have any questions or concerns. The clinic phone number is [phone].

## 2012-11-25 NOTE — Telephone Encounter (Signed)
gv and printed appt sched and avs for pt  °

## 2012-11-25 NOTE — Progress Notes (Addendum)
OFFICE PROGRESS NOTE  Interval history:  Christian Kaiser is a 78 year old man with metastatic pancreatic cancer. He is being treated with gemcitabine/Abraxane on a day 1, day 8, day 15 schedule. He completed cycle 1 day 1 on 10/24/2012. The day 8 treatment was held on 11/04/2012 for a platelet count 85,000. He completed the day 15 treatment on 11/11/2012.  He reports that overall he is feeling well. He did not feel well last week. He had an episode of "swimmy headedness". He continues to have a good appetite. He denies any nausea or vomiting associated with the chemotherapy. No mouth sores. No diarrhea. He has occasional abdominal pain. He takes pain medication as needed. He noted blood when he blew his nose earlier this week. No other bleeding.   Objective: Blood pressure 108/57, pulse 70, temperature 96.8 F (36 C), temperature source Oral, resp. rate 18, height 6' (1.829 m), weight 210 lb 14.4 oz (95.664 kg).  Oropharynx is without thrush or ulceration. Lungs are clear. Regular cardiac rhythm with occasional premature beat. Port-A-Cath site is without erythema. Abdomen is soft and nontender. No hepatomegaly. Extremities without edema. Vibratory sense mildly decreased over the fingertips per tuning fork exam.  Lab Results: Lab Results  Component Value Date   WBC 4.9 11/25/2012   HGB 11.1* 11/25/2012   HCT 33.0* 11/25/2012   MCV 83.3 11/25/2012   PLT 100* 11/25/2012    Chemistry:    Chemistry      Component Value Date/Time   NA 138 10/24/2012 0925   NA 135 11/28/2011 0515   K 4.2 10/24/2012 0925   K 4.0 11/28/2011 0515   CL 104 10/24/2012 0925   CL 101 11/28/2011 0515   CO2 24 10/24/2012 0925   CO2 22 11/28/2011 0515   BUN 14.1 10/24/2012 0925   BUN 26* 11/28/2011 0515   CREATININE 1.3 10/24/2012 0925   CREATININE 1.29 11/28/2011 0515      Component Value Date/Time   CALCIUM 9.3 10/24/2012 0925   CALCIUM 9.2 11/28/2011 0515   ALKPHOS 62 10/24/2012 0925   ALKPHOS 43 11/27/2011 2229   AST 10  10/24/2012 0925   AST 14 11/27/2011 2229   ALT 11 10/24/2012 0925   ALT 13 11/27/2011 2229   BILITOT 0.41 10/24/2012 0925   BILITOT 0.2* 11/27/2011 2229       Studies/Results: Ir Fluoro Guide Cv Line Right  10/27/2012   *RADIOLOGY REPORT*  Clinical Data: Metastatic prostate carcinoma.  The patient requires a Port-A-Cath for chemotherapy.  IMPLANTED PORT A CATH PLACEMENT WITH ULTRASOUND AND FLUOROSCOPIC GUIDANCE  Sedation:  2.0 mg IV Versed; 200 mcg IV Fentanyl.  Total Moderate Sedation Time:  40 minutes.  Additional Medications:  2 grams IV Ancef.  As antibiotic prophylaxis, Ancef was ordered pre-procedure and administered intravenously within one hour of incision.  Fluoroscopy Time:  24 seconds.  Procedure:  The procedure, risks, benefits, and alternatives were explained to the patient.  Questions regarding the procedure were encouraged and answered.  The patient understands and consents to the procedure.  The right neck and chest were prepped with chlorhexidine in a sterile fashion, and a sterile drape was applied covering the operative field.  Maximum barrier sterile technique with sterile gowns and gloves were used for the procedure.  Local anesthesia was provided with 1% lidocaine and lidocaine with epinephrine.  After creating a small venotomy incision, a 21 gauge needle was advanced into the right internal jugular vein under direct, real- time ultrasound guidance.  Ultrasound image documentation  was performed.  After securing guidewire access, an 8 Fr dilator was placed.  A J-wire was kinked to measure appropriate catheter length.  A subcutaneous port pocket was then created along the upper chest wall utilizing sharp and blunt dissection.  Portable cautery was utilized.  The pocket was irrigated with sterile saline.  A single lumen power injectable port was chosen for placement.  The 8 Fr catheter was tunneled from the port pocket site to the venotomy incision.  The port was placed in the pocket and  secured with two Ethilon tacking sutures.  External catheter was trimmed to appropriate length based on guidewire measurement.  At the venotomy, an 8 Fr peel-away sheath was placed over a guidewire.  The catheter was then placed through the sheath and the sheath removed.  Final catheter positioning was confirmed and documented with a fluoroscopic spot image.  The port was accessed with a needle and aspirated and flushed with heparinized saline. The needle was removed.  The venotomy and port pocket incisions were closed with subcutaneous 3-0 Monocryl and subcuticular 4-0 Vicryl.  Dermabond was applied to both incisions.  Complications: None.  No pneumothorax.  Findings:  After catheter placement, the tip lies at the cavoatrial junction.  The catheter aspirates normally and is ready for immediate use.  IMPRESSION:  Placement of single lumen port a cath via right internal jugular vein.  The catheter tip lies at the cavoatrial junction.  A power injectable port a cath was placed and is ready for immediate use.   Original Report Authenticated By: Irish Lack, M.D.   Ir US Guide Vasc Access Right  10/27/2012   *RADIOLOGY REPORT*  Clinical Data: Metastatic prostate carcinoma.  The patient requires a Port-A-Cath for chemotherapy.  IMPLANTED PORT A CATH PLACEMENT WITH ULTRASOUND AND FLUOROSCOPIC GUIDANCE  Sedation:  2.0 mg IV Versed; 200 mcg IV Fentanyl.  Total Moderate Sedation Time:  40 minutes.  Additional Medications:  2 grams IV Ancef.  As antibiotic prophylaxis, Ancef was ordered pre-procedure and administered intravenously within one hour of incision.  Fluoroscopy Time:  24 seconds.  Procedure:  The procedure, risks, benefits, and alternatives were explained to the patient.  Questions regarding the procedure were encouraged and answered.  The patient understands and consents to the procedure.  The right neck and chest were prepped with chlorhexidine in a sterile fashion, and a sterile drape was applied covering  the operative field.  Maximum barrier sterile technique with sterile gowns and gloves were used for the procedure.  Local anesthesia was provided with 1% lidocaine and lidocaine with epinephrine.  After creating a small venotomy incision, a 21 gauge needle was advanced into the right internal jugular vein under direct, real- time ultrasound guidance.  Ultrasound image documentation was performed.  After securing guidewire access, an 8 Fr dilator was placed.  A J-wire was kinked to measure appropriate catheter length.  A subcutaneous port pocket was then created along the upper chest wall utilizing sharp and blunt dissection.  Portable cautery was utilized.  The pocket was irrigated with sterile saline.  A single lumen power injectable port was chosen for placement.  The 8 Fr catheter was tunneled from the port pocket site to the venotomy incision.  The port was placed in the pocket and secured with two Ethilon tacking sutures.  External catheter was trimmed to appropriate length based on guidewire measurement.  At the venotomy, an 8 Fr peel-away sheath was placed over a guidewire.  The catheter was then placed through  the sheath and the sheath removed.  Final catheter positioning was confirmed and documented with a fluoroscopic spot image.  The port was accessed with a needle and aspirated and flushed with heparinized saline. The needle was removed.  The venotomy and port pocket incisions were closed with subcutaneous 3-0 Monocryl and subcuticular 4-0 Vicryl.  Dermabond was applied to both incisions.  Complications: None.  No pneumothorax.  Findings:  After catheter placement, the tip lies at the cavoatrial junction.  The catheter aspirates normally and is ready for immediate use.  IMPRESSION:  Placement of single lumen port a cath via right internal jugular vein.  The catheter tip lies at the cavoatrial junction.  A power injectable port a cath was placed and is ready for immediate use.   Original Report  Authenticated By: Irish Lack, M.D.    Medications: I have reviewed the patient's current medications.  Assessment/Plan:  1. Adenocarcinoma of the pancreas status post ultrasound-guided biopsy of a liver lesion 10/18/2012 consistent with metastatic pancreas cancer. CT 10/11/2012 revealed a pancreas tail mass and liver metastases. CA 19-9 in normal range on 10/24/2012. Initiation of gemcitabine/Abraxane on a day 1, day 8 day 15 schedule on 10/28/2012. The day 8 treatment was held on 11/04/2012 for a platelet count of 85,000. 2. Abdominal pain secondary to the pancreas mass. 3. Anorexia/weight loss. Improved. His weight is stable. 4. Diabetes. Exacerbated by steroid premedication. Decadron was reduced to 5 mg after cycle 1 day 1. 5. History of coronary artery disease. 6. Headache following cycle 1 day 1 gemcitabine/Abraxane. Question related to Zofran. He did not complain of a headache at today's visit. 7. Thrombocytopenia secondary to chemotherapy.  Disposition-Mr. Tappen appears stable. He is thrombocytopenic on labs today with a platelet count of 100,000. I discussed the thrombocytopenia with Dr. Cyndie Chime. We will make a 20% dose reduction to the gemcitabine and Abraxane beginning today. He knows to contact the office with any bleeding. He is scheduled for cycle 2 day 8 on 12/02/2012 and has a followup visit with Dr. Truett Perna prior to cycle 2 day 15 on 12/09/2012.  Dr. Cyndie Chime discussed the rationale for the dose reduction with Mr. Chandonnet and his family.  Lonna Cobb ANP/GNP-BC   Hematology oncology attending: I had direct face-to-face encounter with this patient today. He has recently diagnosed pancreatic cancer and just had his first cycle of Gemzar plus or Abraxane. He already had had a treatment delay for a low platelet count. Platelet count today still does not meet safe parameters. I reviewed his care plan. I am going to make a 20 years and dose reduction in both drugs. I  discussed these changes with the patient.  Levert Feinstein, MD

## 2012-12-02 ENCOUNTER — Other Ambulatory Visit (HOSPITAL_BASED_OUTPATIENT_CLINIC_OR_DEPARTMENT_OTHER): Payer: Medicare Other | Admitting: Lab

## 2012-12-02 ENCOUNTER — Ambulatory Visit (HOSPITAL_BASED_OUTPATIENT_CLINIC_OR_DEPARTMENT_OTHER): Payer: Medicare Other

## 2012-12-02 ENCOUNTER — Ambulatory Visit: Payer: Medicare Other | Admitting: Nutrition

## 2012-12-02 VITALS — BP 128/69 | HR 73 | Temp 97.5°F

## 2012-12-02 DIAGNOSIS — C259 Malignant neoplasm of pancreas, unspecified: Secondary | ICD-10-CM

## 2012-12-02 DIAGNOSIS — Z5111 Encounter for antineoplastic chemotherapy: Secondary | ICD-10-CM

## 2012-12-02 DIAGNOSIS — C787 Secondary malignant neoplasm of liver and intrahepatic bile duct: Secondary | ICD-10-CM

## 2012-12-02 DIAGNOSIS — C252 Malignant neoplasm of tail of pancreas: Secondary | ICD-10-CM

## 2012-12-02 LAB — CBC WITH DIFFERENTIAL/PLATELET
BASO%: 0.3 % (ref 0.0–2.0)
LYMPH%: 33.2 % (ref 14.0–49.0)
MCHC: 33.6 g/dL (ref 32.0–36.0)
MONO#: 0.2 10*3/uL (ref 0.1–0.9)
RBC: 3.87 10*6/uL — ABNORMAL LOW (ref 4.20–5.82)
RDW: 14.1 % (ref 11.0–14.6)
WBC: 3.2 10*3/uL — ABNORMAL LOW (ref 4.0–10.3)
lymph#: 1.1 10*3/uL (ref 0.9–3.3)

## 2012-12-02 MED ORDER — SODIUM CHLORIDE 0.9 % IJ SOLN
10.0000 mL | INTRAMUSCULAR | Status: DC | PRN
  Administered 2012-12-02: 10 mL
  Filled 2012-12-02: qty 10

## 2012-12-02 MED ORDER — SODIUM CHLORIDE 0.9 % IV SOLN
Freq: Once | INTRAVENOUS | Status: AC
  Administered 2012-12-02: 11:00:00 via INTRAVENOUS

## 2012-12-02 MED ORDER — HEPARIN SOD (PORK) LOCK FLUSH 100 UNIT/ML IV SOLN
500.0000 [IU] | Freq: Once | INTRAVENOUS | Status: AC | PRN
  Administered 2012-12-02: 500 [IU]
  Filled 2012-12-02: qty 5

## 2012-12-02 MED ORDER — ONDANSETRON 8 MG/50ML IVPB (CHCC)
8.0000 mg | Freq: Once | INTRAVENOUS | Status: AC
  Administered 2012-12-02: 8 mg via INTRAVENOUS

## 2012-12-02 MED ORDER — PACLITAXEL PROTEIN-BOUND CHEMO INJECTION 100 MG
80.0000 mg/m2 | Freq: Once | INTRAVENOUS | Status: AC
  Administered 2012-12-02: 175 mg via INTRAVENOUS
  Filled 2012-12-02: qty 35

## 2012-12-02 MED ORDER — SODIUM CHLORIDE 0.9 % IV SOLN
560.0000 mg/m2 | Freq: Once | INTRAVENOUS | Status: AC
  Administered 2012-12-02: 1254 mg via INTRAVENOUS
  Filled 2012-12-02: qty 33

## 2012-12-02 MED ORDER — DEXAMETHASONE SODIUM PHOSPHATE 10 MG/ML IJ SOLN
5.0000 mg | Freq: Once | INTRAMUSCULAR | Status: AC
  Administered 2012-12-02: 5 mg via INTRAVENOUS

## 2012-12-02 NOTE — Patient Instructions (Signed)
[facility] Discharge Instructions for Patients Receiving Chemotherapy  Today you received the following chemotherapy agents Gemzar/Abraxane.  To help prevent nausea and vomiting after your treatment, we encourage you to take your nausea medication as prescribed.   If you develop nausea and vomiting that is not controlled by your nausea medication, call the clinic.   BELOW ARE SYMPTOMS THAT SHOULD BE REPORTED IMMEDIATELY:  *FEVER GREATER THAN 100.5 F  *CHILLS WITH OR WITHOUT FEVER  NAUSEA AND VOMITING THAT IS NOT CONTROLLED WITH YOUR NAUSEA MEDICATION  *UNUSUAL SHORTNESS OF BREATH  *UNUSUAL BRUISING OR BLEEDING  TENDERNESS IN MOUTH AND THROAT WITH OR WITHOUT PRESENCE OF ULCERS  *URINARY PROBLEMS  *BOWEL PROBLEMS  UNUSUAL RASH Items with * indicate a potential emergency and should be followed up as soon as possible.  Feel free to call the clinic you have any questions or concerns. The clinic phone number is [phone].    

## 2012-12-02 NOTE — Progress Notes (Signed)
Patient reports he continues to do well. His weight is stable at 210.9 pounds documented June 13. Patient denies nutrition side effects.  Nutrition diagnosis: Unintended weight loss resolved.  Patient was encouraged to continue small frequent meals with high-protein foods to promote weight maintenance. Patient has my contact information and will call me with questions or concerns.

## 2012-12-05 ENCOUNTER — Telehealth: Payer: Self-pay | Admitting: *Deleted

## 2012-12-05 NOTE — Telephone Encounter (Signed)
Wife called reporting "Christian Kaiser has a severe headache with blurred vision and feels swimmy headed".  Asked about b/p and reports his PCP discontinued one of his b/p meds due to pltc drop.  B/P checked at time of call.  B/P = 125/85 P= 77.  Reports he's drank 16 oz water today and appetite is good.  Denies unsteadiness or dizziness due to the two having taken a thirty minute walk today.  Receives zofran for chemotherapy treatments but uses compazine at home.  Denies nausea.  Asked that he try to drink 64 oz water and to get his bearings and no quick changes in positioning to standing or movement.  No further questions.

## 2012-12-07 ENCOUNTER — Other Ambulatory Visit: Payer: Self-pay | Admitting: Oncology

## 2012-12-09 ENCOUNTER — Other Ambulatory Visit (HOSPITAL_BASED_OUTPATIENT_CLINIC_OR_DEPARTMENT_OTHER): Payer: Medicare Other | Admitting: Lab

## 2012-12-09 ENCOUNTER — Ambulatory Visit: Payer: Medicare Other

## 2012-12-09 ENCOUNTER — Telehealth: Payer: Self-pay | Admitting: Oncology

## 2012-12-09 ENCOUNTER — Telehealth: Payer: Self-pay | Admitting: *Deleted

## 2012-12-09 ENCOUNTER — Ambulatory Visit (HOSPITAL_BASED_OUTPATIENT_CLINIC_OR_DEPARTMENT_OTHER): Payer: Medicare Other | Admitting: Oncology

## 2012-12-09 VITALS — BP 115/68 | HR 71 | Temp 96.9°F | Resp 18 | Ht 72.0 in | Wt 207.8 lb

## 2012-12-09 DIAGNOSIS — C259 Malignant neoplasm of pancreas, unspecified: Secondary | ICD-10-CM

## 2012-12-09 LAB — CBC WITH DIFFERENTIAL/PLATELET
Eosinophils Absolute: 0 10*3/uL (ref 0.0–0.5)
MONO#: 0.2 10*3/uL (ref 0.1–0.9)
MONO%: 6.1 % (ref 0.0–14.0)
NEUT#: 2.2 10*3/uL (ref 1.5–6.5)
RBC: 3.75 10*6/uL — ABNORMAL LOW (ref 4.20–5.82)
RDW: 14.3 % (ref 11.0–14.6)
WBC: 3.8 10*3/uL — ABNORMAL LOW (ref 4.0–10.3)
nRBC: 1 % — ABNORMAL HIGH (ref 0–0)

## 2012-12-09 NOTE — Telephone Encounter (Signed)
Per staff message and POF I have scheduled appts.  JMW  

## 2012-12-09 NOTE — Telephone Encounter (Signed)
gv and printed appt sched and avs for pt...emailed MW to add tx... °

## 2012-12-09 NOTE — Progress Notes (Signed)
   [facility]    OFFICE PROGRESS NOTE   INTERVAL HISTORY:   He returns as scheduled. He was last treated with gemcitabine/Abraxane on 12/02/2012. The chemotherapy was dose reduced with cycle 2. He denies nausea/vomiting, fever, and neuropathy symptoms. He continues to have abdominal pain, but he takes pain medication and frequently. No new complaint.  Objective:  Vital signs in last 24 hours:  Blood pressure 115/68, pulse 71, temperature 96.9 F (36.1 C), temperature source Oral, resp. rate 18, height 6' (1.829 m), weight 207 lb 12.8 oz (94.257 kg), SpO2 98.00%.    HEENT: No thrush or ulcers Resp: Lungs clear bilaterally Cardio: Regular rate and rhythm with premature beats GI: No hepatomegaly, nontender, no mass Vascular: No leg edema  Portacath/PICC-without erythema  Lab Results:  Lab Results  Component Value Date   WBC 3.8* 12/09/2012   HGB 10.6* 12/09/2012   HCT 31.7* 12/09/2012   MCV 84.5 12/09/2012   PLT 72* 12/09/2012   ANC 2.2    Medications: I have reviewed the patient's current medications.  Assessment/Plan: 1. Adenocarcinoma of the pancreas status post ultrasound-guided biopsy of a liver lesion 10/18/2012 consistent with metastatic pancreas cancer. CT 10/11/2012 revealed a pancreas tail mass and liver metastases. CA 19-9 in normal range on 10/24/2012. Initiation of gemcitabine/Abraxane on a day 1, day 8 day 15 schedule on 10/28/2012.  2. Abdominal pain secondary to the pancreas mass. 3. Anorexia/weight loss 4. Diabetes. Exacerbated by steroid premedication. Decadron was reduced to 5 mg after cycle 1 day 1. 5. History of coronary artery disease. 6. Headache following cycle 1 day 1 gemcitabine/Abraxane. Question related to Zofran.  7. Thrombocytopenia secondary to chemotherapy.  Disposition:  He appears stable. He has completed 4 treatments with gemcitabine/Abraxane. [name] has tolerated the chemotherapy well. He has experienced hematologic  toxicity causing treatment delays. The chemotherapy was dose reduced beginning with cycle 2. We will hold chemotherapy again today. The plan is to continue chemotherapy at the current dose on a day 1, day 8 schedule beginning on 12/21/2012. He will return for an office visit on 12/28/2012.  The plan is to schedule a restaging CT evaluation after 2-4 more treatments with chemotherapy.   Thornton Papas, MD  12/09/2012  12:59 PM

## 2012-12-18 ENCOUNTER — Other Ambulatory Visit: Payer: Self-pay | Admitting: Oncology

## 2012-12-21 ENCOUNTER — Ambulatory Visit (HOSPITAL_BASED_OUTPATIENT_CLINIC_OR_DEPARTMENT_OTHER): Payer: Medicare Other | Admitting: Nurse Practitioner

## 2012-12-21 ENCOUNTER — Ambulatory Visit (HOSPITAL_BASED_OUTPATIENT_CLINIC_OR_DEPARTMENT_OTHER): Payer: Medicare Other

## 2012-12-21 ENCOUNTER — Other Ambulatory Visit (HOSPITAL_BASED_OUTPATIENT_CLINIC_OR_DEPARTMENT_OTHER): Payer: Medicare Other | Admitting: Lab

## 2012-12-21 VITALS — BP 133/74 | HR 56 | Temp 98.0°F | Resp 18 | Ht 72.0 in | Wt 210.6 lb

## 2012-12-21 DIAGNOSIS — Z5111 Encounter for antineoplastic chemotherapy: Secondary | ICD-10-CM

## 2012-12-21 DIAGNOSIS — C259 Malignant neoplasm of pancreas, unspecified: Secondary | ICD-10-CM

## 2012-12-21 DIAGNOSIS — C787 Secondary malignant neoplasm of liver and intrahepatic bile duct: Secondary | ICD-10-CM

## 2012-12-21 DIAGNOSIS — C252 Malignant neoplasm of tail of pancreas: Secondary | ICD-10-CM

## 2012-12-21 DIAGNOSIS — E119 Type 2 diabetes mellitus without complications: Secondary | ICD-10-CM

## 2012-12-21 LAB — COMPREHENSIVE METABOLIC PANEL (CC13)
ALT: 14 U/L (ref 0–55)
CO2: 27 mEq/L (ref 22–29)
Chloride: 105 mEq/L (ref 98–109)
Sodium: 138 mEq/L (ref 136–145)
Total Bilirubin: 0.38 mg/dL (ref 0.20–1.20)
Total Protein: 6.8 g/dL (ref 6.4–8.3)

## 2012-12-21 LAB — CBC WITH DIFFERENTIAL/PLATELET
BASO%: 0.6 % (ref 0.0–2.0)
EOS%: 2.9 % (ref 0.0–7.0)
LYMPH%: 25.4 % (ref 14.0–49.0)
MCHC: 32.7 g/dL (ref 32.0–36.0)
MONO#: 0.6 10*3/uL (ref 0.1–0.9)
MONO%: 11.5 % (ref 0.0–14.0)
Platelets: 277 10*3/uL (ref 140–400)
RBC: 3.97 10*6/uL — ABNORMAL LOW (ref 4.20–5.82)
WBC: 5.2 10*3/uL (ref 4.0–10.3)
nRBC: 0 % (ref 0–0)

## 2012-12-21 MED ORDER — SODIUM CHLORIDE 0.9 % IV SOLN
Freq: Once | INTRAVENOUS | Status: AC
  Administered 2012-12-21: 13:00:00 via INTRAVENOUS

## 2012-12-21 MED ORDER — SODIUM CHLORIDE 0.9 % IV SOLN
560.0000 mg/m2 | Freq: Once | INTRAVENOUS | Status: AC
  Administered 2012-12-21: 1254 mg via INTRAVENOUS
  Filled 2012-12-21: qty 33

## 2012-12-21 MED ORDER — PACLITAXEL PROTEIN-BOUND CHEMO INJECTION 100 MG
80.0000 mg/m2 | Freq: Once | INTRAVENOUS | Status: AC
  Administered 2012-12-21: 175 mg via INTRAVENOUS
  Filled 2012-12-21: qty 35

## 2012-12-21 MED ORDER — DEXAMETHASONE SODIUM PHOSPHATE 10 MG/ML IJ SOLN
5.0000 mg | Freq: Once | INTRAMUSCULAR | Status: AC
  Administered 2012-12-21: 5 mg via INTRAVENOUS

## 2012-12-21 MED ORDER — ONDANSETRON 8 MG/50ML IVPB (CHCC)
8.0000 mg | Freq: Once | INTRAVENOUS | Status: AC
  Administered 2012-12-21: 8 mg via INTRAVENOUS

## 2012-12-21 MED ORDER — HEPARIN SOD (PORK) LOCK FLUSH 100 UNIT/ML IV SOLN
500.0000 [IU] | Freq: Once | INTRAVENOUS | Status: AC | PRN
  Administered 2012-12-21: 500 [IU]
  Filled 2012-12-21: qty 5

## 2012-12-21 MED ORDER — SODIUM CHLORIDE 0.9 % IJ SOLN
10.0000 mL | INTRAMUSCULAR | Status: DC | PRN
  Administered 2012-12-21: 10 mL
  Filled 2012-12-21: qty 10

## 2012-12-21 NOTE — Progress Notes (Signed)
OFFICE PROGRESS NOTE  Interval history:  Mr. Rodenberg returns as scheduled. He was last treated with gemcitabine/Abraxane on 12/02/2012. He denies nausea/vomiting. No mouth sores. No diarrhea. No skin rash. No fever. He denies shortness of breath. He had a cough earlier today. No numbness or tingling in his hands or feet. Appetite is better. He is no longer experiencing abdominal pain.   Objective: Blood pressure 133/74, pulse 56, temperature 98 F (36.7 C), temperature source Oral, resp. rate 18, height 6' (1.829 m), weight 210 lb 9.6 oz (95.528 kg).  Oropharynx is without thrush or ulceration. Lungs are clear. No wheezes or rales. Regular cardiac rhythm with premature beats. Port-A-Cath site is without erythema. Abdomen soft and nontender. No hepatomegaly. No mass. Extremities without edema.  Lab Results: Lab Results  Component Value Date   WBC 5.2 12/21/2012   HGB 11.1* 12/21/2012   HCT 33.9* 12/21/2012   MCV 85.4 12/21/2012   PLT 277 12/21/2012    Chemistry:    Chemistry      Component Value Date/Time   NA 139 11/25/2012 0934   NA 135 11/28/2011 0515   K 4.5 11/25/2012 0934   K 4.0 11/28/2011 0515   CL 107 11/25/2012 0934   CL 101 11/28/2011 0515   CO2 23 11/25/2012 0934   CO2 22 11/28/2011 0515   BUN 13.7 11/25/2012 0934   BUN 26* 11/28/2011 0515   CREATININE 1.2 11/25/2012 0934   CREATININE 1.29 11/28/2011 0515      Component Value Date/Time   CALCIUM 9.4 11/25/2012 0934   CALCIUM 9.2 11/28/2011 0515   ALKPHOS 54 11/25/2012 0934   ALKPHOS 43 11/27/2011 2229   AST 11 11/25/2012 0934   AST 14 11/27/2011 2229   ALT 14 11/25/2012 0934   ALT 13 11/27/2011 2229   BILITOT 0.51 11/25/2012 0934   BILITOT 0.2* 11/27/2011 2229       Studies/Results: No results found.  Medications: I have reviewed the patient's current medications.  Assessment/Plan:  1. Adenocarcinoma of the pancreas status post ultrasound-guided biopsy of a liver lesion 10/18/2012 consistent with metastatic pancreas cancer. CT  10/11/2012 revealed a pancreas tail mass and liver metastases. CA 19-9 in normal range on 10/24/2012. Initiation of gemcitabine/Abraxane on a day 1, day 8 day 15 schedule on 10/28/2012.  2. Abdominal pain secondary to the pancreas mass. He is no longer having abdominal pain. 3. Anorexia/weight loss. Improved. 4. Diabetes. Exacerbated by steroid premedication. Decadron was reduced to 5 mg after cycle 1 day 1. 5. History of coronary artery disease. 6. Headache following cycle 1 day 1 gemcitabine/Abraxane. Question related to Zofran.  7. Thrombocytopenia secondary to chemotherapy. Improved. The platelet count is normal today.  Disposition-Christian Kaiser appears well. He has completed 4 treatments with gemcitabine/Abraxane. Plan to proceed with cycle 3 (day 1/day 8 schedule) today. He will return for the day 8 treatment on 12/28/2012. He will return for a followup visit and to begin cycle 4 on 01/11/2013.  Restaging CT evaluation is planned after completion of cycle 4. He will contact the office in the interim with any problems.  Plan reviewed with Dr. Truett Perna.  Lonna Cobb ANP/GNP-BC

## 2012-12-21 NOTE — Patient Instructions (Addendum)
[facility] Discharge Instructions for Patients Receiving Chemotherapy  Today you received the following chemotherapy agents Abraxane, Gemzar  To help prevent nausea and vomiting after your treatment, we encourage you to take your nausea medication     If you develop nausea and vomiting that is not controlled by your nausea medication, call the clinic.   BELOW ARE SYMPTOMS THAT SHOULD BE REPORTED IMMEDIATELY:  *FEVER GREATER THAN 100.5 F  *CHILLS WITH OR WITHOUT FEVER  NAUSEA AND VOMITING THAT IS NOT CONTROLLED WITH YOUR NAUSEA MEDICATION  *UNUSUAL SHORTNESS OF BREATH  *UNUSUAL BRUISING OR BLEEDING  TENDERNESS IN MOUTH AND THROAT WITH OR WITHOUT PRESENCE OF ULCERS  *URINARY PROBLEMS  *BOWEL PROBLEMS  UNUSUAL RASH Items with * indicate a potential emergency and should be followed up as soon as possible.  Feel free to call the clinic you have any questions or concerns. The clinic phone number is [phone].    

## 2012-12-22 ENCOUNTER — Encounter: Payer: Self-pay | Admitting: Oncology

## 2012-12-22 NOTE — Progress Notes (Signed)
Faxed application to [employer] for Abraxane.

## 2012-12-23 ENCOUNTER — Telehealth: Payer: Self-pay | Admitting: Oncology

## 2012-12-23 ENCOUNTER — Telehealth: Payer: Self-pay | Admitting: *Deleted

## 2012-12-23 NOTE — Telephone Encounter (Signed)
Per staff message and POF I have scheduled appts.  JMW  

## 2012-12-28 ENCOUNTER — Ambulatory Visit (HOSPITAL_BASED_OUTPATIENT_CLINIC_OR_DEPARTMENT_OTHER): Payer: Medicare Other

## 2012-12-28 ENCOUNTER — Other Ambulatory Visit (HOSPITAL_BASED_OUTPATIENT_CLINIC_OR_DEPARTMENT_OTHER): Payer: Medicare Other | Admitting: Lab

## 2012-12-28 VITALS — BP 104/52 | HR 55 | Temp 97.6°F

## 2012-12-28 DIAGNOSIS — C259 Malignant neoplasm of pancreas, unspecified: Secondary | ICD-10-CM

## 2012-12-28 DIAGNOSIS — Z5111 Encounter for antineoplastic chemotherapy: Secondary | ICD-10-CM

## 2012-12-28 DIAGNOSIS — C252 Malignant neoplasm of tail of pancreas: Secondary | ICD-10-CM

## 2012-12-28 DIAGNOSIS — C787 Secondary malignant neoplasm of liver and intrahepatic bile duct: Secondary | ICD-10-CM

## 2012-12-28 LAB — CBC WITH DIFFERENTIAL/PLATELET
BASO%: 0.9 % (ref 0.0–2.0)
HCT: 32.3 % — ABNORMAL LOW (ref 38.4–49.9)
LYMPH%: 29.8 % (ref 14.0–49.0)
MCHC: 33.1 g/dL (ref 32.0–36.0)
MCV: 85 fL (ref 79.3–98.0)
MONO#: 0.3 10*3/uL (ref 0.1–0.9)
MONO%: 7.8 % (ref 0.0–14.0)
NEUT%: 60.6 % (ref 39.0–75.0)
Platelets: 171 10*3/uL (ref 140–400)
RBC: 3.8 10*6/uL — ABNORMAL LOW (ref 4.20–5.82)
WBC: 3.2 10*3/uL — ABNORMAL LOW (ref 4.0–10.3)

## 2012-12-28 MED ORDER — PACLITAXEL PROTEIN-BOUND CHEMO INJECTION 100 MG
80.0000 mg/m2 | Freq: Once | INTRAVENOUS | Status: AC
  Administered 2012-12-28: 175 mg via INTRAVENOUS
  Filled 2012-12-28: qty 35

## 2012-12-28 MED ORDER — SODIUM CHLORIDE 0.9 % IJ SOLN
10.0000 mL | INTRAMUSCULAR | Status: DC | PRN
  Administered 2012-12-28: 10 mL
  Filled 2012-12-28: qty 10

## 2012-12-28 MED ORDER — HEPARIN SOD (PORK) LOCK FLUSH 100 UNIT/ML IV SOLN
500.0000 [IU] | Freq: Once | INTRAVENOUS | Status: AC | PRN
  Administered 2012-12-28: 500 [IU]
  Filled 2012-12-28: qty 5

## 2012-12-28 MED ORDER — DEXAMETHASONE SODIUM PHOSPHATE 10 MG/ML IJ SOLN
5.0000 mg | Freq: Once | INTRAMUSCULAR | Status: AC
Start: 2012-12-28 — End: 2012-12-28
  Administered 2012-12-28: 5 mg via INTRAVENOUS

## 2012-12-28 MED ORDER — SODIUM CHLORIDE 0.9 % IV SOLN
Freq: Once | INTRAVENOUS | Status: AC
  Administered 2012-12-28: 11:00:00 via INTRAVENOUS

## 2012-12-28 MED ORDER — SODIUM CHLORIDE 0.9 % IV SOLN
560.0000 mg/m2 | Freq: Once | INTRAVENOUS | Status: AC
  Administered 2012-12-28: 1254 mg via INTRAVENOUS
  Filled 2012-12-28: qty 33

## 2012-12-28 MED ORDER — ONDANSETRON 8 MG/50ML IVPB (CHCC)
8.0000 mg | Freq: Once | INTRAVENOUS | Status: AC
  Administered 2012-12-28: 8 mg via INTRAVENOUS

## 2012-12-28 NOTE — Patient Instructions (Addendum)
[facility] Discharge Instructions for Patients Receiving Chemotherapy  Today you received the following chemotherapy agents Gemzar/Abraxane.  To help prevent nausea and vomiting after your treatment, we encourage you to take your nausea medication as prescribed.   If you develop nausea and vomiting that is not controlled by your nausea medication, call the clinic.   BELOW ARE SYMPTOMS THAT SHOULD BE REPORTED IMMEDIATELY:  *FEVER GREATER THAN 100.5 F  *CHILLS WITH OR WITHOUT FEVER  NAUSEA AND VOMITING THAT IS NOT CONTROLLED WITH YOUR NAUSEA MEDICATION  *UNUSUAL SHORTNESS OF BREATH  *UNUSUAL BRUISING OR BLEEDING  TENDERNESS IN MOUTH AND THROAT WITH OR WITHOUT PRESENCE OF ULCERS  *URINARY PROBLEMS  *BOWEL PROBLEMS  UNUSUAL RASH Items with * indicate a potential emergency and should be followed up as soon as possible.  Feel free to call the clinic you have any questions or concerns. The clinic phone number is [phone].    

## 2013-01-05 ENCOUNTER — Other Ambulatory Visit: Payer: Self-pay | Admitting: Oncology

## 2013-01-11 ENCOUNTER — Ambulatory Visit (HOSPITAL_BASED_OUTPATIENT_CLINIC_OR_DEPARTMENT_OTHER): Payer: Medicare Other | Admitting: Nurse Practitioner

## 2013-01-11 ENCOUNTER — Other Ambulatory Visit (HOSPITAL_BASED_OUTPATIENT_CLINIC_OR_DEPARTMENT_OTHER): Payer: Medicare Other | Admitting: Lab

## 2013-01-11 ENCOUNTER — Ambulatory Visit (HOSPITAL_BASED_OUTPATIENT_CLINIC_OR_DEPARTMENT_OTHER): Payer: Medicare Other

## 2013-01-11 ENCOUNTER — Telehealth: Payer: Self-pay | Admitting: Oncology

## 2013-01-11 VITALS — BP 121/59 | HR 71 | Temp 97.2°F | Resp 18 | Ht 72.0 in | Wt 211.6 lb

## 2013-01-11 DIAGNOSIS — C787 Secondary malignant neoplasm of liver and intrahepatic bile duct: Secondary | ICD-10-CM

## 2013-01-11 DIAGNOSIS — C259 Malignant neoplasm of pancreas, unspecified: Secondary | ICD-10-CM

## 2013-01-11 DIAGNOSIS — E119 Type 2 diabetes mellitus without complications: Secondary | ICD-10-CM

## 2013-01-11 DIAGNOSIS — Z5111 Encounter for antineoplastic chemotherapy: Secondary | ICD-10-CM

## 2013-01-11 DIAGNOSIS — C252 Malignant neoplasm of tail of pancreas: Secondary | ICD-10-CM

## 2013-01-11 LAB — CBC WITH DIFFERENTIAL/PLATELET
BASO%: 0.6 % (ref 0.0–2.0)
EOS%: 2.3 % (ref 0.0–7.0)
HCT: 32.5 % — ABNORMAL LOW (ref 38.4–49.9)
LYMPH%: 18.8 % (ref 14.0–49.0)
MCH: 28.6 pg (ref 27.2–33.4)
MCHC: 33.2 g/dL (ref 32.0–36.0)
MCV: 86.2 fL (ref 79.3–98.0)
MONO#: 0.7 10*3/uL (ref 0.1–0.9)
MONO%: 12.9 % (ref 0.0–14.0)
NEUT%: 65.4 % (ref 39.0–75.0)
Platelets: 158 10*3/uL (ref 140–400)
RBC: 3.77 10*6/uL — ABNORMAL LOW (ref 4.20–5.82)
WBC: 5.2 10*3/uL (ref 4.0–10.3)
nRBC: 0 % (ref 0–0)

## 2013-01-11 LAB — COMPREHENSIVE METABOLIC PANEL (CC13)
ALT: 14 U/L (ref 0–55)
AST: 15 U/L (ref 5–34)
BUN: 13.3 mg/dL (ref 7.0–26.0)
Calcium: 9.1 mg/dL (ref 8.4–10.4)
Chloride: 109 mEq/L (ref 98–109)
Creatinine: 1.2 mg/dL (ref 0.7–1.3)
Total Bilirubin: 0.35 mg/dL (ref 0.20–1.20)

## 2013-01-11 MED ORDER — SODIUM CHLORIDE 0.9 % IJ SOLN
10.0000 mL | INTRAMUSCULAR | Status: DC | PRN
  Administered 2013-01-11: 10 mL
  Filled 2013-01-11: qty 10

## 2013-01-11 MED ORDER — SODIUM CHLORIDE 0.9 % IV SOLN
Freq: Once | INTRAVENOUS | Status: AC
  Administered 2013-01-11: 13:00:00 via INTRAVENOUS

## 2013-01-11 MED ORDER — HEPARIN SOD (PORK) LOCK FLUSH 100 UNIT/ML IV SOLN
500.0000 [IU] | Freq: Once | INTRAVENOUS | Status: AC | PRN
  Administered 2013-01-11: 500 [IU]
  Filled 2013-01-11: qty 5

## 2013-01-11 MED ORDER — PACLITAXEL PROTEIN-BOUND CHEMO INJECTION 100 MG
80.0000 mg/m2 | Freq: Once | INTRAVENOUS | Status: AC
  Administered 2013-01-11: 175 mg via INTRAVENOUS
  Filled 2013-01-11: qty 35

## 2013-01-11 MED ORDER — DEXAMETHASONE SODIUM PHOSPHATE 10 MG/ML IJ SOLN
5.0000 mg | Freq: Once | INTRAMUSCULAR | Status: AC
  Administered 2013-01-11: 5 mg via INTRAVENOUS

## 2013-01-11 MED ORDER — ONDANSETRON 8 MG/50ML IVPB (CHCC)
8.0000 mg | Freq: Once | INTRAVENOUS | Status: AC
  Administered 2013-01-11: 8 mg via INTRAVENOUS

## 2013-01-11 MED ORDER — SODIUM CHLORIDE 0.9 % IV SOLN
560.0000 mg/m2 | Freq: Once | INTRAVENOUS | Status: AC
  Administered 2013-01-11: 1254 mg via INTRAVENOUS
  Filled 2013-01-11: qty 33

## 2013-01-11 NOTE — Patient Instructions (Addendum)
[facility] Discharge Instructions for Patients Receiving Chemotherapy  Today you received the following chemotherapy agents: gemzar, abraxane  To help prevent nausea and vomiting after your treatment, we encourage you to take your nausea medication.  Take it as often as prescribed.     If you develop nausea and vomiting that is not controlled by your nausea medication, call the clinic. If it is after clinic hours your family physician or the after hours number for the clinic or go to the Emergency Department.   BELOW ARE SYMPTOMS THAT SHOULD BE REPORTED IMMEDIATELY:  *FEVER GREATER THAN 100.5 F  *CHILLS WITH OR WITHOUT FEVER  NAUSEA AND VOMITING THAT IS NOT CONTROLLED WITH YOUR NAUSEA MEDICATION  *UNUSUAL SHORTNESS OF BREATH  *UNUSUAL BRUISING OR BLEEDING  TENDERNESS IN MOUTH AND THROAT WITH OR WITHOUT PRESENCE OF ULCERS  *URINARY PROBLEMS  *BOWEL PROBLEMS  UNUSUAL RASH Items with * indicate a potential emergency and should be followed up as soon as possible.  Feel free to call the clinic you have any questions or concerns. The clinic phone number is [phone].   I have been informed and understand all the instructions given to me. I know to contact the clinic, my physician, or go to the Emergency Department if any problems should occur. I do not have any questions at this time, but understand that I may call the clinic during office hours   should I have any questions or need assistance in obtaining follow up care.    __________________________________________  _____________  __________ Signature of Patient or Authorized Representative            Date                   Time    __________________________________________ Nurse's Signature

## 2013-01-11 NOTE — Telephone Encounter (Signed)
gave pt oral contrast for CT, printed appt calendar and AVS

## 2013-01-11 NOTE — Progress Notes (Signed)
OFFICE PROGRESS NOTE  Interval history:  [name] returns as scheduled. He was last treated with gemcitabine/Abraxane on 12/28/2012. He denies nausea/vomiting. No mouth sores. No diarrhea. No numbness or tingling in his hands or feet. Abdominal pain continues to be resolved. Oral intake is described as "fair". He is walking about 1 mile a day.   Objective: Blood pressure 121/59, pulse 71, temperature 97.2 F (36.2 C), temperature source Oral, resp. rate 18, height 6' (1.829 m), weight 211 lb 9.6 oz (95.981 kg).  No thrush or ulcerations. Lungs clear. Regular cardiac rhythm with premature beats. Port-A-Cath site is without erythema. Abdomen is soft and nontender. No hepatomegaly. No mass. Extremities without edema. Vibratory sense intact over the fingertips per tuning fork exam.  Lab Results: Lab Results  Component Value Date   WBC 5.2 01/11/2013   HGB 10.8* 01/11/2013   HCT 32.5* 01/11/2013   MCV 86.2 01/11/2013   PLT 158 01/11/2013    Chemistry:    Chemistry      Component Value Date/Time   NA 138 12/21/2012 1050   NA 135 11/28/2011 0515   K 4.3 12/21/2012 1050   K 4.0 11/28/2011 0515   CL 107 11/25/2012 0934   CL 101 11/28/2011 0515   CO2 27 12/21/2012 1050   CO2 22 11/28/2011 0515   BUN 16.5 12/21/2012 1050   BUN 26* 11/28/2011 0515   CREATININE 1.1 12/21/2012 1050   CREATININE 1.29 11/28/2011 0515      Component Value Date/Time   CALCIUM 9.5 12/21/2012 1050   CALCIUM 9.2 11/28/2011 0515   ALKPHOS 54 12/21/2012 1050   ALKPHOS 43 11/27/2011 2229   AST 13 12/21/2012 1050   AST 14 11/27/2011 2229   ALT 14 12/21/2012 1050   ALT 13 11/27/2011 2229   BILITOT 0.38 12/21/2012 1050   BILITOT 0.2* 11/27/2011 2229       Studies/Results: No results found.  Medications: I have reviewed the patient's current medications.  Assessment/Plan:  1. Adenocarcinoma of the pancreas status post ultrasound-guided biopsy of a liver lesion 10/18/2012 consistent with metastatic pancreas cancer. CT 10/11/2012  revealed a pancreas tail mass and liver metastases. CA 19-9 in normal range on 10/24/2012. Initiation of gemcitabine/Abraxane on a day 1, day 8 day 15 schedule on 10/28/2012. Current schedule is day one/day 8. He has completed 3 cycles. 2. Abdominal pain secondary to the pancreas mass. He is no longer having abdominal pain. 3. Anorexia/weight loss. Improved. 4. Diabetes. Exacerbated by steroid premedication. Decadron was reduced to 5 mg after cycle 1 day 1. 5. History of coronary artery disease. 6. Headache following cycle 1 day 1 gemcitabine/Abraxane. Question related to Zofran.  7. Thrombocytopenia secondary to chemotherapy. Improved. The platelet count is normal today.  Disposition-Christian Kaiser appears stable. He has completed 3 cycles of gemcitabine/Abraxane. Abdominal pain continues to be resolved. Plan to proceed with cycle 4 today as scheduled. He will return for the day 8 treatment on 01/18/2013. Restaging CT evaluation planned the week of 01/23/2013. He will return for a followup visit on 02/01/2013. He knows to contact the office in the interim with any problems.  Lonna Cobb ANP/GNP-BC

## 2013-01-18 ENCOUNTER — Other Ambulatory Visit (HOSPITAL_BASED_OUTPATIENT_CLINIC_OR_DEPARTMENT_OTHER): Payer: Medicare Other | Admitting: Lab

## 2013-01-18 ENCOUNTER — Ambulatory Visit (HOSPITAL_BASED_OUTPATIENT_CLINIC_OR_DEPARTMENT_OTHER): Payer: Medicare Other

## 2013-01-18 VITALS — BP 130/70 | HR 76 | Temp 97.7°F

## 2013-01-18 DIAGNOSIS — C787 Secondary malignant neoplasm of liver and intrahepatic bile duct: Secondary | ICD-10-CM

## 2013-01-18 DIAGNOSIS — C259 Malignant neoplasm of pancreas, unspecified: Secondary | ICD-10-CM

## 2013-01-18 DIAGNOSIS — Z5111 Encounter for antineoplastic chemotherapy: Secondary | ICD-10-CM

## 2013-01-18 DIAGNOSIS — C252 Malignant neoplasm of tail of pancreas: Secondary | ICD-10-CM

## 2013-01-18 LAB — CBC WITH DIFFERENTIAL/PLATELET
EOS%: 1.1 % (ref 0.0–7.0)
MCH: 28.4 pg (ref 27.2–33.4)
MCV: 86.8 fL (ref 79.3–98.0)
MONO%: 2.6 % (ref 0.0–14.0)
RBC: 3.56 10*6/uL — ABNORMAL LOW (ref 4.20–5.82)
RDW: 15.5 % — ABNORMAL HIGH (ref 11.0–14.6)
nRBC: 1 % — ABNORMAL HIGH (ref 0–0)

## 2013-01-18 MED ORDER — HEPARIN SOD (PORK) LOCK FLUSH 100 UNIT/ML IV SOLN
500.0000 [IU] | Freq: Once | INTRAVENOUS | Status: AC | PRN
  Administered 2013-01-18: 500 [IU]
  Filled 2013-01-18: qty 5

## 2013-01-18 MED ORDER — PACLITAXEL PROTEIN-BOUND CHEMO INJECTION 100 MG
80.0000 mg/m2 | Freq: Once | INTRAVENOUS | Status: AC
  Administered 2013-01-18: 175 mg via INTRAVENOUS
  Filled 2013-01-18: qty 35

## 2013-01-18 MED ORDER — ONDANSETRON 8 MG/50ML IVPB (CHCC)
8.0000 mg | Freq: Once | INTRAVENOUS | Status: AC
  Administered 2013-01-18: 8 mg via INTRAVENOUS

## 2013-01-18 MED ORDER — SODIUM CHLORIDE 0.9 % IV SOLN
Freq: Once | INTRAVENOUS | Status: AC
  Administered 2013-01-18: 11:00:00 via INTRAVENOUS

## 2013-01-18 MED ORDER — DEXAMETHASONE SODIUM PHOSPHATE 10 MG/ML IJ SOLN
5.0000 mg | Freq: Once | INTRAMUSCULAR | Status: AC
  Administered 2013-01-18: 5 mg via INTRAVENOUS

## 2013-01-18 MED ORDER — SODIUM CHLORIDE 0.9 % IV SOLN
560.0000 mg/m2 | Freq: Once | INTRAVENOUS | Status: AC
  Administered 2013-01-18: 1254 mg via INTRAVENOUS
  Filled 2013-01-18: qty 33

## 2013-01-18 MED ORDER — SODIUM CHLORIDE 0.9 % IJ SOLN
10.0000 mL | INTRAMUSCULAR | Status: DC | PRN
  Administered 2013-01-18: 10 mL
  Filled 2013-01-18: qty 10

## 2013-01-18 NOTE — Patient Instructions (Addendum)
[facility] Discharge Instructions for Patients Receiving Chemotherapy  Today you received the following chemotherapy agents gemzar, abraxane  To help prevent nausea and vomiting after your treatment, we encourage you to take your nausea medication as needed   If you develop nausea and vomiting that is not controlled by your nausea medication, call the clinic.   BELOW ARE SYMPTOMS THAT SHOULD BE REPORTED IMMEDIATELY:  *FEVER GREATER THAN 100.5 F  *CHILLS WITH OR WITHOUT FEVER  NAUSEA AND VOMITING THAT IS NOT CONTROLLED WITH YOUR NAUSEA MEDICATION  *UNUSUAL SHORTNESS OF BREATH  *UNUSUAL BRUISING OR BLEEDING  TENDERNESS IN MOUTH AND THROAT WITH OR WITHOUT PRESENCE OF ULCERS  *URINARY PROBLEMS  *BOWEL PROBLEMS  UNUSUAL RASH Items with * indicate a potential emergency and should be followed up as soon as possible.  Feel free to call the clinic you have any questions or concerns. The clinic phone number is [phone].

## 2013-01-26 ENCOUNTER — Ambulatory Visit (HOSPITAL_COMMUNITY)
Admission: RE | Admit: 2013-01-26 | Discharge: 2013-01-26 | Disposition: A | Discharge status: Home / Self Care | Payer: Medicare Other | Source: Ambulatory Visit | Attending: Nurse Practitioner | Admitting: Nurse Practitioner

## 2013-01-26 ENCOUNTER — Encounter (HOSPITAL_COMMUNITY): Payer: Self-pay

## 2013-01-26 DIAGNOSIS — R918 Other nonspecific abnormal finding of lung field: Secondary | ICD-10-CM | POA: Insufficient documentation

## 2013-01-26 DIAGNOSIS — C259 Malignant neoplasm of pancreas, unspecified: Secondary | ICD-10-CM | POA: Insufficient documentation

## 2013-01-26 DIAGNOSIS — N281 Cyst of kidney, acquired: Secondary | ICD-10-CM | POA: Insufficient documentation

## 2013-01-26 DIAGNOSIS — I714 Abdominal aortic aneurysm, without rupture, unspecified: Secondary | ICD-10-CM | POA: Insufficient documentation

## 2013-01-26 DIAGNOSIS — E278 Other specified disorders of adrenal gland: Secondary | ICD-10-CM | POA: Insufficient documentation

## 2013-01-26 DIAGNOSIS — C787 Secondary malignant neoplasm of liver and intrahepatic bile duct: Secondary | ICD-10-CM | POA: Insufficient documentation

## 2013-01-26 MED ORDER — IOHEXOL 300 MG/ML  SOLN
100.0000 mL | Freq: Once | INTRAMUSCULAR | Status: AC | PRN
  Administered 2013-01-26: 100 mL via INTRAVENOUS

## 2013-01-28 ENCOUNTER — Other Ambulatory Visit: Payer: Self-pay | Admitting: Oncology

## 2013-02-01 ENCOUNTER — Telehealth: Payer: Self-pay | Admitting: Oncology

## 2013-02-01 ENCOUNTER — Ambulatory Visit (HOSPITAL_BASED_OUTPATIENT_CLINIC_OR_DEPARTMENT_OTHER): Payer: Medicare Other | Admitting: Oncology

## 2013-02-01 ENCOUNTER — Other Ambulatory Visit (HOSPITAL_BASED_OUTPATIENT_CLINIC_OR_DEPARTMENT_OTHER): Payer: Medicare Other

## 2013-02-01 ENCOUNTER — Ambulatory Visit (HOSPITAL_BASED_OUTPATIENT_CLINIC_OR_DEPARTMENT_OTHER): Payer: Medicare Other

## 2013-02-01 ENCOUNTER — Telehealth: Payer: Self-pay | Admitting: *Deleted

## 2013-02-01 VITALS — BP 126/76 | HR 80 | Temp 97.1°F | Resp 18 | Ht 72.0 in | Wt 215.3 lb

## 2013-02-01 DIAGNOSIS — C252 Malignant neoplasm of tail of pancreas: Secondary | ICD-10-CM

## 2013-02-01 DIAGNOSIS — C787 Secondary malignant neoplasm of liver and intrahepatic bile duct: Secondary | ICD-10-CM

## 2013-02-01 DIAGNOSIS — C259 Malignant neoplasm of pancreas, unspecified: Secondary | ICD-10-CM

## 2013-02-01 DIAGNOSIS — D6959 Other secondary thrombocytopenia: Secondary | ICD-10-CM

## 2013-02-01 DIAGNOSIS — Z5111 Encounter for antineoplastic chemotherapy: Secondary | ICD-10-CM

## 2013-02-01 DIAGNOSIS — E119 Type 2 diabetes mellitus without complications: Secondary | ICD-10-CM

## 2013-02-01 LAB — COMPREHENSIVE METABOLIC PANEL (CC13)
Alkaline Phosphatase: 46 U/L (ref 40–150)
Glucose: 302 mg/dl — ABNORMAL HIGH (ref 70–140)
Sodium: 139 mEq/L (ref 136–145)
Total Bilirubin: 0.42 mg/dL (ref 0.20–1.20)
Total Protein: 6.6 g/dL (ref 6.4–8.3)

## 2013-02-01 LAB — CBC WITH DIFFERENTIAL/PLATELET
BASO%: 0.7 % (ref 0.0–2.0)
Basophils Absolute: 0 10*3/uL (ref 0.0–0.1)
EOS%: 2.4 % (ref 0.0–7.0)
MCH: 28.9 pg (ref 27.2–33.4)
MCHC: 33 g/dL (ref 32.0–36.0)
MCV: 87.6 fL (ref 79.3–98.0)
MONO%: 8.7 % (ref 0.0–14.0)
NEUT#: 2.9 10*3/uL (ref 1.5–6.5)
RBC: 3.63 10*6/uL — ABNORMAL LOW (ref 4.20–5.82)
RDW: 16.7 % — ABNORMAL HIGH (ref 11.0–14.6)
WBC: 4.6 10*3/uL (ref 4.0–10.3)
nRBC: 1 % — ABNORMAL HIGH (ref 0–0)

## 2013-02-01 MED ORDER — SODIUM CHLORIDE 0.9 % IV SOLN
Freq: Once | INTRAVENOUS | Status: AC
  Administered 2013-02-01: 11:00:00 via INTRAVENOUS

## 2013-02-01 MED ORDER — DEXAMETHASONE SODIUM PHOSPHATE 10 MG/ML IJ SOLN
5.0000 mg | Freq: Once | INTRAMUSCULAR | Status: AC
  Administered 2013-02-01: 5 mg via INTRAVENOUS

## 2013-02-01 MED ORDER — SODIUM CHLORIDE 0.9 % IV SOLN
560.0000 mg/m2 | Freq: Once | INTRAVENOUS | Status: AC
  Administered 2013-02-01: 1254 mg via INTRAVENOUS
  Filled 2013-02-01: qty 32.98

## 2013-02-01 MED ORDER — ONDANSETRON 8 MG/50ML IVPB (CHCC)
8.0000 mg | Freq: Once | INTRAVENOUS | Status: AC
  Administered 2013-02-01: 8 mg via INTRAVENOUS

## 2013-02-01 MED ORDER — PACLITAXEL PROTEIN-BOUND CHEMO INJECTION 100 MG
80.0000 mg/m2 | Freq: Once | INTRAVENOUS | Status: AC
  Administered 2013-02-01: 175 mg via INTRAVENOUS
  Filled 2013-02-01: qty 35

## 2013-02-01 MED ORDER — HEPARIN SOD (PORK) LOCK FLUSH 100 UNIT/ML IV SOLN
500.0000 [IU] | Freq: Once | INTRAVENOUS | Status: AC | PRN
  Administered 2013-02-01: 500 [IU]
  Filled 2013-02-01: qty 5

## 2013-02-01 MED ORDER — SODIUM CHLORIDE 0.9 % IJ SOLN
10.0000 mL | INTRAMUSCULAR | Status: DC | PRN
  Administered 2013-02-01: 10 mL
  Filled 2013-02-01: qty 10

## 2013-02-01 NOTE — Telephone Encounter (Signed)
gave pt appt for lab, chemo and ML for August and September 2014

## 2013-02-01 NOTE — Progress Notes (Signed)
   [facility]    OFFICE PROGRESS NOTE   INTERVAL HISTORY:   He returns as scheduled. He was last treated with chemotherapy on 01/18/2013. He reports tolerating the chemotherapy well. No fever or neuropathy symptoms. He has a good appetite and energy level. He is mowing the lawn. The abdominal pain has improved. His only complaint is "stiffness "in the legs when going upstairs.  Objective:  Vital signs in last 24 hours:  Blood pressure 126/76, pulse 80, temperature 97.1 F (36.2 C), temperature source Oral, resp. rate 18, height 6' (1.829 m), weight 215 lb 4.8 oz (97.659 kg).    HEENT: no thrush or ulcers Resp: lungs clear bilaterally Cardio: regular rate and rhythm GI:  No hepatomegaly, nontender, no mass Vascular:  No leg edema Neurologic: The leg and foot strength are intact bilaterally   Portacath/PICC-without erythema  Lab Results:  Lab Results  Component Value Date   WBC 4.6 02/01/2013   HGB 10.5* 02/01/2013   HCT 31.8* 02/01/2013   MCV 87.6 02/01/2013   PLT 136* 02/01/2013   ANC 2.9  X-rays:CT of the abdomen and pelvis on 01/26/2013, compared to 01/11/2013-stable pancreas body/tail mass. Decrease in the size of liver metastases. No new lesions.   Medications: I have reviewed the patient's current medications.  Assessment/Plan: 1. Adenocarcinoma of the pancreas status post ultrasound-guided biopsy of a liver lesion 10/18/2012 consistent with metastatic pancreas cancer. CT 10/11/2012 revealed a pancreas tail mass and liver metastases. CA 19-9 in normal range on 10/24/2012. Initiation of gemcitabine/Abraxane on a day 1, day 8 day 15 schedule on 10/28/2012. Current schedule is day one/day 8. He has completed 4 cycles. -restaging CT 01/26/2013 revealed a decrease in the size of liver metastases and no new lesions 2. Abdominal pain secondary to the pancreas mass. He is no longer having abdominal pain. 3. Anorexia/weight loss. Improved. 4. Diabetes.  Exacerbated by steroid premedication. Decadron was reduced to 5 mg after cycle 1 day 1. 5. History of coronary artery disease. 6. Headache following cycle 1 day 1 gemcitabine/Abraxane. Question related to Zofran.         7. Thrombocytopenia secondary to chemotherapy.  Disposition:  He has been treated with gemcitabine/Abraxane for the past 3 months. There has been clinical and x-ray in improvement in the metastatic pancreas cancer. I discussed the restaging CT findings with Mr. Carattini and his wife. The plan is to continue gemcitabine/Abraxane on a day one/day 8 schedule. He will return for an office visit on 02/22/2013.   Thornton Papas, MD  02/01/2013  9:58 AM

## 2013-02-01 NOTE — Telephone Encounter (Signed)
Per staff message and POF I have scheduled appts.  JMW  

## 2013-02-01 NOTE — Patient Instructions (Signed)
[facility] Discharge Instructions for Patients Receiving Chemotherapy  Today you received the following chemotherapy agents Gemzar/Abraxane.  To help prevent nausea and vomiting after your treatment, we encourage you to take your nausea medication as prescribed.   If you develop nausea and vomiting that is not controlled by your nausea medication, call the clinic.   BELOW ARE SYMPTOMS THAT SHOULD BE REPORTED IMMEDIATELY:  *FEVER GREATER THAN 100.5 F  *CHILLS WITH OR WITHOUT FEVER  NAUSEA AND VOMITING THAT IS NOT CONTROLLED WITH YOUR NAUSEA MEDICATION  *UNUSUAL SHORTNESS OF BREATH  *UNUSUAL BRUISING OR BLEEDING  TENDERNESS IN MOUTH AND THROAT WITH OR WITHOUT PRESENCE OF ULCERS  *URINARY PROBLEMS  *BOWEL PROBLEMS  UNUSUAL RASH Items with * indicate a potential emergency and should be followed up as soon as possible.  Feel free to call the clinic you have any questions or concerns. The clinic phone number is [phone].    

## 2013-02-08 ENCOUNTER — Ambulatory Visit (HOSPITAL_BASED_OUTPATIENT_CLINIC_OR_DEPARTMENT_OTHER): Payer: Medicare Other | Admitting: Lab

## 2013-02-08 ENCOUNTER — Ambulatory Visit (HOSPITAL_BASED_OUTPATIENT_CLINIC_OR_DEPARTMENT_OTHER): Payer: Medicare Other

## 2013-02-08 VITALS — BP 112/66 | HR 67 | Temp 98.0°F | Resp 20

## 2013-02-08 DIAGNOSIS — C259 Malignant neoplasm of pancreas, unspecified: Secondary | ICD-10-CM

## 2013-02-08 DIAGNOSIS — C787 Secondary malignant neoplasm of liver and intrahepatic bile duct: Secondary | ICD-10-CM

## 2013-02-08 DIAGNOSIS — C252 Malignant neoplasm of tail of pancreas: Secondary | ICD-10-CM

## 2013-02-08 DIAGNOSIS — Z5111 Encounter for antineoplastic chemotherapy: Secondary | ICD-10-CM

## 2013-02-08 LAB — CBC WITH DIFFERENTIAL/PLATELET
Basophils Absolute: 0 10*3/uL (ref 0.0–0.1)
Eosinophils Absolute: 0 10*3/uL (ref 0.0–0.5)
HGB: 10.4 g/dL — ABNORMAL LOW (ref 13.0–17.1)
LYMPH%: 40.6 % (ref 14.0–49.0)
MCV: 88.5 fL (ref 79.3–98.0)
MONO%: 5.6 % (ref 0.0–14.0)
NEUT#: 1.3 10*3/uL — ABNORMAL LOW (ref 1.5–6.5)
Platelets: 166 10*3/uL (ref 140–400)
RBC: 3.56 10*6/uL — ABNORMAL LOW (ref 4.20–5.82)

## 2013-02-08 MED ORDER — HEPARIN SOD (PORK) LOCK FLUSH 100 UNIT/ML IV SOLN
500.0000 [IU] | Freq: Once | INTRAVENOUS | Status: AC | PRN
  Administered 2013-02-08: 500 [IU]
  Filled 2013-02-08: qty 5

## 2013-02-08 MED ORDER — SODIUM CHLORIDE 0.9 % IJ SOLN
10.0000 mL | INTRAMUSCULAR | Status: DC | PRN
  Administered 2013-02-08: 10 mL
  Filled 2013-02-08: qty 10

## 2013-02-08 MED ORDER — ONDANSETRON 8 MG/50ML IVPB (CHCC)
8.0000 mg | Freq: Once | INTRAVENOUS | Status: AC
  Administered 2013-02-08: 8 mg via INTRAVENOUS

## 2013-02-08 MED ORDER — SODIUM CHLORIDE 0.9 % IV SOLN
560.0000 mg/m2 | Freq: Once | INTRAVENOUS | Status: AC
  Administered 2013-02-08: 1254 mg via INTRAVENOUS
  Filled 2013-02-08: qty 32.98

## 2013-02-08 MED ORDER — PACLITAXEL PROTEIN-BOUND CHEMO INJECTION 100 MG
80.0000 mg/m2 | Freq: Once | INTRAVENOUS | Status: AC
  Administered 2013-02-08: 175 mg via INTRAVENOUS
  Filled 2013-02-08: qty 35

## 2013-02-08 MED ORDER — SODIUM CHLORIDE 0.9 % IV SOLN
Freq: Once | INTRAVENOUS | Status: AC
  Administered 2013-02-08: 11:00:00 via INTRAVENOUS

## 2013-02-08 MED ORDER — DEXAMETHASONE SODIUM PHOSPHATE 10 MG/ML IJ SOLN
5.0000 mg | Freq: Once | INTRAMUSCULAR | Status: AC
  Administered 2013-02-08: 5 mg via INTRAVENOUS

## 2013-02-08 NOTE — Progress Notes (Signed)
Per Dr. Truett Perna, its OK to treat despite counts today.

## 2013-02-08 NOTE — Patient Instructions (Addendum)
[facility] Discharge Instructions for Patients Receiving Chemotherapy  Today you received the following chemotherapy agents: Abraxane and Gemzar   To help prevent nausea and vomiting after your treatment, we encourage you to take your nausea medication as prescribed.    If you develop nausea and vomiting that is not controlled by your nausea medication, call the clinic.   BELOW ARE SYMPTOMS THAT SHOULD BE REPORTED IMMEDIATELY:  *FEVER GREATER THAN 100.5 F  *CHILLS WITH OR WITHOUT FEVER  NAUSEA AND VOMITING THAT IS NOT CONTROLLED WITH YOUR NAUSEA MEDICATION  *UNUSUAL SHORTNESS OF BREATH  *UNUSUAL BRUISING OR BLEEDING  TENDERNESS IN MOUTH AND THROAT WITH OR WITHOUT PRESENCE OF ULCERS  *URINARY PROBLEMS  *BOWEL PROBLEMS  UNUSUAL RASH Items with * indicate a potential emergency and should be followed up as soon as possible.  Feel free to call the clinic you have any questions or concerns. The clinic phone number is [phone].    

## 2013-02-19 ENCOUNTER — Other Ambulatory Visit: Payer: Self-pay | Admitting: Oncology

## 2013-02-22 ENCOUNTER — Other Ambulatory Visit (HOSPITAL_BASED_OUTPATIENT_CLINIC_OR_DEPARTMENT_OTHER): Payer: Medicare Other | Admitting: Lab

## 2013-02-22 ENCOUNTER — Ambulatory Visit (HOSPITAL_BASED_OUTPATIENT_CLINIC_OR_DEPARTMENT_OTHER): Payer: Medicare Other

## 2013-02-22 ENCOUNTER — Ambulatory Visit (HOSPITAL_BASED_OUTPATIENT_CLINIC_OR_DEPARTMENT_OTHER): Payer: Medicare Other | Admitting: Oncology

## 2013-02-22 ENCOUNTER — Telehealth: Payer: Self-pay | Admitting: *Deleted

## 2013-02-22 ENCOUNTER — Telehealth: Payer: Self-pay | Admitting: Oncology

## 2013-02-22 VITALS — BP 123/64 | HR 77 | Temp 97.5°F | Ht 72.0 in | Wt 214.9 lb

## 2013-02-22 DIAGNOSIS — C787 Secondary malignant neoplasm of liver and intrahepatic bile duct: Secondary | ICD-10-CM

## 2013-02-22 DIAGNOSIS — Z5111 Encounter for antineoplastic chemotherapy: Secondary | ICD-10-CM

## 2013-02-22 DIAGNOSIS — E119 Type 2 diabetes mellitus without complications: Secondary | ICD-10-CM

## 2013-02-22 DIAGNOSIS — C259 Malignant neoplasm of pancreas, unspecified: Secondary | ICD-10-CM

## 2013-02-22 DIAGNOSIS — C252 Malignant neoplasm of tail of pancreas: Secondary | ICD-10-CM

## 2013-02-22 DIAGNOSIS — R51 Headache: Secondary | ICD-10-CM

## 2013-02-22 DIAGNOSIS — R109 Unspecified abdominal pain: Secondary | ICD-10-CM

## 2013-02-22 LAB — CBC WITH DIFFERENTIAL/PLATELET
BASO%: 0.4 % (ref 0.0–2.0)
LYMPH%: 23.7 % (ref 14.0–49.0)
MCH: 29.1 pg (ref 27.2–33.4)
MCHC: 32.6 g/dL (ref 32.0–36.0)
MCV: 89.2 fL (ref 79.3–98.0)
MONO%: 7 % (ref 0.0–14.0)
Platelets: 153 10*3/uL (ref 140–400)
RBC: 3.44 10*6/uL — ABNORMAL LOW (ref 4.20–5.82)
nRBC: 0 % (ref 0–0)

## 2013-02-22 MED ORDER — SODIUM CHLORIDE 0.9 % IV SOLN
560.0000 mg/m2 | Freq: Once | INTRAVENOUS | Status: AC
  Administered 2013-02-22: 1254 mg via INTRAVENOUS
  Filled 2013-02-22: qty 32.98

## 2013-02-22 MED ORDER — SODIUM CHLORIDE 0.9 % IV SOLN
Freq: Once | INTRAVENOUS | Status: AC
  Administered 2013-02-22: 11:00:00 via INTRAVENOUS

## 2013-02-22 MED ORDER — DEXAMETHASONE SODIUM PHOSPHATE 10 MG/ML IJ SOLN
INTRAMUSCULAR | Status: AC
  Filled 2013-02-22: qty 1

## 2013-02-22 MED ORDER — DEXAMETHASONE SODIUM PHOSPHATE 10 MG/ML IJ SOLN
5.0000 mg | Freq: Once | INTRAMUSCULAR | Status: AC
  Administered 2013-02-22: 5 mg via INTRAVENOUS

## 2013-02-22 MED ORDER — SODIUM CHLORIDE 0.9 % IJ SOLN
10.0000 mL | INTRAMUSCULAR | Status: DC | PRN
  Administered 2013-02-22: 10 mL
  Filled 2013-02-22: qty 10

## 2013-02-22 MED ORDER — PACLITAXEL PROTEIN-BOUND CHEMO INJECTION 100 MG
80.0000 mg/m2 | Freq: Once | INTRAVENOUS | Status: AC
  Administered 2013-02-22: 175 mg via INTRAVENOUS
  Filled 2013-02-22: qty 35

## 2013-02-22 MED ORDER — HEPARIN SOD (PORK) LOCK FLUSH 100 UNIT/ML IV SOLN
500.0000 [IU] | Freq: Once | INTRAVENOUS | Status: AC | PRN
  Administered 2013-02-22: 500 [IU]
  Filled 2013-02-22: qty 5

## 2013-02-22 MED ORDER — ONDANSETRON 8 MG/NS 50 ML IVPB
INTRAVENOUS | Status: AC
  Filled 2013-02-22: qty 8

## 2013-02-22 MED ORDER — ONDANSETRON 8 MG/50ML IVPB (CHCC)
8.0000 mg | Freq: Once | INTRAVENOUS | Status: AC
  Administered 2013-02-22: 8 mg via INTRAVENOUS

## 2013-02-22 NOTE — Telephone Encounter (Signed)
Per staff message and POF I have scheduled appts. Advised scheduler that lab appt needs to be moved  JMW  

## 2013-02-22 NOTE — Progress Notes (Signed)
   [facility]    OFFICE PROGRESS NOTE   INTERVAL HISTORY:   He returns as scheduled. He continues gemcitabine/Abraxane. No nausea, fever, or neuropathy symptoms. He reports occasional pain in the left lateral abdomen. The pain occurs a few days per week and resolves within minutes. He is not taking pain medication.  Objective:  Vital signs in last 24 hours:  Height 6' (1.829 m), weight 214 lb 14.4 oz (97.478 kg).    HEENT: No thrush or ulcers Resp: Lungs clear bilaterally Cardio: Regular rate and rhythm GI: No hepatosplenomegaly, nontender, no mass Vascular: No leg edema Neuro: Mild decrease in vibratory sense at the fingertips bilaterally    Portacath/PICC-without erythema  Lab Results:  Lab Results  Component Value Date   WBC 4.7 02/22/2013   HGB 10.0* 02/22/2013   HCT 30.7* 02/22/2013   MCV 89.2 02/22/2013   PLT 153 02/22/2013   ANC 3.2    Medications: I have reviewed the patient's current medications.  Assessment/Plan: 1. Adenocarcinoma of the pancreas status post ultrasound-guided biopsy of a liver lesion 10/18/2012 consistent with metastatic pancreas cancer. CT 10/11/2012 revealed a pancreas tail mass and liver metastases. CA 19-9 in normal range on 10/24/2012. Initiation of gemcitabine/Abraxane on a day 1, day 8 day 15 schedule on 10/28/2012. Current schedule is day one/day 8. He has completed 4 cycles. -restaging CT 01/26/2013 revealed a decrease in the size of liver metastases and no new lesions  2. Abdominal pain secondary to the pancreas mass. Improved. 3. Anorexia/weight loss. Improved. 4. Diabetes. Exacerbated by steroid premedication. Decadron was reduced to 5 mg after cycle 1 day 1. 5. History of coronary artery disease. 6. Headache following cycle 1 day 1 gemcitabine/Abraxane. Question related to Zofran.        7.   History of Thrombocytopenia secondary to chemotherapy.        8.   mild loss of vibratory sense in the fingertips-? Secondary  to Abraxane versus diabetes   Disposition:  He appears to be tolerating the chemotherapy well. No clinical evidence for progression of the pancreas cancer. The plan is to continue gemcitabine/Abraxane on a day one/day 8 schedule. He will return for an office visit on 03/15/2013.   Thornton Papas, MD  02/22/2013  10:02 AM

## 2013-02-22 NOTE — Telephone Encounter (Signed)
Talked to pt and he is aware of his appt for September 2014

## 2013-02-22 NOTE — Telephone Encounter (Signed)
Gave pt appt for lab , ML ands MD emailed Marcelino Duster regarding chemo on 9/17 and October

## 2013-02-22 NOTE — Patient Instructions (Addendum)
[facility] Discharge Instructions for Patients Receiving Chemotherapy  Today you received the following chemotherapy agents Abraxane and Gemzar.  To help prevent nausea and vomiting after your treatment, we encourage you to take your nausea medication.   If you develop nausea and vomiting that is not controlled by your nausea medication, call the clinic.   BELOW ARE SYMPTOMS THAT SHOULD BE REPORTED IMMEDIATELY:  *FEVER GREATER THAN 100.5 F  *CHILLS WITH OR WITHOUT FEVER  NAUSEA AND VOMITING THAT IS NOT CONTROLLED WITH YOUR NAUSEA MEDICATION  *UNUSUAL SHORTNESS OF BREATH  *UNUSUAL BRUISING OR BLEEDING  TENDERNESS IN MOUTH AND THROAT WITH OR WITHOUT PRESENCE OF ULCERS  *URINARY PROBLEMS  *BOWEL PROBLEMS  UNUSUAL RASH Items with * indicate a potential emergency and should be followed up as soon as possible.  Feel free to call the clinic you have any questions or concerns. The clinic phone number is [phone].    

## 2013-03-01 ENCOUNTER — Ambulatory Visit (HOSPITAL_BASED_OUTPATIENT_CLINIC_OR_DEPARTMENT_OTHER): Payer: Medicare Other

## 2013-03-01 ENCOUNTER — Other Ambulatory Visit (HOSPITAL_BASED_OUTPATIENT_CLINIC_OR_DEPARTMENT_OTHER): Payer: Medicare Other | Admitting: Lab

## 2013-03-01 VITALS — BP 122/63 | HR 62 | Temp 97.7°F | Resp 18

## 2013-03-01 DIAGNOSIS — C259 Malignant neoplasm of pancreas, unspecified: Secondary | ICD-10-CM

## 2013-03-01 DIAGNOSIS — C252 Malignant neoplasm of tail of pancreas: Secondary | ICD-10-CM

## 2013-03-01 DIAGNOSIS — Z5111 Encounter for antineoplastic chemotherapy: Secondary | ICD-10-CM

## 2013-03-01 LAB — CBC WITH DIFFERENTIAL/PLATELET
BASO%: 0.6 % (ref 0.0–2.0)
EOS%: 1.5 % (ref 0.0–7.0)
MCH: 29.7 pg (ref 27.2–33.4)
MCHC: 33.3 g/dL (ref 32.0–36.0)
MONO%: 9.3 % (ref 0.0–14.0)
NEUT%: 57.6 % (ref 39.0–75.0)
RDW: 15.8 % — ABNORMAL HIGH (ref 11.0–14.6)
lymph#: 1 10*3/uL (ref 0.9–3.3)

## 2013-03-01 MED ORDER — DEXAMETHASONE SODIUM PHOSPHATE 10 MG/ML IJ SOLN
INTRAMUSCULAR | Status: AC
  Filled 2013-03-01: qty 1

## 2013-03-01 MED ORDER — DEXAMETHASONE SODIUM PHOSPHATE 10 MG/ML IJ SOLN
5.0000 mg | Freq: Once | INTRAMUSCULAR | Status: AC
  Administered 2013-03-01: 5 mg via INTRAVENOUS

## 2013-03-01 MED ORDER — SODIUM CHLORIDE 0.9 % IJ SOLN
10.0000 mL | INTRAMUSCULAR | Status: DC | PRN
  Administered 2013-03-01: 10 mL
  Filled 2013-03-01: qty 10

## 2013-03-01 MED ORDER — ONDANSETRON 8 MG/50ML IVPB (CHCC)
8.0000 mg | Freq: Once | INTRAVENOUS | Status: AC
  Administered 2013-03-01: 8 mg via INTRAVENOUS

## 2013-03-01 MED ORDER — SODIUM CHLORIDE 0.9 % IV SOLN
560.0000 mg/m2 | Freq: Once | INTRAVENOUS | Status: AC
  Administered 2013-03-01: 1254 mg via INTRAVENOUS
  Filled 2013-03-01: qty 32.98

## 2013-03-01 MED ORDER — SODIUM CHLORIDE 0.9 % IV SOLN
Freq: Once | INTRAVENOUS | Status: AC
  Administered 2013-03-01: 15:00:00 via INTRAVENOUS

## 2013-03-01 MED ORDER — PACLITAXEL PROTEIN-BOUND CHEMO INJECTION 100 MG
80.0000 mg/m2 | Freq: Once | INTRAVENOUS | Status: AC
  Administered 2013-03-01: 175 mg via INTRAVENOUS
  Filled 2013-03-01: qty 35

## 2013-03-01 MED ORDER — ONDANSETRON 8 MG/NS 50 ML IVPB
INTRAVENOUS | Status: AC
  Filled 2013-03-01: qty 8

## 2013-03-01 MED ORDER — HEPARIN SOD (PORK) LOCK FLUSH 100 UNIT/ML IV SOLN
500.0000 [IU] | Freq: Once | INTRAVENOUS | Status: AC | PRN
  Administered 2013-03-01: 500 [IU]
  Filled 2013-03-01: qty 5

## 2013-03-01 NOTE — Patient Instructions (Addendum)
[facility] Discharge Instructions for Patients Receiving Chemotherapy  Today you received the following chemotherapy agents abraxane, Gemzar To help prevent nausea and vomiting after your treatment, we encourage you to take your nausea medication -Compazine every 6 hours as needed for nausea.  If you develop nausea and vomiting that is not controlled by your nausea medication, call the clinic.   BELOW ARE SYMPTOMS THAT SHOULD BE REPORTED IMMEDIATELY:  *FEVER GREATER THAN 100.5 F  *CHILLS WITH OR WITHOUT FEVER  NAUSEA AND VOMITING THAT IS NOT CONTROLLED WITH YOUR NAUSEA MEDICATION  *UNUSUAL SHORTNESS OF BREATH  *UNUSUAL BRUISING OR BLEEDING  TENDERNESS IN MOUTH AND THROAT WITH OR WITHOUT PRESENCE OF ULCERS  *URINARY PROBLEMS  *BOWEL PROBLEMS  UNUSUAL RASH Items with * indicate a potential emergency and should be followed up as soon as possible.  Feel free to call the clinic you have any questions or concerns. The clinic phone number is [phone].

## 2013-03-02 ENCOUNTER — Other Ambulatory Visit: Payer: Self-pay | Admitting: Certified Registered Nurse Anesthetist

## 2013-03-02 ENCOUNTER — Encounter: Payer: Self-pay | Admitting: Oncology

## 2013-03-02 NOTE — Progress Notes (Signed)
Will send note to billing to make sure dates of services 01/18/13 01/11/13 were sent to [employer] [facility]. They are in self pay now.

## 2013-03-12 ENCOUNTER — Other Ambulatory Visit: Payer: Self-pay | Admitting: Oncology

## 2013-03-15 ENCOUNTER — Ambulatory Visit (HOSPITAL_BASED_OUTPATIENT_CLINIC_OR_DEPARTMENT_OTHER): Payer: Medicare Other | Admitting: Nurse Practitioner

## 2013-03-15 ENCOUNTER — Ambulatory Visit (HOSPITAL_BASED_OUTPATIENT_CLINIC_OR_DEPARTMENT_OTHER): Payer: Medicare Other

## 2013-03-15 ENCOUNTER — Telehealth: Payer: Self-pay | Admitting: *Deleted

## 2013-03-15 ENCOUNTER — Encounter: Payer: Self-pay | Admitting: Oncology

## 2013-03-15 ENCOUNTER — Other Ambulatory Visit (HOSPITAL_BASED_OUTPATIENT_CLINIC_OR_DEPARTMENT_OTHER): Payer: Medicare Other | Admitting: Lab

## 2013-03-15 ENCOUNTER — Telehealth: Payer: Self-pay | Admitting: Oncology

## 2013-03-15 VITALS — BP 115/67 | HR 71 | Temp 97.6°F | Resp 19 | Ht 72.0 in | Wt 216.4 lb

## 2013-03-15 DIAGNOSIS — C252 Malignant neoplasm of tail of pancreas: Secondary | ICD-10-CM

## 2013-03-15 DIAGNOSIS — C259 Malignant neoplasm of pancreas, unspecified: Secondary | ICD-10-CM

## 2013-03-15 DIAGNOSIS — Z5111 Encounter for antineoplastic chemotherapy: Secondary | ICD-10-CM

## 2013-03-15 DIAGNOSIS — C787 Secondary malignant neoplasm of liver and intrahepatic bile duct: Secondary | ICD-10-CM

## 2013-03-15 DIAGNOSIS — R51 Headache: Secondary | ICD-10-CM

## 2013-03-15 DIAGNOSIS — R109 Unspecified abdominal pain: Secondary | ICD-10-CM

## 2013-03-15 DIAGNOSIS — R079 Chest pain, unspecified: Secondary | ICD-10-CM

## 2013-03-15 DIAGNOSIS — E119 Type 2 diabetes mellitus without complications: Secondary | ICD-10-CM

## 2013-03-15 LAB — CBC WITH DIFFERENTIAL/PLATELET
Basophils Absolute: 0 10*3/uL (ref 0.0–0.1)
EOS%: 2.9 % (ref 0.0–7.0)
HGB: 10.4 g/dL — ABNORMAL LOW (ref 13.0–17.1)
MCH: 29.6 pg (ref 27.2–33.4)
NEUT#: 2.8 10*3/uL (ref 1.5–6.5)
RBC: 3.51 10*6/uL — ABNORMAL LOW (ref 4.20–5.82)
RDW: 16.2 % — ABNORMAL HIGH (ref 11.0–14.6)
lymph#: 1.1 10*3/uL (ref 0.9–3.3)
nRBC: 0 % (ref 0–0)

## 2013-03-15 LAB — COMPREHENSIVE METABOLIC PANEL (CC13)
ALT: 24 U/L (ref 0–55)
AST: 26 U/L (ref 5–34)
Albumin: 3.7 g/dL (ref 3.5–5.0)
Alkaline Phosphatase: 46 U/L (ref 40–150)
Glucose: 161 mg/dl — ABNORMAL HIGH (ref 70–140)
Potassium: 4.6 mEq/L (ref 3.5–5.1)
Sodium: 140 mEq/L (ref 136–145)
Total Bilirubin: 0.45 mg/dL (ref 0.20–1.20)
Total Protein: 6.8 g/dL (ref 6.4–8.3)

## 2013-03-15 MED ORDER — SODIUM CHLORIDE 0.9 % IJ SOLN
10.0000 mL | INTRAMUSCULAR | Status: DC | PRN
  Administered 2013-03-15: 10 mL
  Filled 2013-03-15: qty 10

## 2013-03-15 MED ORDER — DEXAMETHASONE SODIUM PHOSPHATE 10 MG/ML IJ SOLN
INTRAMUSCULAR | Status: AC
  Filled 2013-03-15: qty 1

## 2013-03-15 MED ORDER — ONDANSETRON 8 MG/50ML IVPB (CHCC)
8.0000 mg | Freq: Once | INTRAVENOUS | Status: AC
  Administered 2013-03-15: 8 mg via INTRAVENOUS

## 2013-03-15 MED ORDER — SODIUM CHLORIDE 0.9 % IV SOLN
Freq: Once | INTRAVENOUS | Status: AC
  Administered 2013-03-15: 11:00:00 via INTRAVENOUS

## 2013-03-15 MED ORDER — PACLITAXEL PROTEIN-BOUND CHEMO INJECTION 100 MG
80.0000 mg/m2 | Freq: Once | INTRAVENOUS | Status: AC
  Administered 2013-03-15: 175 mg via INTRAVENOUS
  Filled 2013-03-15: qty 35

## 2013-03-15 MED ORDER — DEXAMETHASONE SODIUM PHOSPHATE 10 MG/ML IJ SOLN
5.0000 mg | Freq: Once | INTRAMUSCULAR | Status: AC
  Administered 2013-03-15: 5 mg via INTRAVENOUS

## 2013-03-15 MED ORDER — SODIUM CHLORIDE 0.9 % IV SOLN
560.0000 mg/m2 | Freq: Once | INTRAVENOUS | Status: AC
  Administered 2013-03-15: 1254 mg via INTRAVENOUS
  Filled 2013-03-15: qty 32.98

## 2013-03-15 MED ORDER — ONDANSETRON 8 MG/NS 50 ML IVPB
INTRAVENOUS | Status: AC
  Filled 2013-03-15: qty 8

## 2013-03-15 MED ORDER — HEPARIN SOD (PORK) LOCK FLUSH 100 UNIT/ML IV SOLN
500.0000 [IU] | Freq: Once | INTRAVENOUS | Status: AC | PRN
  Administered 2013-03-15: 500 [IU]
  Filled 2013-03-15: qty 5

## 2013-03-15 NOTE — Progress Notes (Signed)
I called and left the patient a message at the ph on file. [phone] is his correct balance. I sent to billing for clarification. I advised him if questions to call billing.

## 2013-03-15 NOTE — Patient Instructions (Signed)
[facility] Discharge Instructions for Patients Receiving Chemotherapy  Today you received the following chemotherapy agents Gemzar/Abraxane.  To help prevent nausea and vomiting after your treatment, we encourage you to take your nausea medication as prescribed.   If you develop nausea and vomiting that is not controlled by your nausea medication, call the clinic.   BELOW ARE SYMPTOMS THAT SHOULD BE REPORTED IMMEDIATELY:  *FEVER GREATER THAN 100.5 F  *CHILLS WITH OR WITHOUT FEVER  NAUSEA AND VOMITING THAT IS NOT CONTROLLED WITH YOUR NAUSEA MEDICATION  *UNUSUAL SHORTNESS OF BREATH  *UNUSUAL BRUISING OR BLEEDING  TENDERNESS IN MOUTH AND THROAT WITH OR WITHOUT PRESENCE OF ULCERS  *URINARY PROBLEMS  *BOWEL PROBLEMS  UNUSUAL RASH Items with * indicate a potential emergency and should be followed up as soon as possible.  Feel free to call the clinic you have any questions or concerns. The clinic phone number is [phone].    

## 2013-03-15 NOTE — Telephone Encounter (Signed)
Gave pt appt for lab and MD for October, emailed [facility] regarding chemo

## 2013-03-15 NOTE — Telephone Encounter (Signed)
Per staff message and POF I have scheduled appts.  JMW  

## 2013-03-15 NOTE — Progress Notes (Signed)
OFFICE PROGRESS NOTE  Interval history:  Christian Kaiser is a 78 year old man with metastatic pancreas cancer. He is being treated with gemcitabine/Abraxane on a day 1, day 8 schedule. He is seen today for scheduled followup. He denies any nausea, vomiting, mouth sores, diarrhea. No neuropathy symptoms. No skin rash. He has mild stable dyspnea on exertion. He has recently noted "phlegm" in the back of his throat and periodic hoarseness. No cough or fever. He also notes that his "nose runs" at dinnertime. He has intermittent nosebleeds. He denies abdominal pain.   Last week he had an episode of sudden onset pain left chest and shoulder radiating into the left arm. The pain lasted "a few minutes". He did not take nitroglycerin. There were no associated symptoms such as nausea, sweating, shortness of breath. He denies leg swelling and calf pain. He reports having 3 heart attacks in the past. With the first heart attack he became diaphoretic. With the second heart attack he had chest pain.   Objective: Blood pressure 115/67, pulse 71, temperature 97.6 F (36.4 C), temperature source Oral, resp. rate 19, height 6' (1.829 m), weight 216 lb 6.4 oz (98.158 kg).  Oropharynx is without thrush or ulceration. Lungs are clear. No wheezes or rales. Regular cardiac rhythm. No murmur. No neck vein distention. Port-A-Cath site is without erythema. Abdomen is soft and nontender. No organomegaly. No mass. Extremities are without edema. Calves are nontender. Vibratory sense intact over the fingertips per tuning fork exam.  Lab Results: Lab Results  Component Value Date   WBC 4.5 03/15/2013   HGB 10.4* 03/15/2013   HCT 31.9* 03/15/2013   MCV 90.9 03/15/2013   PLT 149 03/15/2013    Chemistry:    Chemistry      Component Value Date/Time   NA 139 02/01/2013 0840   NA 135 11/28/2011 0515   K 4.5 02/01/2013 0840   K 4.0 11/28/2011 0515   CL 107 11/25/2012 0934   CL 101 11/28/2011 0515   CO2 22 02/01/2013 0840   CO2 22  11/28/2011 0515   BUN 12.2 02/01/2013 0840   BUN 26* 11/28/2011 0515   CREATININE 1.3 02/01/2013 0840   CREATININE 1.29 11/28/2011 0515      Component Value Date/Time   CALCIUM 8.8 02/01/2013 0840   CALCIUM 9.2 11/28/2011 0515   ALKPHOS 46 02/01/2013 0840   ALKPHOS 43 11/27/2011 2229   AST 18 02/01/2013 0840   AST 14 11/27/2011 2229   ALT 24 02/01/2013 0840   ALT 13 11/27/2011 2229   BILITOT 0.42 02/01/2013 0840   BILITOT 0.2* 11/27/2011 2229       Studies/Results: No results found.  Medications: I have reviewed the patient's current medications.  Assessment/Plan:  1. Adenocarcinoma of the pancreas status post ultrasound-guided biopsy of a liver lesion 10/18/2012 consistent with metastatic pancreas cancer. CT 10/11/2012 revealed a pancreas tail mass and liver metastases. CA 19-9 in normal range on 10/24/2012. Initiation of gemcitabine/Abraxane on a day 1, day 8 day 15 schedule on 10/28/2012. Current schedule is day one/day 8.   Restaging CT 01/26/2013 revealed a decrease in the size of liver metastases and no new lesions   Gemcitabine/Abraxane continued on a day 1, day 8 schedule. 2. Abdominal pain secondary to the pancreas mass. Improved. 3. Anorexia/weight loss. Improved. 4. Diabetes. Exacerbated by steroid premedication. Decadron was reduced to 5 mg after cycle 1 day 1. 5. History of coronary artery disease. 6. Headache following cycle 1 day 1 gemcitabine/Abraxane. Question related to Zofran.  7. History of thrombocytopenia secondary to chemotherapy. 8. Mild loss of vibratory sense in the fingertips. Question secondary to Abraxane versus diabetes. Vibratory sense intact on 03/15/2013. 9. Recent episode of chest/left arm pain. He has a significant cardiac history with CAD/MI status post CABG.  Disposition-Christian Kaiser to tolerate the chemotherapy well. Plan to proceed with the next cycle today as scheduled.  I discussed the recent episode of chest/left arm pain with Dr.  Truett Perna. We recommended Christian Kaiser contact his cardiologist regarding the above and seek emergency evaluation with recurrent chest pain. He and his wife expressed their understanding.  Christian Kaiser is scheduled to return for a followup visit with Dr. Truett Perna in one week.   Lonna Cobb ANP/GNP-BC

## 2013-03-17 ENCOUNTER — Ambulatory Visit (INDEPENDENT_AMBULATORY_CARE_PROVIDER_SITE_OTHER): Payer: Medicare Other | Admitting: Nurse Practitioner

## 2013-03-17 ENCOUNTER — Telehealth: Payer: Self-pay | Admitting: Oncology

## 2013-03-17 ENCOUNTER — Encounter: Payer: Self-pay | Admitting: Nurse Practitioner

## 2013-03-17 VITALS — BP 110/80 | HR 71 | Ht 73.0 in | Wt 214.4 lb

## 2013-03-17 DIAGNOSIS — R079 Chest pain, unspecified: Secondary | ICD-10-CM

## 2013-03-17 MED ORDER — ISOSORBIDE MONONITRATE ER 60 MG PO TB24: 60.0000 mg | ORAL_TABLET | Freq: Every morning | ORAL | Status: DC

## 2013-03-17 NOTE — Progress Notes (Signed)
Christian Kaiser Date of Birth: 1934-09-30 Medical Record #[id number]  History of Present Illness: [name] is seen back today for a work in  visit. Seen for Dr. Katrinka Kaiser. Has known CAD with past anterior MI in 2002, treated with PCI, stenting for restenosis with subsequent CABG in 1990 with LIMA to LAD, SVG to RCA, SVG to OM, redo CABG in 2006 with SVG to OM and distal circumflex, SVG to PD. Other issues include DM, HTN, systolic and diastolic HF with past EF of 45 and then 50 to 55%, OSA, HLD, CKD, and carotid disease. Looks like his last cath was in 2007 and last echo looks to be in 2011.   Most recently diagnosed with pancreatic cancer - metastatic - seeing Dr. Truett Kaiser and receiving chemo with gemcitabine/Abraxane. Has not had progression based on last CT scan. Seen earlier this week for his oncology visit and reported some chest pain that he had had.   Comes in today. Here with his wife. He has been receiving chemo since May. Has tolerated ok. Some dizziness. Some lightheadedness. Noted over the past several weeks to have some discomfort in the left breast with radiation to the left arm - not sure if it is like his prior heart pain. Lasts for just a few minutes. Mostly occuring at rest. Does get short of breath with over activity. Not taking any NTG. Nothing he can do to bring it or or make it go away. Continues to have spells of diaphoresis but this is not new. Not very active at this time. BP is ok. Only on half dose of Imdur.   Says he has worn a heart monitor in the past - did not hear the results - not sure how ordered?  Current Outpatient Prescriptions  Medication Sig Dispense Refill  . Artificial Tear Solution (JUST TEARS EYE DROPS OP) Apply 1 drop to eye at bedtime.      Marland Kitchen aspirin EC 81 MG tablet Take 81 mg by mouth daily.      Marland Kitchen glimepiride (AMARYL) 4 MG tablet Take 4 mg by mouth 2 (two) times daily.      Marland Kitchen HYDROcodone-acetaminophen (NORCO/VICODIN) 5-325 MG per tablet Take 1  tablet by mouth every 4 (four) hours as needed for pain.  30 tablet  1  . insulin detemir (LEVEMIR) 100 UNIT/ML injection Inject 40 Units into the skin at bedtime.       . isosorbide mononitrate (IMDUR) 60 MG 24 hr tablet Take 30 mg by mouth every morning.      . lidocaine-prilocaine (EMLA) cream Apply topically as needed. apply over port 1-2 hours prior to chemotherapy.  30 g  0  . metFORMIN (GLUCOPHAGE) 1000 MG tablet Take 1,000 mg by mouth 2 (two) times daily with a meal.      . nitroGLYCERIN (NITROSTAT) 0.4 MG SL tablet Place 1 tablet (0.4 mg total) under the tongue every 5 (five) minutes as needed for chest pain.  60 tablet  0  . Omega-3 Fatty Acids (FISH OIL) 1200 MG CAPS Take 1 capsule by mouth 2 (two) times daily.       . prochlorperazine (COMPAZINE) 10 MG tablet Take 1 tablet (10 mg total) by mouth every 6 (six) hours as needed (nausea).  60 tablet  1  . simvastatin (ZOCOR) 80 MG tablet Take 80 mg by mouth at bedtime.       No current facility-administered medications for this visit.    No Known Allergies  Past  Medical History  Diagnosis Date  . Diabetes mellitus     type 2  . Kidney stones   . HTN (hypertension)   . HLD (hyperlipidemia)   . CAD (coronary artery disease)     s/p 4v CABG 1990s and redo CABG 2006; cath 2007 showed patent grafts  . Carotid disease, bilateral     06/2010 carotid doppler <40% RICA stenosis and 40-60% LICA stenosis  . Gastric ulcer 2003  . Diverticulitis   . Anal fissure     s/p repair  . Shingles   . Cervical spondylosis   . Sacroiliitis   . Pancreas cancer 10/2012    Past Surgical History  Procedure Laterality Date  . Coronary artery bypass graft      1990s (SVG OM2/OM3, SVG to RCA, LIMA to LAD) and redo 2006 (reverse SVT to OM & distal LCx, reverse SVG to PDA, preservation of LIMA)  . Rotator cuff repair      Left  . Knee arthroscopy      Right    History  Smoking status  . Former Smoker  . Quit date: 10/19/2002  Smokeless  tobacco  . Not on file    History  Alcohol Use No    Family History  Problem Relation Age of Onset  . Cancer Father     Review of Systems: The review of systems is per the HPI.  All other systems were reviewed and are negative.  Physical Exam: BP 110/80  Pulse 71  Ht 6\' 1"  (1.854 m)  Wt 214 lb 6.4 oz (97.251 kg)  BMI 28.29 kg/m2 Patient is very pleasant and in no acute distress. Weight is down 6 pounds since last visit from November of 2013. Skin is warm and dry. Color is normal.  HEENT is unremarkable. Normocephalic/atraumatic. PERRL. Sclera are nonicteric. Neck is supple. No masses. No JVD. Lungs are clear. Cardiac exam shows a regular rate and rhythm. Occasional ectopic noted.  Abdomen is soft. Extremities are without edema. Gait and ROM are intact. No gross neurologic deficits noted.  LABORATORY DATA: EKG today shows sinus rhythm, RSR' and PVC's.    Lab Results  Component Value Date   WBC 4.5 03/15/2013   HGB 10.4* 03/15/2013   HCT 31.9* 03/15/2013   PLT 149 03/15/2013   GLUCOSE 161* 03/15/2013   ALT 24 03/15/2013   AST 26 03/15/2013   NA 140 03/15/2013   K 4.6 03/15/2013   CL 107 11/25/2012   CREATININE 1.3 03/15/2013   BUN 13.7 03/15/2013   CO2 23 03/15/2013   INR 0.98 10/18/2012   HGBA1C 11.9* 11/27/2011     Assessment / Plan: 1. Chest pain - has known CAD with past CABG as well as redo CABG - I would favor a conservative approach given that he has metastatic pancreatic cancer - only on 1/2 dose of Imdur - will increase to a full tablet (60mg ). Has his regular follow up next month with Dr. Katrinka Kaiser. Patient was subsequently seen with Dr. Katrinka Kaiser who is in agreement with my plan.   2. Metastatic pancreatic cancer - receiving chemo under the care of Dr. Truett Kaiser.   3. HTN - BP  By me is 130/80. No change in therapy.   4. HLD - on statin therapy.  5. DM - no recent A1C - followed by primary care.   Patient is agreeable to this plan and will call if any problems develop in the  interim.   Christian Macadamia, RN, ANP-C [facility] [address] [place], [place]  [address]

## 2013-03-17 NOTE — Telephone Encounter (Signed)
Called pt and left message for lab, md and chemo for month ofn October 2014

## 2013-03-17 NOTE — Patient Instructions (Addendum)
We need to increase your Imdur to a full tablet each day.  Use your NTG under your tongue for recurrent chest pain. May take one tablet every 5 minutes. If you are still having discomfort after 3 tablets in 15 minutes, call 911.  See Dr. Katrinka Blazing next month as planned - here at this location.  Call the [facility] office at [phone] if you have any questions, problems or concerns.

## 2013-03-22 ENCOUNTER — Ambulatory Visit (HOSPITAL_BASED_OUTPATIENT_CLINIC_OR_DEPARTMENT_OTHER): Payer: Medicare Other | Admitting: Oncology

## 2013-03-22 ENCOUNTER — Other Ambulatory Visit (HOSPITAL_BASED_OUTPATIENT_CLINIC_OR_DEPARTMENT_OTHER): Payer: Medicare Other | Admitting: Lab

## 2013-03-22 ENCOUNTER — Telehealth: Payer: Self-pay | Admitting: Oncology

## 2013-03-22 ENCOUNTER — Ambulatory Visit (HOSPITAL_BASED_OUTPATIENT_CLINIC_OR_DEPARTMENT_OTHER): Payer: Medicare Other

## 2013-03-22 ENCOUNTER — Other Ambulatory Visit: Payer: Self-pay | Admitting: *Deleted

## 2013-03-22 VITALS — BP 144/79 | HR 80 | Temp 96.9°F | Resp 18 | Ht 73.0 in | Wt 215.4 lb

## 2013-03-22 DIAGNOSIS — C252 Malignant neoplasm of tail of pancreas: Secondary | ICD-10-CM

## 2013-03-22 DIAGNOSIS — C259 Malignant neoplasm of pancreas, unspecified: Secondary | ICD-10-CM

## 2013-03-22 DIAGNOSIS — E119 Type 2 diabetes mellitus without complications: Secondary | ICD-10-CM

## 2013-03-22 DIAGNOSIS — C787 Secondary malignant neoplasm of liver and intrahepatic bile duct: Secondary | ICD-10-CM

## 2013-03-22 DIAGNOSIS — R109 Unspecified abdominal pain: Secondary | ICD-10-CM

## 2013-03-22 DIAGNOSIS — Z5111 Encounter for antineoplastic chemotherapy: Secondary | ICD-10-CM

## 2013-03-22 DIAGNOSIS — D696 Thrombocytopenia, unspecified: Secondary | ICD-10-CM

## 2013-03-22 LAB — CBC WITH DIFFERENTIAL/PLATELET
BASO%: 0.6 % (ref 0.0–2.0)
Basophils Absolute: 0 10*3/uL (ref 0.0–0.1)
Eosinophils Absolute: 0 10*3/uL (ref 0.0–0.5)
LYMPH%: 22.2 % (ref 14.0–49.0)
MCHC: 32.7 g/dL (ref 32.0–36.0)
MCV: 90.9 fL (ref 79.3–98.0)
MONO%: 4.8 % (ref 0.0–14.0)
NEUT#: 2.3 10*3/uL (ref 1.5–6.5)
Platelets: 160 10*3/uL (ref 140–400)
RBC: 3.3 10*6/uL — ABNORMAL LOW (ref 4.20–5.82)
RDW: 15.6 % — ABNORMAL HIGH (ref 11.0–14.6)
WBC: 3.2 10*3/uL — ABNORMAL LOW (ref 4.0–10.3)

## 2013-03-22 MED ORDER — SODIUM CHLORIDE 0.9 % IV SOLN
560.0000 mg/m2 | Freq: Once | INTRAVENOUS | Status: AC
  Administered 2013-03-22: 1254 mg via INTRAVENOUS
  Filled 2013-03-22: qty 32.98

## 2013-03-22 MED ORDER — HEPARIN SOD (PORK) LOCK FLUSH 100 UNIT/ML IV SOLN
500.0000 [IU] | Freq: Once | INTRAVENOUS | Status: AC | PRN
  Administered 2013-03-22: 500 [IU]
  Filled 2013-03-22: qty 5

## 2013-03-22 MED ORDER — ONDANSETRON 8 MG/NS 50 ML IVPB
INTRAVENOUS | Status: AC
  Filled 2013-03-22: qty 8

## 2013-03-22 MED ORDER — SODIUM CHLORIDE 0.9 % IJ SOLN
10.0000 mL | INTRAMUSCULAR | Status: DC | PRN
  Administered 2013-03-22: 10 mL
  Filled 2013-03-22: qty 10

## 2013-03-22 MED ORDER — SODIUM CHLORIDE 0.9 % IV SOLN
Freq: Once | INTRAVENOUS | Status: AC
  Administered 2013-03-22: 11:00:00 via INTRAVENOUS

## 2013-03-22 MED ORDER — ONDANSETRON 8 MG/50ML IVPB (CHCC)
8.0000 mg | Freq: Once | INTRAVENOUS | Status: AC
  Administered 2013-03-22: 8 mg via INTRAVENOUS

## 2013-03-22 MED ORDER — DEXAMETHASONE SODIUM PHOSPHATE 10 MG/ML IJ SOLN
INTRAMUSCULAR | Status: AC
  Filled 2013-03-22: qty 1

## 2013-03-22 MED ORDER — DEXAMETHASONE SODIUM PHOSPHATE 10 MG/ML IJ SOLN
5.0000 mg | Freq: Once | INTRAMUSCULAR | Status: AC
  Administered 2013-03-22: 5 mg via INTRAVENOUS

## 2013-03-22 MED ORDER — PACLITAXEL PROTEIN-BOUND CHEMO INJECTION 100 MG
80.0000 mg/m2 | Freq: Once | INTRAVENOUS | Status: AC
  Administered 2013-03-22: 175 mg via INTRAVENOUS
  Filled 2013-03-22: qty 35

## 2013-03-22 NOTE — Patient Instructions (Signed)
[facility] Discharge Instructions for Patients Receiving Chemotherapy  Today you received the following chemotherapy agents: abraxane, gemzar   To help prevent nausea and vomiting after your treatment, we encourage you to take your nausea medication.  Take it as often as prescribed.     If you develop nausea and vomiting that is not controlled by your nausea medication, call the clinic. If it is after clinic hours your family physician or the after hours number for the clinic or go to the Emergency Department.   BELOW ARE SYMPTOMS THAT SHOULD BE REPORTED IMMEDIATELY:  *FEVER GREATER THAN 100.5 F  *CHILLS WITH OR WITHOUT FEVER  NAUSEA AND VOMITING THAT IS NOT CONTROLLED WITH YOUR NAUSEA MEDICATION  *UNUSUAL SHORTNESS OF BREATH  *UNUSUAL BRUISING OR BLEEDING  TENDERNESS IN MOUTH AND THROAT WITH OR WITHOUT PRESENCE OF ULCERS  *URINARY PROBLEMS  *BOWEL PROBLEMS  UNUSUAL RASH Items with * indicate a potential emergency and should be followed up as soon as possible.  Feel free to call the clinic you have any questions or concerns. The clinic phone number is [phone].   I have been informed and understand all the instructions given to me. I know to contact the clinic, my physician, or go to the Emergency Department if any problems should occur. I do not have any questions at this time, but understand that I may call the clinic during office hours   should I have any questions or need assistance in obtaining follow up care.    __________________________________________  _____________  __________ Signature of Patient or Authorized Representative            Date                   Time    __________________________________________ Nurse's Signature    

## 2013-03-22 NOTE — Progress Notes (Signed)
   [facility]    OFFICE PROGRESS NOTE   INTERVAL HISTORY:   Christian Kaiser returns for scheduled followup of pancreas cancer. He completed another treatment with gemcitabine/Abraxane on 03/15/2013. He tolerated the chemotherapy well. No neuropathy symptoms. No pain. Good energy level. No further chest pain. He was evaluated by cardiology  Objective:  Vital signs in last 24 hours:  Blood pressure 144/79, pulse 80, temperature 96.9 F (36.1 C), temperature source Oral, resp. rate 18, height 6\' 1"  (1.854 m), weight 215 lb 6.4 oz (97.705 kg).    HEENT: No thrush or ulcers Resp: Lungs clear bilaterally Cardio: Regular rate and rhythm GI: No hepatomegaly, no mass, nontender Vascular: No leg edema Neuro: Mild to moderate decrease in vibratory sense at the fingertips    Portacath/PICC-without erythema  Lab Results:  Lab Results  Component Value Date   WBC 3.2* 03/22/2013   HGB 9.8* 03/22/2013   HCT 30.0* 03/22/2013   MCV 90.9 03/22/2013   PLT 160 03/22/2013   ANC 2.3    Medications: I have reviewed the patient's current medications.  Assessment/Plan: 1. Adenocarcinoma of the pancreas status post ultrasound-guided biopsy of a liver lesion 10/18/2012 consistent with metastatic pancreas cancer. CT 10/11/2012 revealed a pancreas tail mass and liver metastases. CA 19-9 in normal range on 10/24/2012. Initiation of gemcitabine/Abraxane on a day 1, day 8 day 15 schedule on 10/28/2012. Current schedule is day one/day 8.  Restaging CT 01/26/2013 revealed a decrease in the size of liver metastases and no new lesions  Gemcitabine/Abraxane continued on a day 1, day 8 schedule. 2. Abdominal pain secondary to the pancreas mass. Improved. 3. Anorexia/weight loss. Improved. 4. Diabetes. Exacerbated by steroid premedication. Decadron was reduced to 5 mg after cycle 1 day 1. 5. History of coronary artery disease. 6. Headache following cycle 1 day 1 gemcitabine/Abraxane. Question  related to Zofran.  7. History of thrombocytopenia secondary to chemotherapy. 8. Mild to moderate loss of vibratory sense in the fingertips. Question secondary to Abraxane versus diabetes. Not interfering with activity at present.  Disposition:  He appears stable. The plan is to continue gemcitabine/Abraxane on a day one/day 8 schedule. He will return for an office visit on 04/05/2013. He will be scheduled for a restaging CT evaluation on 04/25/2013. He will contact us if he develops neuropathy symptoms.   Thornton Papas, MD  03/22/2013  10:32 AM

## 2013-03-22 NOTE — Telephone Encounter (Signed)
gv pt appt schedule for October and November.  °

## 2013-03-30 ENCOUNTER — Other Ambulatory Visit: Payer: Self-pay | Admitting: *Deleted

## 2013-03-30 DIAGNOSIS — Z95828 Presence of other vascular implants and grafts: Secondary | ICD-10-CM

## 2013-03-30 MED ORDER — LIDOCAINE-PRILOCAINE 2.5-2.5 % EX CREA: TOPICAL_CREAM | CUTANEOUS | Status: DC | PRN

## 2013-04-02 ENCOUNTER — Other Ambulatory Visit: Payer: Self-pay | Admitting: Oncology

## 2013-04-05 ENCOUNTER — Telehealth: Payer: Self-pay | Admitting: *Deleted

## 2013-04-05 ENCOUNTER — Ambulatory Visit (HOSPITAL_BASED_OUTPATIENT_CLINIC_OR_DEPARTMENT_OTHER): Payer: Medicare Other | Admitting: Nurse Practitioner

## 2013-04-05 ENCOUNTER — Other Ambulatory Visit (HOSPITAL_BASED_OUTPATIENT_CLINIC_OR_DEPARTMENT_OTHER): Payer: Medicare Other | Admitting: Lab

## 2013-04-05 ENCOUNTER — Telehealth: Payer: Self-pay | Admitting: Oncology

## 2013-04-05 ENCOUNTER — Ambulatory Visit (HOSPITAL_BASED_OUTPATIENT_CLINIC_OR_DEPARTMENT_OTHER): Payer: Medicare Other

## 2013-04-05 VITALS — BP 140/76 | HR 67 | Temp 97.3°F | Resp 18 | Ht 73.0 in | Wt 233.0 lb

## 2013-04-05 DIAGNOSIS — E119 Type 2 diabetes mellitus without complications: Secondary | ICD-10-CM

## 2013-04-05 DIAGNOSIS — C787 Secondary malignant neoplasm of liver and intrahepatic bile duct: Secondary | ICD-10-CM

## 2013-04-05 DIAGNOSIS — C252 Malignant neoplasm of tail of pancreas: Secondary | ICD-10-CM

## 2013-04-05 DIAGNOSIS — C259 Malignant neoplasm of pancreas, unspecified: Secondary | ICD-10-CM

## 2013-04-05 DIAGNOSIS — Z5111 Encounter for antineoplastic chemotherapy: Secondary | ICD-10-CM

## 2013-04-05 LAB — CBC WITH DIFFERENTIAL/PLATELET
BASO%: 0.7 % (ref 0.0–2.0)
EOS%: 2.7 % (ref 0.0–7.0)
MCH: 29.3 pg (ref 27.2–33.4)
MCHC: 32.2 g/dL (ref 32.0–36.0)
RBC: 3.31 10*6/uL — ABNORMAL LOW (ref 4.20–5.82)
RDW: 16 % — ABNORMAL HIGH (ref 11.0–14.6)
lymph#: 1 10*3/uL (ref 0.9–3.3)

## 2013-04-05 MED ORDER — ONDANSETRON 8 MG/NS 50 ML IVPB
INTRAVENOUS | Status: AC
  Filled 2013-04-05: qty 8

## 2013-04-05 MED ORDER — SODIUM CHLORIDE 0.9 % IJ SOLN
10.0000 mL | INTRAMUSCULAR | Status: DC | PRN
  Administered 2013-04-05: 10 mL
  Filled 2013-04-05: qty 10

## 2013-04-05 MED ORDER — SODIUM CHLORIDE 0.9 % IV SOLN
560.0000 mg/m2 | Freq: Once | INTRAVENOUS | Status: AC
  Administered 2013-04-05: 1254 mg via INTRAVENOUS
  Filled 2013-04-05: qty 33

## 2013-04-05 MED ORDER — PACLITAXEL PROTEIN-BOUND CHEMO INJECTION 100 MG
80.0000 mg/m2 | Freq: Once | INTRAVENOUS | Status: AC
  Administered 2013-04-05: 175 mg via INTRAVENOUS
  Filled 2013-04-05: qty 35

## 2013-04-05 MED ORDER — SODIUM CHLORIDE 0.9 % IV SOLN
Freq: Once | INTRAVENOUS | Status: AC
  Administered 2013-04-05: 12:00:00 via INTRAVENOUS

## 2013-04-05 MED ORDER — HEPARIN SOD (PORK) LOCK FLUSH 100 UNIT/ML IV SOLN
500.0000 [IU] | Freq: Once | INTRAVENOUS | Status: AC | PRN
  Administered 2013-04-05: 500 [IU]
  Filled 2013-04-05: qty 5

## 2013-04-05 MED ORDER — DEXAMETHASONE SODIUM PHOSPHATE 10 MG/ML IJ SOLN
INTRAMUSCULAR | Status: AC
  Filled 2013-04-05: qty 1

## 2013-04-05 MED ORDER — ONDANSETRON 8 MG/50ML IVPB (CHCC)
8.0000 mg | Freq: Once | INTRAVENOUS | Status: AC
  Administered 2013-04-05: 8 mg via INTRAVENOUS

## 2013-04-05 MED ORDER — DEXAMETHASONE SODIUM PHOSPHATE 10 MG/ML IJ SOLN
5.0000 mg | Freq: Once | INTRAMUSCULAR | Status: AC
  Administered 2013-04-05: 5 mg via INTRAVENOUS

## 2013-04-05 NOTE — Patient Instructions (Signed)
[facility] Discharge Instructions for Patients Receiving Chemotherapy  Today you received the following chemotherapy agents: Abraxane, Gemzar   To help prevent nausea and vomiting after your treatment, we encourage you to take your nausea medication as prescribed.    If you develop nausea and vomiting that is not controlled by your nausea medication, call the clinic.   BELOW ARE SYMPTOMS THAT SHOULD BE REPORTED IMMEDIATELY:  *FEVER GREATER THAN 100.5 F  *CHILLS WITH OR WITHOUT FEVER  NAUSEA AND VOMITING THAT IS NOT CONTROLLED WITH YOUR NAUSEA MEDICATION  *UNUSUAL SHORTNESS OF BREATH  *UNUSUAL BRUISING OR BLEEDING  TENDERNESS IN MOUTH AND THROAT WITH OR WITHOUT PRESENCE OF ULCERS  *URINARY PROBLEMS  *BOWEL PROBLEMS  UNUSUAL RASH Items with * indicate a potential emergency and should be followed up as soon as possible.  Feel free to call the clinic you have any questions or concerns. The clinic phone number is [phone].    

## 2013-04-05 NOTE — Progress Notes (Signed)
OFFICE PROGRESS NOTE  Interval history:  Christian Kaiser is a 78 year old man with metastatic pancreas cancer currently being treated with gemcitabine/Abraxane on a day 1, day 8 schedule. He is seen today prior to beginning a new cycle.  He overall feels well. He denies nausea/vomiting. He notes the lower inner anterior gumline is tender. He denies any ulcerations. He wonders if the tenderness is related to his dentures. He has intermittent constipation. He takes Dulcolax as needed. No numbness or tingling in his hands or feet. No skin rash. He has occasional episodes of epistaxis. He is able to stop the bleeding within a few minutes. No other bleeding. No further chest pain. He denies shortness of breath. No abdominal pain.   Objective: Blood pressure 140/76, pulse 67, temperature 97.3 F (36.3 C), temperature source Oral, resp. rate 18, height 6\' 1"  (1.854 m), weight 233 lb (105.688 kg).  Oropharynx is without thrush or ulceration. Lungs are clear. No wheezes or rales. Regular cardiac rhythm. Port-A-Cath site is without erythema. Abdomen soft and nontender. No hepatomegaly. No mass. Extremities without edema. Calves soft and nontender. Mild to moderate decrease in vibratory sense over the fingertips per tuning fork exam.  Lab Results: Lab Results  Component Value Date   WBC 4.5 04/05/2013   HGB 9.7* 04/05/2013   HCT 30.1* 04/05/2013   MCV 90.9 04/05/2013   PLT 149 04/05/2013    Chemistry:    Chemistry      Component Value Date/Time   NA 140 03/15/2013 0900   NA 135 11/28/2011 0515   K 4.6 03/15/2013 0900   K 4.0 11/28/2011 0515   CL 107 11/25/2012 0934   CL 101 11/28/2011 0515   CO2 23 03/15/2013 0900   CO2 22 11/28/2011 0515   BUN 13.7 03/15/2013 0900   BUN 26* 11/28/2011 0515   CREATININE 1.3 03/15/2013 0900   CREATININE 1.29 11/28/2011 0515      Component Value Date/Time   CALCIUM 9.4 03/15/2013 0900   CALCIUM 9.2 11/28/2011 0515   ALKPHOS 46 03/15/2013 0900   ALKPHOS 43 11/27/2011 2229    AST 26 03/15/2013 0900   AST 14 11/27/2011 2229   ALT 24 03/15/2013 0900   ALT 13 11/27/2011 2229   BILITOT 0.45 03/15/2013 0900   BILITOT 0.2* 11/27/2011 2229       Studies/Results: No results found.  Medications: I have reviewed the patient's current medications.  Assessment/Plan:  1. Adenocarcinoma of the pancreas status post ultrasound-guided biopsy of a liver lesion 10/18/2012 consistent with metastatic pancreas cancer. CT 10/11/2012 revealed a pancreas tail mass and liver metastases. CA 19-9 in normal range on 10/24/2012. Initiation of gemcitabine/Abraxane on a day 1, day 8 day 15 schedule on 10/28/2012. Current schedule is day one/day 8.  Restaging CT 01/26/2013 revealed a decrease in the size of liver metastases and no new lesions  Gemcitabine/Abraxane continued on a day 1, day 8 schedule. 2. Abdominal pain secondary to the pancreas mass. Improved. 3. Anorexia/weight loss. Improved. 4. Diabetes. Exacerbated by steroid premedication. Decadron was reduced to 5 mg after cycle 1 day 1. 5. History of coronary artery disease. 6. Headache following cycle 1 day 1 gemcitabine/Abraxane. Question related to Zofran.  7. History of thrombocytopenia secondary to chemotherapy. 8. Mild to moderate loss of vibratory sense in the fingertips. Question secondary to Abraxane versus diabetes. Not interfering with activity at present.  Disposition-Christian Kaiser appears well. Plan to continue gemcitabine/Abraxane on a day one/day 8 schedule. Restaging CT evaluation is scheduled on 04/25/2013.  He will return for a followup visit on 04/26/2013. He will contact the office in the interim with any problems.  Lonna Cobb ANP/GNP-BC

## 2013-04-05 NOTE — Telephone Encounter (Signed)
gv and printed appt sched and avs for pt for OCT thru Dec...emailed MW to add tx.

## 2013-04-05 NOTE — Telephone Encounter (Signed)
Per staff message and POF I have scheduled appts.  JMW  

## 2013-04-12 ENCOUNTER — Ambulatory Visit (HOSPITAL_BASED_OUTPATIENT_CLINIC_OR_DEPARTMENT_OTHER): Payer: Medicare Other

## 2013-04-12 ENCOUNTER — Other Ambulatory Visit (HOSPITAL_BASED_OUTPATIENT_CLINIC_OR_DEPARTMENT_OTHER): Payer: Medicare Other | Admitting: Lab

## 2013-04-12 VITALS — BP 129/69 | HR 81 | Temp 97.7°F | Resp 18

## 2013-04-12 DIAGNOSIS — C252 Malignant neoplasm of tail of pancreas: Secondary | ICD-10-CM

## 2013-04-12 DIAGNOSIS — C259 Malignant neoplasm of pancreas, unspecified: Secondary | ICD-10-CM

## 2013-04-12 DIAGNOSIS — C787 Secondary malignant neoplasm of liver and intrahepatic bile duct: Secondary | ICD-10-CM

## 2013-04-12 DIAGNOSIS — Z5111 Encounter for antineoplastic chemotherapy: Secondary | ICD-10-CM

## 2013-04-12 LAB — CBC WITH DIFFERENTIAL/PLATELET
BASO%: 1.7 % (ref 0.0–2.0)
Basophils Absolute: 0 10*3/uL (ref 0.0–0.1)
EOS%: 0.9 % (ref 0.0–7.0)
HCT: 30.1 % — ABNORMAL LOW (ref 38.4–49.9)
HGB: 9.8 g/dL — ABNORMAL LOW (ref 13.0–17.1)
MCH: 29.3 pg (ref 27.2–33.4)
MCHC: 32.6 g/dL (ref 32.0–36.0)
MCV: 89.9 fL (ref 79.3–98.0)
MONO%: 8.6 % (ref 0.0–14.0)
NEUT%: 60.9 % (ref 39.0–75.0)

## 2013-04-12 MED ORDER — ONDANSETRON 8 MG/50ML IVPB (CHCC)
8.0000 mg | Freq: Once | INTRAVENOUS | Status: AC
  Administered 2013-04-12: 8 mg via INTRAVENOUS

## 2013-04-12 MED ORDER — SODIUM CHLORIDE 0.9 % IV SOLN
560.0000 mg/m2 | Freq: Once | INTRAVENOUS | Status: AC
  Administered 2013-04-12: 1254 mg via INTRAVENOUS
  Filled 2013-04-12: qty 32.98

## 2013-04-12 MED ORDER — PACLITAXEL PROTEIN-BOUND CHEMO INJECTION 100 MG
80.0000 mg/m2 | Freq: Once | INTRAVENOUS | Status: AC
  Administered 2013-04-12: 175 mg via INTRAVENOUS
  Filled 2013-04-12: qty 35

## 2013-04-12 MED ORDER — DEXAMETHASONE SODIUM PHOSPHATE 10 MG/ML IJ SOLN
5.0000 mg | Freq: Once | INTRAMUSCULAR | Status: AC
  Administered 2013-04-12: 5 mg via INTRAVENOUS

## 2013-04-12 MED ORDER — DEXAMETHASONE SODIUM PHOSPHATE 10 MG/ML IJ SOLN
INTRAMUSCULAR | Status: AC
  Filled 2013-04-12: qty 1

## 2013-04-12 MED ORDER — HEPARIN SOD (PORK) LOCK FLUSH 100 UNIT/ML IV SOLN
500.0000 [IU] | Freq: Once | INTRAVENOUS | Status: AC | PRN
  Administered 2013-04-12: 500 [IU]
  Filled 2013-04-12: qty 5

## 2013-04-12 MED ORDER — SODIUM CHLORIDE 0.9 % IV SOLN
Freq: Once | INTRAVENOUS | Status: AC
  Administered 2013-04-12: 10:00:00 via INTRAVENOUS

## 2013-04-12 MED ORDER — SODIUM CHLORIDE 0.9 % IJ SOLN
10.0000 mL | INTRAMUSCULAR | Status: DC | PRN
  Administered 2013-04-12: 10 mL
  Filled 2013-04-12: qty 10

## 2013-04-12 MED ORDER — ONDANSETRON 8 MG/NS 50 ML IVPB
INTRAVENOUS | Status: AC
  Filled 2013-04-12: qty 8

## 2013-04-12 NOTE — Progress Notes (Signed)
Pt states he has been doing fine, wife states he has been more tired. Was having pain in left lower leg over weekend when going up stairs, no redness or swelling at present time. Discussed with Dr Truett Perna. OK to treat with current CBC counts

## 2013-04-12 NOTE — Patient Instructions (Signed)
[facility] Discharge Instructions for Patients Receiving Chemotherapy  Today you received the following chemotherapy agents Abraxane/Gemzar  To help prevent nausea and vomiting after your treatment, we encourage you to take your nausea medication as needed   If you develop nausea and vomiting that is not controlled by your nausea medication, call the clinic.   BELOW ARE SYMPTOMS THAT SHOULD BE REPORTED IMMEDIATELY:  *FEVER GREATER THAN 100.5 F  *CHILLS WITH OR WITHOUT FEVER  NAUSEA AND VOMITING THAT IS NOT CONTROLLED WITH YOUR NAUSEA MEDICATION  *UNUSUAL SHORTNESS OF BREATH  *UNUSUAL BRUISING OR BLEEDING  TENDERNESS IN MOUTH AND THROAT WITH OR WITHOUT PRESENCE OF ULCERS  *URINARY PROBLEMS  *BOWEL PROBLEMS  UNUSUAL RASH Items with * indicate a potential emergency and should be followed up as soon as possible.  Feel free to call the clinic you have any questions or concerns. The clinic phone number is [phone].

## 2013-04-24 ENCOUNTER — Other Ambulatory Visit: Payer: Self-pay | Admitting: Oncology

## 2013-04-25 ENCOUNTER — Ambulatory Visit (HOSPITAL_COMMUNITY)
Admission: RE | Admit: 2013-04-25 | Discharge: 2013-04-25 | Disposition: A | Discharge status: Home / Self Care | Payer: Medicare Other | Source: Ambulatory Visit | Attending: Oncology | Admitting: Oncology

## 2013-04-25 ENCOUNTER — Encounter (HOSPITAL_COMMUNITY): Payer: Self-pay

## 2013-04-25 DIAGNOSIS — I77811 Abdominal aortic ectasia: Secondary | ICD-10-CM | POA: Insufficient documentation

## 2013-04-25 DIAGNOSIS — C787 Secondary malignant neoplasm of liver and intrahepatic bile duct: Secondary | ICD-10-CM | POA: Insufficient documentation

## 2013-04-25 DIAGNOSIS — N400 Enlarged prostate without lower urinary tract symptoms: Secondary | ICD-10-CM | POA: Insufficient documentation

## 2013-04-25 DIAGNOSIS — Z79899 Other long term (current) drug therapy: Secondary | ICD-10-CM | POA: Insufficient documentation

## 2013-04-25 DIAGNOSIS — N289 Disorder of kidney and ureter, unspecified: Secondary | ICD-10-CM | POA: Insufficient documentation

## 2013-04-25 DIAGNOSIS — C259 Malignant neoplasm of pancreas, unspecified: Secondary | ICD-10-CM

## 2013-04-25 DIAGNOSIS — K5730 Diverticulosis of large intestine without perforation or abscess without bleeding: Secondary | ICD-10-CM | POA: Insufficient documentation

## 2013-04-25 MED ORDER — IOHEXOL 300 MG/ML  SOLN
100.0000 mL | Freq: Once | INTRAMUSCULAR | Status: AC | PRN
  Administered 2013-04-25: 100 mL via INTRAVENOUS

## 2013-04-26 ENCOUNTER — Telehealth: Payer: Self-pay | Admitting: Oncology

## 2013-04-26 ENCOUNTER — Ambulatory Visit (HOSPITAL_BASED_OUTPATIENT_CLINIC_OR_DEPARTMENT_OTHER): Payer: Medicare Other | Admitting: Oncology

## 2013-04-26 ENCOUNTER — Other Ambulatory Visit (HOSPITAL_BASED_OUTPATIENT_CLINIC_OR_DEPARTMENT_OTHER): Payer: Medicare Other | Admitting: Lab

## 2013-04-26 ENCOUNTER — Ambulatory Visit (HOSPITAL_BASED_OUTPATIENT_CLINIC_OR_DEPARTMENT_OTHER): Payer: Medicare Other

## 2013-04-26 VITALS — BP 109/65 | HR 79 | Temp 98.0°F | Resp 18 | Ht 73.0 in | Wt 217.6 lb

## 2013-04-26 DIAGNOSIS — C259 Malignant neoplasm of pancreas, unspecified: Secondary | ICD-10-CM

## 2013-04-26 DIAGNOSIS — C787 Secondary malignant neoplasm of liver and intrahepatic bile duct: Secondary | ICD-10-CM

## 2013-04-26 DIAGNOSIS — C252 Malignant neoplasm of tail of pancreas: Secondary | ICD-10-CM

## 2013-04-26 DIAGNOSIS — Z23 Encounter for immunization: Secondary | ICD-10-CM

## 2013-04-26 DIAGNOSIS — Z5111 Encounter for antineoplastic chemotherapy: Secondary | ICD-10-CM

## 2013-04-26 DIAGNOSIS — E119 Type 2 diabetes mellitus without complications: Secondary | ICD-10-CM

## 2013-04-26 LAB — COMPREHENSIVE METABOLIC PANEL (CC13)
AST: 15 U/L (ref 5–34)
Albumin: 3.5 g/dL (ref 3.5–5.0)
Alkaline Phosphatase: 47 U/L (ref 40–150)
Anion Gap: 8 mEq/L (ref 3–11)
BUN: 13.7 mg/dL (ref 7.0–26.0)
Chloride: 107 mEq/L (ref 98–109)
Potassium: 4.3 mEq/L (ref 3.5–5.1)
Sodium: 138 mEq/L (ref 136–145)
Total Bilirubin: 0.52 mg/dL (ref 0.20–1.20)
Total Protein: 6.8 g/dL (ref 6.4–8.3)

## 2013-04-26 LAB — CBC WITH DIFFERENTIAL/PLATELET
BASO%: 0.7 % (ref 0.0–2.0)
Basophils Absolute: 0 10*3/uL (ref 0.0–0.1)
EOS%: 1.9 % (ref 0.0–7.0)
HGB: 10.3 g/dL — ABNORMAL LOW (ref 13.0–17.1)
MCH: 29.7 pg (ref 27.2–33.4)
MCHC: 32.8 g/dL (ref 32.0–36.0)
MONO#: 0.5 10*3/uL (ref 0.1–0.9)
MONO%: 8.1 % (ref 0.0–14.0)
NEUT%: 75.1 % — ABNORMAL HIGH (ref 39.0–75.0)
RDW: 16 % — ABNORMAL HIGH (ref 11.0–14.6)
lymph#: 0.8 10*3/uL — ABNORMAL LOW (ref 0.9–3.3)

## 2013-04-26 MED ORDER — INFLUENZA VAC SPLIT QUAD 0.5 ML IM SUSP
0.5000 mL | Freq: Once | INTRAMUSCULAR | Status: DC
  Filled 2013-04-26: qty 0.5

## 2013-04-26 MED ORDER — DEXAMETHASONE SODIUM PHOSPHATE 10 MG/ML IJ SOLN
5.0000 mg | Freq: Once | INTRAMUSCULAR | Status: AC
  Administered 2013-04-26: 5 mg via INTRAVENOUS

## 2013-04-26 MED ORDER — DEXAMETHASONE SODIUM PHOSPHATE 10 MG/ML IJ SOLN
INTRAMUSCULAR | Status: AC
  Filled 2013-04-26: qty 1

## 2013-04-26 MED ORDER — HEPARIN SOD (PORK) LOCK FLUSH 100 UNIT/ML IV SOLN
500.0000 [IU] | Freq: Once | INTRAVENOUS | Status: AC | PRN
  Administered 2013-04-26: 500 [IU]
  Filled 2013-04-26: qty 5

## 2013-04-26 MED ORDER — PACLITAXEL PROTEIN-BOUND CHEMO INJECTION 100 MG
80.0000 mg/m2 | Freq: Once | INTRAVENOUS | Status: AC
  Administered 2013-04-26: 175 mg via INTRAVENOUS
  Filled 2013-04-26: qty 35

## 2013-04-26 MED ORDER — SODIUM CHLORIDE 0.9 % IJ SOLN
10.0000 mL | INTRAMUSCULAR | Status: DC | PRN
  Administered 2013-04-26: 10 mL
  Filled 2013-04-26: qty 10

## 2013-04-26 MED ORDER — SODIUM CHLORIDE 0.9 % IV SOLN
Freq: Once | INTRAVENOUS | Status: AC
  Administered 2013-04-26: 11:00:00 via INTRAVENOUS

## 2013-04-26 MED ORDER — SODIUM CHLORIDE 0.9 % IV SOLN
560.0000 mg/m2 | Freq: Once | INTRAVENOUS | Status: AC
  Administered 2013-04-26: 1254 mg via INTRAVENOUS
  Filled 2013-04-26: qty 32.98

## 2013-04-26 MED ORDER — ONDANSETRON 8 MG/NS 50 ML IVPB
INTRAVENOUS | Status: AC
  Filled 2013-04-26: qty 8

## 2013-04-26 MED ORDER — ONDANSETRON 8 MG/50ML IVPB (CHCC)
8.0000 mg | Freq: Once | INTRAVENOUS | Status: AC
  Administered 2013-04-26: 8 mg via INTRAVENOUS

## 2013-04-26 NOTE — Patient Instructions (Signed)
[facility] Discharge Instructions for Patients Receiving Chemotherapy  Today you received the following chemotherapy agents: Abraxane and Gemzar.  To help prevent nausea and vomiting after your treatment, we encourage you to take your nausea medication: Compazine. Take one every six hours as needed for nausea.   If you develop nausea and vomiting that is not controlled by your nausea medication, call the clinic.   BELOW ARE SYMPTOMS THAT SHOULD BE REPORTED IMMEDIATELY:  *FEVER GREATER THAN 100.5 F  *CHILLS WITH OR WITHOUT FEVER  NAUSEA AND VOMITING THAT IS NOT CONTROLLED WITH YOUR NAUSEA MEDICATION  *UNUSUAL SHORTNESS OF BREATH  *UNUSUAL BRUISING OR BLEEDING  TENDERNESS IN MOUTH AND THROAT WITH OR WITHOUT PRESENCE OF ULCERS  *URINARY PROBLEMS  *BOWEL PROBLEMS  UNUSUAL RASH Items with * indicate a potential emergency and should be followed up as soon as possible.  Feel free to call the clinic should you have any questions or concerns. The clinic phone number is [phone].

## 2013-04-26 NOTE — Telephone Encounter (Signed)
gv and printed appt sched and avs for pt for NOV and DEC   sed added 12.23.14 tx

## 2013-04-26 NOTE — Progress Notes (Signed)
   [facility]    OFFICE PROGRESS NOTE   INTERVAL HISTORY:   He returns for scheduled followup with metastatic pancreas cancer. He continues gemcitabine/Abraxane. No fever, nausea, or neuropathy symptoms. He feels well. Good energy level. No significant pain.  He reports "sweats "in the evening intermittently. This predated the diagnosis of cancer. The blood sugar is sometimes low and at other times high.  Objective:  Vital signs in last 24 hours:  Blood pressure 109/65, pulse 79, temperature 98 F (36.7 C), temperature source Oral, resp. rate 18, height 6\' 1"  (1.854 m), weight 217 lb 9.6 oz (98.703 kg), SpO2 99.00%.    HEENT: Neck without mass, no thrush or ulcers Resp: Lungs clear bilaterally Cardio: Regular rate and rhythm with an occasional pause GI: No hepatomegaly, no mass Vascular: No leg edema Neuro: Mild loss of vibratory sense at the fingertips bilaterally    Portacath/PICC-without erythema  Lab Results:  Lab Results  Component Value Date   WBC 5.9 04/26/2013   HGB 10.3* 04/26/2013   HCT 31.4* 04/26/2013   MCV 90.5 04/26/2013   PLT 182 04/26/2013   ANC 4.4  X-rays: Restaging CT of the abdomen and pelvis on 04/25/2013, compared 01/26/2013: A central right liver lesion is smaller. A lateral segment left liver lesion is not apparent on the current image. An inferior left hepatic lesion is smaller. Small right-sided lesions are improved to resolved. The pancreas body/tail mass is smaller. No new or progressive disease identified.    Medications: I have reviewed the patient's current medications.  Assessment/Plan: 1. Adenocarcinoma of the pancreas status post ultrasound-guided biopsy of a liver lesion 10/18/2012 consistent with metastatic pancreas cancer. CT 10/11/2012 revealed a pancreas tail mass and liver metastases. CA 19-9 in normal range on 10/24/2012. Initiation of gemcitabine/Abraxane on a day 1, day 8 day 15 schedule on 10/28/2012. Current  schedule is day one/day 8.  Restaging CT 01/26/2013 revealed a decrease in the size of liver metastases and no new lesions  Gemcitabine/Abraxane continued on a day 1, day 8 schedule. Restaging CT 04/25/2013 with a decrease in the size of the pancreas mass and liver lesions 2. Abdominal pain secondary to the pancreas mass. Improved. 3. Anorexia/weight loss. Improved. 4. Diabetes. Exacerbated by steroid premedication. Decadron was reduced to 5 mg after cycle 1 day 1. 5. History of coronary artery disease. 6. Headache following cycle 1 day 1 gemcitabine/Abraxane. Question related to Zofran.  7. History of thrombocytopenia secondary to chemotherapy. 8. Mild to moderate loss of vibratory sense in the fingertips. Question secondary to Abraxane versus diabetes. Not interfering with activity at present.   Disposition:  Mr. Nosal appears stable. He continues to tolerate the chemotherapy well and the restaging CT reveals continued improvement in the metastatic tumor burden. The plan is to continue gemcitabine/Abraxane on a day one/day 8 schedule.  The sweats are most likely not related to pancreas cancer or the chemotherapy. He plans to see Dr. Pete Glatter for diabetes management.  He received an influenza vaccine today.  He will return for day 8 chemotherapy on 05/03/2013. He is scheduled for an office visit on 05/17/2013.   Thornton Papas, MD  04/26/2013  12:17 PM

## 2013-04-27 ENCOUNTER — Ambulatory Visit (INDEPENDENT_AMBULATORY_CARE_PROVIDER_SITE_OTHER): Payer: Medicare Other | Admitting: Interventional Cardiology

## 2013-04-27 ENCOUNTER — Encounter: Payer: Self-pay | Admitting: Interventional Cardiology

## 2013-04-27 VITALS — BP 125/76 | HR 70 | Ht 73.0 in | Wt 219.0 lb

## 2013-04-27 DIAGNOSIS — E785 Hyperlipidemia, unspecified: Secondary | ICD-10-CM

## 2013-04-27 DIAGNOSIS — I2510 Atherosclerotic heart disease of native coronary artery without angina pectoris: Secondary | ICD-10-CM

## 2013-04-27 DIAGNOSIS — C259 Malignant neoplasm of pancreas, unspecified: Secondary | ICD-10-CM

## 2013-04-27 DIAGNOSIS — I10 Essential (primary) hypertension: Secondary | ICD-10-CM

## 2013-04-27 NOTE — Patient Instructions (Signed)
Your physician recommends that you continue on your current medications as directed. Please refer to the Current Medication list given to you today.  Your physician wants you to follow-up in: 1 year. You will receive a reminder letter in the mail two months in advance. If you don't receive a letter, please call our office to schedule the follow-up appointment.  

## 2013-04-27 NOTE — Progress Notes (Signed)
Patient ID: LEVESTER WALDRIDGE, male   DOB: [birth year], 78 y.o.   MRN: [id number]    [address] [place], [place]  [address] Phone: [phone] Fax:  [phone]  Date:  04/27/2013   ID:  PAYTEN BEAUMIER, DOB 1934/12/06, MRN [id number]  PCP:  Ginette Otto, MD   ASSESSMENT:  1. Coronary artery disease, stable. Chest pain episodes aren't noncardiac 2. Hypertension, under good control 3. Hyperlipidemia followed by primary care 4. Metastatic pancreatic cancer on chemotherapy   PLAN:  1. Continue cardiac therapy as listed no specific cardiac workup is felt to be indicated. 2. Clinical followup in one year   SUBJECTIVE: FOYE DAMRON is a 78 y.o. male who has intermittent tingling and stinging in the left subcostal region. It lasts anywhere from 10-30 seconds. It is nonexertional. There is no chest tightness or arm discomfort. He denies dyspnea. No sustained palpitations or syncope.   Wt Readings from Last 3 Encounters:  04/27/13 219 lb (99.338 kg)  04/26/13 217 lb 9.6 oz (98.703 kg)  04/05/13 233 lb (105.688 kg)     Past Medical History  Diagnosis Date  . Diabetes mellitus     type 2  . Kidney stones   . HTN (hypertension)   . HLD (hyperlipidemia)   . CAD (coronary artery disease)     s/p 4v CABG 1990s and redo CABG 2006; cath 2007 showed patent grafts  . Carotid disease, bilateral     06/2010 carotid doppler <40% RICA stenosis and 40-60% LICA stenosis  . Gastric ulcer 2003  . Diverticulitis   . Anal fissure     s/p repair  . Shingles   . Cervical spondylosis   . Sacroiliitis   . Pancreas cancer 10/2012    Current Outpatient Prescriptions  Medication Sig Dispense Refill  . Artificial Tear Solution (JUST TEARS EYE DROPS OP) Apply 1 drop to eye at bedtime.      Marland Kitchen aspirin EC 81 MG tablet Take 81 mg by mouth daily.      Marland Kitchen glimepiride (AMARYL) 4 MG tablet Take 4 mg by mouth 2 (two) times daily.      Marland Kitchen HYDROcodone-acetaminophen (NORCO/VICODIN) 5-325  MG per tablet Take 1 tablet by mouth every 4 (four) hours as needed for pain.  30 tablet  1  . insulin detemir (LEVEMIR) 100 UNIT/ML injection Inject 40 Units into the skin at bedtime.       . isosorbide mononitrate (IMDUR) 60 MG 24 hr tablet Take 1 tablet (60 mg total) by mouth every morning.      . lidocaine-prilocaine (EMLA) cream Apply topically as needed. apply over port 1-2 hours prior to chemotherapy.  30 g  0  . metFORMIN (GLUCOPHAGE) 1000 MG tablet Take 1,000 mg by mouth 2 (two) times daily with a meal.      . nitroGLYCERIN (NITROSTAT) 0.4 MG SL tablet Place 1 tablet (0.4 mg total) under the tongue every 5 (five) minutes as needed for chest pain.  60 tablet  0  . Omega-3 Fatty Acids (FISH OIL) 1200 MG CAPS Take 1 capsule by mouth 2 (two) times daily.       . prochlorperazine (COMPAZINE) 10 MG tablet Take 1 tablet (10 mg total) by mouth every 6 (six) hours as needed (nausea).  60 tablet  1  . simvastatin (ZOCOR) 80 MG tablet Take 80 mg by mouth at bedtime.       No current facility-administered medications for this visit.  Allergies:   No Known Allergies  Social History:  The patient  reports that he quit smoking about 10 years ago. He does not have any smokeless tobacco history on file. He reports that he does not drink alcohol or use illicit drugs.   ROS:  Please see the history of present illness.   No blood in stool or urine. He denies abdominal pain, orthopnea, PND, and edema.   All other systems reviewed and negative.   OBJECTIVE: VS:  BP 125/76  Pulse 70  Ht 6\' 1"  (1.854 m)  Wt 219 lb (99.338 kg)  BMI 28.90 kg/m2 Well nourished, well developed, in no acute distress, losing hair due to chemotherapy  HEENT: normal Neck: JVD flat. Carotid bruit 2+  Cardiac:  normal S1, S2; RRR; no murmur Lungs:  clear to auscultation bilaterally, no wheezing, rhonchi or rales Abd: soft, nontender, no hepatomegaly Ext: Edema absent. Pulses 2+  Skin: warm and dry Neuro:  CNs 2-12 intact,  no focal abnormalities noted  EKG:  Not repeated       Signed, Darci Needle III, MD 04/27/2013 9:32 AM

## 2013-05-03 ENCOUNTER — Ambulatory Visit (HOSPITAL_BASED_OUTPATIENT_CLINIC_OR_DEPARTMENT_OTHER): Payer: Medicare Other

## 2013-05-03 ENCOUNTER — Other Ambulatory Visit (HOSPITAL_BASED_OUTPATIENT_CLINIC_OR_DEPARTMENT_OTHER): Payer: Medicare Other | Admitting: Lab

## 2013-05-03 VITALS — BP 133/78 | HR 71 | Temp 98.0°F | Resp 19

## 2013-05-03 DIAGNOSIS — C252 Malignant neoplasm of tail of pancreas: Secondary | ICD-10-CM

## 2013-05-03 DIAGNOSIS — C787 Secondary malignant neoplasm of liver and intrahepatic bile duct: Secondary | ICD-10-CM

## 2013-05-03 DIAGNOSIS — C259 Malignant neoplasm of pancreas, unspecified: Secondary | ICD-10-CM

## 2013-05-03 DIAGNOSIS — Z23 Encounter for immunization: Secondary | ICD-10-CM

## 2013-05-03 DIAGNOSIS — Z5111 Encounter for antineoplastic chemotherapy: Secondary | ICD-10-CM

## 2013-05-03 LAB — CBC WITH DIFFERENTIAL/PLATELET
BASO%: 0.6 % (ref 0.0–2.0)
EOS%: 1.3 % (ref 0.0–7.0)
Eosinophils Absolute: 0 10*3/uL (ref 0.0–0.5)
MCHC: 33 g/dL (ref 32.0–36.0)
MCV: 88.8 fL (ref 79.3–98.0)
MONO#: 0.4 10*3/uL (ref 0.1–0.9)
MONO%: 12.4 % (ref 0.0–14.0)
NEUT#: 2.5 10*3/uL (ref 1.5–6.5)
NEUT%: 70.3 % (ref 39.0–75.0)
RBC: 3.34 10*6/uL — ABNORMAL LOW (ref 4.20–5.82)
RDW: 16.6 % — ABNORMAL HIGH (ref 11.0–14.6)
WBC: 3.5 10*3/uL — ABNORMAL LOW (ref 4.0–10.3)

## 2013-05-03 MED ORDER — DEXAMETHASONE SODIUM PHOSPHATE 10 MG/ML IJ SOLN
5.0000 mg | Freq: Once | INTRAMUSCULAR | Status: AC
  Administered 2013-05-03: 5 mg via INTRAVENOUS

## 2013-05-03 MED ORDER — ONDANSETRON 8 MG/NS 50 ML IVPB
INTRAVENOUS | Status: AC
  Filled 2013-05-03: qty 8

## 2013-05-03 MED ORDER — INFLUENZA VAC SPLIT QUAD 0.5 ML IM SUSP
0.5000 mL | Freq: Once | INTRAMUSCULAR | Status: AC
  Administered 2013-05-03: 0.5 mL via INTRAMUSCULAR
  Filled 2013-05-03: qty 0.5

## 2013-05-03 MED ORDER — SODIUM CHLORIDE 0.9 % IV SOLN
560.0000 mg/m2 | Freq: Once | INTRAVENOUS | Status: AC
  Administered 2013-05-03: 1254 mg via INTRAVENOUS
  Filled 2013-05-03: qty 33

## 2013-05-03 MED ORDER — SODIUM CHLORIDE 0.9 % IJ SOLN
10.0000 mL | INTRAMUSCULAR | Status: DC | PRN
  Administered 2013-05-03: 10 mL
  Filled 2013-05-03: qty 10

## 2013-05-03 MED ORDER — PACLITAXEL PROTEIN-BOUND CHEMO INJECTION 100 MG
80.0000 mg/m2 | Freq: Once | INTRAVENOUS | Status: AC
  Administered 2013-05-03: 175 mg via INTRAVENOUS
  Filled 2013-05-03: qty 35

## 2013-05-03 MED ORDER — ONDANSETRON 8 MG/50ML IVPB (CHCC)
8.0000 mg | Freq: Once | INTRAVENOUS | Status: AC
  Administered 2013-05-03: 8 mg via INTRAVENOUS

## 2013-05-03 MED ORDER — SODIUM CHLORIDE 0.9 % IV SOLN
Freq: Once | INTRAVENOUS | Status: AC
  Administered 2013-05-03: 10:00:00 via INTRAVENOUS

## 2013-05-03 MED ORDER — DEXAMETHASONE SODIUM PHOSPHATE 10 MG/ML IJ SOLN
INTRAMUSCULAR | Status: AC
  Filled 2013-05-03: qty 1

## 2013-05-03 MED ORDER — HEPARIN SOD (PORK) LOCK FLUSH 100 UNIT/ML IV SOLN
500.0000 [IU] | Freq: Once | INTRAVENOUS | Status: AC | PRN
  Administered 2013-05-03: 500 [IU]
  Filled 2013-05-03: qty 5

## 2013-05-03 NOTE — Patient Instructions (Signed)
[facility] Discharge Instructions for Patients Receiving Chemotherapy  Today you received the following chemotherapy agents Gemzar and Abraxane.  To help prevent nausea and vomiting after your treatment, we encourage you to take your nausea medication.   If you develop nausea and vomiting that is not controlled by your nausea medication, call the clinic.   BELOW ARE SYMPTOMS THAT SHOULD BE REPORTED IMMEDIATELY:  *FEVER GREATER THAN 100.5 F  *CHILLS WITH OR WITHOUT FEVER  NAUSEA AND VOMITING THAT IS NOT CONTROLLED WITH YOUR NAUSEA MEDICATION  *UNUSUAL SHORTNESS OF BREATH  *UNUSUAL BRUISING OR BLEEDING  TENDERNESS IN MOUTH AND THROAT WITH OR WITHOUT PRESENCE OF ULCERS  *URINARY PROBLEMS  *BOWEL PROBLEMS  UNUSUAL RASH Items with * indicate a potential emergency and should be followed up as soon as possible.  Feel free to call the clinic you have any questions or concerns. The clinic phone number is [phone].    

## 2013-05-12 ENCOUNTER — Other Ambulatory Visit: Payer: Self-pay | Admitting: Oncology

## 2013-05-17 ENCOUNTER — Ambulatory Visit (HOSPITAL_BASED_OUTPATIENT_CLINIC_OR_DEPARTMENT_OTHER): Payer: Medicare Other

## 2013-05-17 ENCOUNTER — Ambulatory Visit (HOSPITAL_BASED_OUTPATIENT_CLINIC_OR_DEPARTMENT_OTHER): Payer: Medicare Other | Admitting: Nurse Practitioner

## 2013-05-17 ENCOUNTER — Telehealth: Payer: Self-pay | Admitting: Oncology

## 2013-05-17 ENCOUNTER — Other Ambulatory Visit (HOSPITAL_BASED_OUTPATIENT_CLINIC_OR_DEPARTMENT_OTHER): Payer: Medicare Other | Admitting: Lab

## 2013-05-17 VITALS — BP 135/80 | HR 78 | Temp 98.1°F | Resp 20 | Ht 73.0 in | Wt 219.3 lb

## 2013-05-17 DIAGNOSIS — C252 Malignant neoplasm of tail of pancreas: Secondary | ICD-10-CM

## 2013-05-17 DIAGNOSIS — R0989 Other specified symptoms and signs involving the circulatory and respiratory systems: Secondary | ICD-10-CM

## 2013-05-17 DIAGNOSIS — C259 Malignant neoplasm of pancreas, unspecified: Secondary | ICD-10-CM

## 2013-05-17 DIAGNOSIS — C787 Secondary malignant neoplasm of liver and intrahepatic bile duct: Secondary | ICD-10-CM

## 2013-05-17 DIAGNOSIS — E119 Type 2 diabetes mellitus without complications: Secondary | ICD-10-CM

## 2013-05-17 DIAGNOSIS — H019 Unspecified inflammation of eyelid: Secondary | ICD-10-CM

## 2013-05-17 DIAGNOSIS — Z95828 Presence of other vascular implants and grafts: Secondary | ICD-10-CM

## 2013-05-17 DIAGNOSIS — Z5111 Encounter for antineoplastic chemotherapy: Secondary | ICD-10-CM

## 2013-05-17 DIAGNOSIS — R0609 Other forms of dyspnea: Secondary | ICD-10-CM

## 2013-05-17 LAB — CBC WITH DIFFERENTIAL/PLATELET
BASO%: 0.4 % (ref 0.0–2.0)
Basophils Absolute: 0 10e3/uL (ref 0.0–0.1)
EOS%: 2.1 % (ref 0.0–7.0)
Eosinophils Absolute: 0.1 10e3/uL (ref 0.0–0.5)
HCT: 29.1 % — ABNORMAL LOW (ref 38.4–49.9)
HGB: 9.2 g/dL — ABNORMAL LOW (ref 13.0–17.1)
LYMPH%: 15.2 % (ref 14.0–49.0)
MCH: 28.6 pg (ref 27.2–33.4)
MCHC: 31.6 g/dL — ABNORMAL LOW (ref 32.0–36.0)
MCV: 90.4 fL (ref 79.3–98.0)
MONO#: 0.5 10e3/uL (ref 0.1–0.9)
MONO%: 8.7 % (ref 0.0–14.0)
NEUT#: 3.9 10e3/uL (ref 1.5–6.5)
NEUT%: 73.6 % (ref 39.0–75.0)
Platelets: 218 10e3/uL (ref 140–400)
RBC: 3.22 10e6/uL — ABNORMAL LOW (ref 4.20–5.82)
RDW: 15.7 % — ABNORMAL HIGH (ref 11.0–14.6)
WBC: 5.3 10e3/uL (ref 4.0–10.3)
lymph#: 0.8 10e3/uL — ABNORMAL LOW (ref 0.9–3.3)

## 2013-05-17 LAB — COMPREHENSIVE METABOLIC PANEL (CC13)
ALT: 15 U/L (ref 0–55)
Albumin: 3.2 g/dL — ABNORMAL LOW (ref 3.5–5.0)
Anion Gap: 8 mEq/L (ref 3–11)
CO2: 23 mEq/L (ref 22–29)
Calcium: 8.8 mg/dL (ref 8.4–10.4)
Chloride: 108 mEq/L (ref 98–109)
Creatinine: 1.2 mg/dL (ref 0.7–1.3)
Potassium: 4.4 mEq/L (ref 3.5–5.1)

## 2013-05-17 MED ORDER — SODIUM CHLORIDE 0.9 % IJ SOLN
10.0000 mL | INTRAMUSCULAR | Status: DC | PRN
  Administered 2013-05-17: 10 mL
  Filled 2013-05-17: qty 10

## 2013-05-17 MED ORDER — ONDANSETRON 8 MG/50ML IVPB (CHCC)
8.0000 mg | Freq: Once | INTRAVENOUS | Status: AC
  Administered 2013-05-17: 8 mg via INTRAVENOUS

## 2013-05-17 MED ORDER — SODIUM CHLORIDE 0.9 % IV SOLN
Freq: Once | INTRAVENOUS | Status: AC
  Administered 2013-05-17: 11:00:00 via INTRAVENOUS

## 2013-05-17 MED ORDER — PACLITAXEL PROTEIN-BOUND CHEMO INJECTION 100 MG
80.0000 mg/m2 | Freq: Once | INTRAVENOUS | Status: AC
  Administered 2013-05-17: 175 mg via INTRAVENOUS
  Filled 2013-05-17: qty 35

## 2013-05-17 MED ORDER — HEPARIN SOD (PORK) LOCK FLUSH 100 UNIT/ML IV SOLN
500.0000 [IU] | Freq: Once | INTRAVENOUS | Status: AC | PRN
  Administered 2013-05-17: 500 [IU]
  Filled 2013-05-17: qty 5

## 2013-05-17 MED ORDER — DEXAMETHASONE SODIUM PHOSPHATE 10 MG/ML IJ SOLN
INTRAMUSCULAR | Status: AC
  Filled 2013-05-17: qty 1

## 2013-05-17 MED ORDER — SODIUM CHLORIDE 0.9 % IV SOLN
560.0000 mg/m2 | Freq: Once | INTRAVENOUS | Status: AC
  Administered 2013-05-17: 1254 mg via INTRAVENOUS
  Filled 2013-05-17: qty 33

## 2013-05-17 MED ORDER — DEXAMETHASONE SODIUM PHOSPHATE 10 MG/ML IJ SOLN
5.0000 mg | Freq: Once | INTRAMUSCULAR | Status: AC
  Administered 2013-05-17: 5 mg via INTRAVENOUS

## 2013-05-17 MED ORDER — LIDOCAINE-PRILOCAINE 2.5-2.5 % EX CREA: TOPICAL_CREAM | CUTANEOUS | Status: AC | PRN

## 2013-05-17 MED ORDER — ONDANSETRON 8 MG/NS 50 ML IVPB
INTRAVENOUS | Status: AC
  Filled 2013-05-17: qty 8

## 2013-05-17 NOTE — Telephone Encounter (Signed)
Gave pt appt for lab,md and chemo for December , emaileded Michelle regarding chemo 12/31 and January 2015

## 2013-05-17 NOTE — Patient Instructions (Signed)
[facility] Discharge Instructions for Patients Receiving Chemotherapy  Today you received the following chemotherapy agents Gemzar and Abraxane.  To help prevent nausea and vomiting after your treatment, we encourage you to take your nausea medication.   If you develop nausea and vomiting that is not controlled by your nausea medication, call the clinic.   BELOW ARE SYMPTOMS THAT SHOULD BE REPORTED IMMEDIATELY:  *FEVER GREATER THAN 100.5 F  *CHILLS WITH OR WITHOUT FEVER  NAUSEA AND VOMITING THAT IS NOT CONTROLLED WITH YOUR NAUSEA MEDICATION  *UNUSUAL SHORTNESS OF BREATH  *UNUSUAL BRUISING OR BLEEDING  TENDERNESS IN MOUTH AND THROAT WITH OR WITHOUT PRESENCE OF ULCERS  *URINARY PROBLEMS  *BOWEL PROBLEMS  UNUSUAL RASH Items with * indicate a potential emergency and should be followed up as soon as possible.  Feel free to call the clinic you have any questions or concerns. The clinic phone number is [phone].    

## 2013-05-17 NOTE — Progress Notes (Signed)
OFFICE PROGRESS NOTE  Interval history:  Christian Kaiser returns for followup of metastatic pancreas cancer. He continues gemcitabine/Abraxane. He denies nausea/vomiting. No mouth sores. No diarrhea. He has a good appetite. He reports his weight is stable. He denies abdominal pain. He had one episode of "tingling" in the left hand and then in the left foot. The tingling was present briefly. No numbness. He has mild dyspnea on exertion. He coughs periodically. No fever.  Objective: Blood pressure 135/80, pulse 78, temperature 98.1 F (36.7 C), temperature source Oral, resp. rate 20, height 6\' 1"  (1.854 m), weight 219 lb 4.8 oz (99.474 kg), SpO2 96.00%.  No thrush. Lungs are clear. No wheezes or rales. Regular cardiac rhythm. Port-A-Cath site without erythema. Abdomen soft and nontender. No mass. No hepatomegaly. Extremities without edema. Calves soft and nontender. Approximate 1-1/2 cm soft, raised lesion lateral to the left eye. No induration or erythema.  Lab Results: Lab Results  Component Value Date   WBC 5.3 05/17/2013   HGB 9.2* 05/17/2013   HCT 29.1* 05/17/2013   MCV 90.4 05/17/2013   PLT 218 05/17/2013    Chemistry:    Chemistry      Component Value Date/Time   NA 138 04/26/2013 0858   NA 135 11/28/2011 0515   K 4.3 04/26/2013 0858   K 4.0 11/28/2011 0515   CL 107 11/25/2012 0934   CL 101 11/28/2011 0515   CO2 23 04/26/2013 0858   CO2 22 11/28/2011 0515   BUN 13.7 04/26/2013 0858   BUN 26* 11/28/2011 0515   CREATININE 1.3 04/26/2013 0858   CREATININE 1.29 11/28/2011 0515      Component Value Date/Time   CALCIUM 9.3 04/26/2013 0858   CALCIUM 9.2 11/28/2011 0515   ALKPHOS 47 04/26/2013 0858   ALKPHOS 43 11/27/2011 2229   AST 15 04/26/2013 0858   AST 14 11/27/2011 2229   ALT 12 04/26/2013 0858   ALT 13 11/27/2011 2229   BILITOT 0.52 04/26/2013 0858   BILITOT 0.2* 11/27/2011 2229       Studies/Results: Ct Abdomen Pelvis W Contrast  04/25/2013   CLINICAL DATA:  Pancreatic cancer  diagnosed in May. Ongoing chemotherapy.  EXAM: CT ABDOMEN AND PELVIS WITH CONTRAST  TECHNIQUE: Multidetector CT imaging of the abdomen and pelvis was performed using the standard protocol following bolus administration of intravenous contrast.  CONTRAST:  OMNIPAQUE IOHEXOL 300 MG/ML  SOLN  COMPARISON:  01/26/2013  FINDINGS: Lower Chest: Probable scarring at the left lung base. This is similar, measuring 1.2 cm. More medial left base scarring is also not significantly changed. Cardiomegaly, without pericardial or pleural effusion.  Abdomen/Pelvis: Arterial phase images demonstrate vague hyper enhancement within the lateral segment left liver lobe and subcapsular medial segment left liver lobe. Nonspecific. Example images 14 and 24 of series 2. No hypervascular lesions within the pancreas.  Portal venous phase images demonstrate a central right liver lobe hypo attenuating focus that measures 1.2 cm on image 29/ series 6 compares with 1.7 cm on the prior exam. Lateral segment left liver lobe hypo enhancement described on the prior exam is not readily apparent today. Inferior left hepatic lobe 1.9 x 1.5 cm lesion on image 37 is decreased from 2.3 x 1.6 cm on the prior exam (when remeasured). Smaller right-sided lesions which are improved to resolved today.  Normal spleen, stomach.  Pancreatic body/ tail junction infiltrative mass measures 5.0 x 2.5 cm on image 32/ series 6. Compare 5.6 x 2.9 cm at the same level on  the prior exam.  Normal gallbladder, biliary tract. Stable left greater than right bilateral adrenal thickening and nodularity. Upper pole right renal lesion which is likely a complex cyst, 2.3 cm. Normal left kidney.  Gastroepiploic collaterals secondary to splenic vein insufficiency. Infrarenal abdominal aortic dilatation at maximally 2.7 cm. Unchanged. No retroperitoneal or retrocrural adenopathy.  Scattered colonic diverticula. Normal terminal ileum and appendix. Normal small bowel without abdominal  ascites. Similar mild aneurysmal dilatation of the right common iliac artery at 1.6 cm. No pelvic adenopathy. Normal urinary bladder. Moderate prostatomegaly. No significant free fluid.  Bones/Musculoskeletal: Presumed degenerative fusion of the bilateral sacroiliac joints.  IMPRESSION: 1. Response to therapy of pancreatic primary and hepatic metastasis. 2. No new or progressive disease identified.   Electronically Signed   By: Jeronimo Greaves M.D.   On: 04/25/2013 13:10    Medications: I have reviewed the patient's current medications.  Assessment/Plan:  1. Adenocarcinoma of the pancreas status post ultrasound-guided biopsy of a liver lesion 10/18/2012 consistent with metastatic pancreas cancer. CT 10/11/2012 revealed a pancreas tail mass and liver metastases. CA 19-9 in normal range on 10/24/2012. Initiation of gemcitabine/Abraxane on a day 1, day 8 day 15 schedule on 10/28/2012. Current schedule is day one/day 8.  Restaging CT 01/26/2013 revealed a decrease in the size of liver metastases and no new lesions  Gemcitabine/Abraxane continued on a day 1, day 8 schedule.  Restaging CT 04/25/2013 with a decrease in the size of the pancreas mass and liver lesions. Gemcitabine/Abraxane continued on a day 1, day 8 schedule. 2. Abdominal pain secondary to the pancreas mass. Improved. 3. Anorexia/weight loss. Improved. 4. Diabetes. Exacerbated by steroid premedication. Decadron was reduced to 5 mg after cycle 1 day 1. 5. History of coronary artery disease. 6. Headache following cycle 1 day 1 gemcitabine/Abraxane. Question related to Zofran.  7. History of thrombocytopenia secondary to chemotherapy. 8. Mild to moderate loss of vibratory sense in the fingertips. Question secondary to Abraxane versus diabetes. Not interfering with activity at present.  Disposition-he appears stable. Plan to continue gemcitabine/Abraxane on a day one/day 8 schedule. He will return for a followup visit on 06/06/2013. He will  contact the office in the interim with any problems. We specifically discussed any change in the skin lesion just lateral to the left eye.  Plan reviewed with Dr. Derenda Fennel, Misty Stanley ANP/GNP-BC

## 2013-05-18 ENCOUNTER — Telehealth: Payer: Self-pay | Admitting: Oncology

## 2013-05-18 ENCOUNTER — Telehealth: Payer: Self-pay | Admitting: *Deleted

## 2013-05-18 NOTE — Telephone Encounter (Signed)
Gave appts to pt's wife and advised to get appt calendar for month of December and january 2015

## 2013-05-18 NOTE — Telephone Encounter (Signed)
Last entry for 12/4 at 10:19 entered on wrong patient

## 2013-05-18 NOTE — Telephone Encounter (Signed)
Patient's wife called regarding his appt for Friday. Per the desk RN appt can be canceled. Treatment given at time of MD visit on 12/3. Wife notified

## 2013-05-18 NOTE — Telephone Encounter (Signed)
Per staff message and POF I have scheduled appts.  JMW  

## 2013-05-24 ENCOUNTER — Encounter (INDEPENDENT_AMBULATORY_CARE_PROVIDER_SITE_OTHER): Payer: Self-pay

## 2013-05-24 ENCOUNTER — Telehealth: Payer: Self-pay | Admitting: *Deleted

## 2013-05-24 ENCOUNTER — Ambulatory Visit (HOSPITAL_BASED_OUTPATIENT_CLINIC_OR_DEPARTMENT_OTHER): Payer: Medicare Other

## 2013-05-24 ENCOUNTER — Other Ambulatory Visit (HOSPITAL_BASED_OUTPATIENT_CLINIC_OR_DEPARTMENT_OTHER): Payer: Medicare Other

## 2013-05-24 VITALS — BP 123/71 | Temp 97.6°F | Resp 20

## 2013-05-24 DIAGNOSIS — C259 Malignant neoplasm of pancreas, unspecified: Secondary | ICD-10-CM

## 2013-05-24 DIAGNOSIS — Z5111 Encounter for antineoplastic chemotherapy: Secondary | ICD-10-CM

## 2013-05-24 DIAGNOSIS — C252 Malignant neoplasm of tail of pancreas: Secondary | ICD-10-CM

## 2013-05-24 DIAGNOSIS — C787 Secondary malignant neoplasm of liver and intrahepatic bile duct: Secondary | ICD-10-CM

## 2013-05-24 LAB — CBC WITH DIFFERENTIAL/PLATELET
Basophils Absolute: 0 10*3/uL (ref 0.0–0.1)
Eosinophils Absolute: 0 10*3/uL (ref 0.0–0.5)
HCT: 28.7 % — ABNORMAL LOW (ref 38.4–49.9)
HGB: 9.3 g/dL — ABNORMAL LOW (ref 13.0–17.1)
MCHC: 32.4 g/dL (ref 32.0–36.0)
MCV: 88.3 fL (ref 79.3–98.0)
MONO%: 6.7 % (ref 0.0–14.0)
NEUT#: 2.2 10*3/uL (ref 1.5–6.5)
NEUT%: 67.6 % (ref 39.0–75.0)
Platelets: 208 10*3/uL (ref 140–400)
RDW: 15.5 % — ABNORMAL HIGH (ref 11.0–14.6)
lymph#: 0.8 10*3/uL — ABNORMAL LOW (ref 0.9–3.3)

## 2013-05-24 MED ORDER — DEXAMETHASONE SODIUM PHOSPHATE 10 MG/ML IJ SOLN
INTRAMUSCULAR | Status: AC
  Filled 2013-05-24: qty 1

## 2013-05-24 MED ORDER — SODIUM CHLORIDE 0.9 % IV SOLN
Freq: Once | INTRAVENOUS | Status: AC
  Administered 2013-05-24: 10:00:00 via INTRAVENOUS

## 2013-05-24 MED ORDER — SODIUM CHLORIDE 0.9 % IJ SOLN
10.0000 mL | INTRAMUSCULAR | Status: DC | PRN
Start: 2013-05-24 — End: 2013-05-24
  Administered 2013-05-24: 10 mL
  Filled 2013-05-24: qty 10

## 2013-05-24 MED ORDER — ONDANSETRON 8 MG/NS 50 ML IVPB
INTRAVENOUS | Status: AC
  Filled 2013-05-24: qty 8

## 2013-05-24 MED ORDER — HEPARIN SOD (PORK) LOCK FLUSH 100 UNIT/ML IV SOLN
500.0000 [IU] | Freq: Once | INTRAVENOUS | Status: AC | PRN
  Administered 2013-05-24: 500 [IU]
  Filled 2013-05-24: qty 5

## 2013-05-24 MED ORDER — PACLITAXEL PROTEIN-BOUND CHEMO INJECTION 100 MG
80.0000 mg/m2 | Freq: Once | INTRAVENOUS | Status: AC
  Administered 2013-05-24: 175 mg via INTRAVENOUS
  Filled 2013-05-24: qty 35

## 2013-05-24 MED ORDER — ONDANSETRON 8 MG/50ML IVPB (CHCC)
8.0000 mg | Freq: Once | INTRAVENOUS | Status: AC
  Administered 2013-05-24: 8 mg via INTRAVENOUS

## 2013-05-24 MED ORDER — SODIUM CHLORIDE 0.9 % IV SOLN
560.0000 mg/m2 | Freq: Once | INTRAVENOUS | Status: AC
  Administered 2013-05-24: 1254 mg via INTRAVENOUS
  Filled 2013-05-24: qty 32.98

## 2013-05-24 MED ORDER — DEXAMETHASONE SODIUM PHOSPHATE 10 MG/ML IJ SOLN
5.0000 mg | Freq: Once | INTRAMUSCULAR | Status: AC
Start: 2013-05-24 — End: 2013-05-24
  Administered 2013-05-24: 5 mg via INTRAVENOUS

## 2013-05-24 NOTE — Telephone Encounter (Signed)
Per voicemail from scheduler, patient request appts for 12/31 be moved to 12/30. Appts moved and scheduler notified.  JMW

## 2013-05-24 NOTE — Patient Instructions (Signed)
[facility] Discharge Instructions for Patients Receiving Chemotherapy  Today you received the following chemotherapy agents: Abraxane and Gemzar   To help prevent nausea and vomiting after your treatment, we encourage you to take your nausea medication as prescribed.    If you develop nausea and vomiting that is not controlled by your nausea medication, call the clinic.   BELOW ARE SYMPTOMS THAT SHOULD BE REPORTED IMMEDIATELY:  *FEVER GREATER THAN 100.5 F  *CHILLS WITH OR WITHOUT FEVER  NAUSEA AND VOMITING THAT IS NOT CONTROLLED WITH YOUR NAUSEA MEDICATION  *UNUSUAL SHORTNESS OF BREATH  *UNUSUAL BRUISING OR BLEEDING  TENDERNESS IN MOUTH AND THROAT WITH OR WITHOUT PRESENCE OF ULCERS  *URINARY PROBLEMS  *BOWEL PROBLEMS  UNUSUAL RASH Items with * indicate a potential emergency and should be followed up as soon as possible.  Feel free to call the clinic you have any questions or concerns. The clinic phone number is [phone].    

## 2013-05-24 NOTE — Progress Notes (Signed)
Per Dr. Truett Perna, its OK to treat with a WBC count of 3.3 today.

## 2013-05-29 ENCOUNTER — Telehealth: Payer: Self-pay | Admitting: Oncology

## 2013-05-29 NOTE — Telephone Encounter (Signed)
Gave pt appt for lab , chemo and MD for Decenber and Sherrie Mustache 2015

## 2013-06-04 ENCOUNTER — Other Ambulatory Visit: Payer: Self-pay | Admitting: Oncology

## 2013-06-06 ENCOUNTER — Ambulatory Visit (HOSPITAL_BASED_OUTPATIENT_CLINIC_OR_DEPARTMENT_OTHER): Payer: Medicare Other | Admitting: Oncology

## 2013-06-06 ENCOUNTER — Other Ambulatory Visit (HOSPITAL_BASED_OUTPATIENT_CLINIC_OR_DEPARTMENT_OTHER): Payer: Medicare Other

## 2013-06-06 ENCOUNTER — Telehealth: Payer: Self-pay | Admitting: Oncology

## 2013-06-06 ENCOUNTER — Telehealth: Payer: Self-pay | Admitting: *Deleted

## 2013-06-06 ENCOUNTER — Encounter (INDEPENDENT_AMBULATORY_CARE_PROVIDER_SITE_OTHER): Payer: Self-pay

## 2013-06-06 ENCOUNTER — Ambulatory Visit (HOSPITAL_BASED_OUTPATIENT_CLINIC_OR_DEPARTMENT_OTHER): Payer: Medicare Other

## 2013-06-06 VITALS — BP 159/83 | HR 84 | Temp 97.0°F | Resp 20 | Ht 73.0 in | Wt 217.6 lb

## 2013-06-06 DIAGNOSIS — F329 Major depressive disorder, single episode, unspecified: Secondary | ICD-10-CM

## 2013-06-06 DIAGNOSIS — R109 Unspecified abdominal pain: Secondary | ICD-10-CM

## 2013-06-06 DIAGNOSIS — C259 Malignant neoplasm of pancreas, unspecified: Secondary | ICD-10-CM

## 2013-06-06 DIAGNOSIS — C787 Secondary malignant neoplasm of liver and intrahepatic bile duct: Secondary | ICD-10-CM

## 2013-06-06 DIAGNOSIS — G609 Hereditary and idiopathic neuropathy, unspecified: Secondary | ICD-10-CM

## 2013-06-06 DIAGNOSIS — C252 Malignant neoplasm of tail of pancreas: Secondary | ICD-10-CM

## 2013-06-06 DIAGNOSIS — F3289 Other specified depressive episodes: Secondary | ICD-10-CM

## 2013-06-06 DIAGNOSIS — E119 Type 2 diabetes mellitus without complications: Secondary | ICD-10-CM

## 2013-06-06 DIAGNOSIS — Z5111 Encounter for antineoplastic chemotherapy: Secondary | ICD-10-CM

## 2013-06-06 LAB — COMPREHENSIVE METABOLIC PANEL (CC13)
ALT: 20 U/L (ref 0–55)
AST: 19 U/L (ref 5–34)
Albumin: 3.4 g/dL — ABNORMAL LOW (ref 3.5–5.0)
Alkaline Phosphatase: 52 U/L (ref 40–150)
Anion Gap: 9 mEq/L (ref 3–11)
Glucose: 256 mg/dl — ABNORMAL HIGH (ref 70–140)
Potassium: 4.3 mEq/L (ref 3.5–5.1)
Sodium: 140 mEq/L (ref 136–145)
Total Protein: 6.7 g/dL (ref 6.4–8.3)

## 2013-06-06 LAB — CBC WITH DIFFERENTIAL/PLATELET
BASO%: 0.4 % (ref 0.0–2.0)
Eosinophils Absolute: 0.1 10*3/uL (ref 0.0–0.5)
LYMPH%: 21.3 % (ref 14.0–49.0)
MCH: 28.3 pg (ref 27.2–33.4)
MCHC: 31.8 g/dL — ABNORMAL LOW (ref 32.0–36.0)
MCV: 88.9 fL (ref 79.3–98.0)
MONO%: 8.2 % (ref 0.0–14.0)
NEUT%: 67.5 % (ref 39.0–75.0)
Platelets: 155 10*3/uL (ref 140–400)
RBC: 3.25 10*6/uL — ABNORMAL LOW (ref 4.20–5.82)

## 2013-06-06 MED ORDER — SODIUM CHLORIDE 0.9 % IV SOLN
Freq: Once | INTRAVENOUS | Status: AC
  Administered 2013-06-06: 10:00:00 via INTRAVENOUS

## 2013-06-06 MED ORDER — ONDANSETRON 8 MG/NS 50 ML IVPB
INTRAVENOUS | Status: AC
  Filled 2013-06-06: qty 8

## 2013-06-06 MED ORDER — SODIUM CHLORIDE 0.9 % IV SOLN
560.0000 mg/m2 | Freq: Once | INTRAVENOUS | Status: AC
  Administered 2013-06-06: 1254 mg via INTRAVENOUS
  Filled 2013-06-06: qty 32.98

## 2013-06-06 MED ORDER — SODIUM CHLORIDE 0.9 % IJ SOLN
10.0000 mL | INTRAMUSCULAR | Status: DC | PRN
  Administered 2013-06-06: 10 mL
  Filled 2013-06-06: qty 10

## 2013-06-06 MED ORDER — PACLITAXEL PROTEIN-BOUND CHEMO INJECTION 100 MG
80.0000 mg/m2 | Freq: Once | INTRAVENOUS | Status: AC
  Administered 2013-06-06: 175 mg via INTRAVENOUS
  Filled 2013-06-06: qty 35

## 2013-06-06 MED ORDER — DEXAMETHASONE SODIUM PHOSPHATE 10 MG/ML IJ SOLN
5.0000 mg | Freq: Once | INTRAMUSCULAR | Status: AC
  Administered 2013-06-06: 5 mg via INTRAVENOUS

## 2013-06-06 MED ORDER — DEXAMETHASONE SODIUM PHOSPHATE 10 MG/ML IJ SOLN
INTRAMUSCULAR | Status: AC
  Filled 2013-06-06: qty 1

## 2013-06-06 MED ORDER — HEPARIN SOD (PORK) LOCK FLUSH 100 UNIT/ML IV SOLN
500.0000 [IU] | Freq: Once | INTRAVENOUS | Status: AC | PRN
  Administered 2013-06-06: 500 [IU]
  Filled 2013-06-06: qty 5

## 2013-06-06 MED ORDER — ONDANSETRON 8 MG/50ML IVPB (CHCC)
8.0000 mg | Freq: Once | INTRAVENOUS | Status: AC
  Administered 2013-06-06: 8 mg via INTRAVENOUS

## 2013-06-06 NOTE — Patient Instructions (Signed)
[facility] Discharge Instructions for Patients Receiving Chemotherapy  Today you received the following chemotherapy agents ABRAXANE AND GEMZAR  To help prevent nausea and vomiting after your treatment, we encourage you to take your nausea medication IF NEEDED   If you develop nausea and vomiting that is not controlled by your nausea medication, call the clinic.   BELOW ARE SYMPTOMS THAT SHOULD BE REPORTED IMMEDIATELY:  *FEVER GREATER THAN 100.5 F  *CHILLS WITH OR WITHOUT FEVER  NAUSEA AND VOMITING THAT IS NOT CONTROLLED WITH YOUR NAUSEA MEDICATION  *UNUSUAL SHORTNESS OF BREATH  *UNUSUAL BRUISING OR BLEEDING  TENDERNESS IN MOUTH AND THROAT WITH OR WITHOUT PRESENCE OF ULCERS  *URINARY PROBLEMS  *BOWEL PROBLEMS  UNUSUAL RASH Items with * indicate a potential emergency and should be followed up as soon as possible.  Feel free to call the clinic you have any questions or concerns. The clinic phone number is [phone].

## 2013-06-06 NOTE — Telephone Encounter (Signed)
gv and printed appt sched and avs for pt for DEC adn Jan 2015...sed added tx...emailed MW to add tx for 1.14.15

## 2013-06-06 NOTE — Progress Notes (Signed)
   [facility]    OFFICE PROGRESS NOTE   INTERVAL HISTORY:   He returns for scheduled followup with pancreas cancer. He was last treated with gemcitabine/Abraxane on 05/24/2013. No nausea or neuropathy symptoms. His wife reports that [name] has intermittent episodes of "crying ". She feels she is depressed. He reports to days of a left sided abdominal pain, this has resolved.  Good appetite.  He feels the crying episodes were related to not being able to get out of the house with the bad weather.  Objective:  Vital signs in last 24 hours:  Blood pressure 159/83, pulse 84, temperature 97 F (36.1 C), temperature source Oral, resp. rate 20, height 6\' 1"  (1.854 m), weight 217 lb 9.6 oz (98.703 kg).    HEENT: No thrush or ulcers Resp: Lungs clear bilaterally Cardio: Regular rate and rhythm GI: No hepatosplenomegaly, nontender, no mass Vascular: No leg edema Neuro: Mild decrease in vibratory sense at the fingertips bilaterally    Portacath/PICC-without erythema  Lab Results:  Lab Results  Component Value Date   WBC 4.6 06/06/2013   HGB 9.2* 06/06/2013   HCT 28.9* 06/06/2013   MCV 88.9 06/06/2013   PLT 155 06/06/2013   ANC 3.1    Medications: I have reviewed the patient's current medications.  Assessment/Plan: 1. Adenocarcinoma of the pancreas status post ultrasound-guided biopsy of a liver lesion 10/18/2012 consistent with metastatic pancreas cancer. CT 10/11/2012 revealed a pancreas tail mass and liver metastases. CA 19-9 in normal range on 10/24/2012. Initiation of gemcitabine/Abraxane on a day 1, day 8 day 15 schedule on 10/28/2012. Current schedule is day one/day 8.  Restaging CT 01/26/2013 revealed a decrease in the size of liver metastases and no new lesions  Gemcitabine/Abraxane continued on a day 1, day 8 schedule.  Restaging CT 04/25/2013 with a decrease in the size of the pancreas mass and liver lesions.  Gemcitabine/Abraxane continued on a  day 1, day 8 schedule. 2. Abdominal pain secondary to the pancreas mass. Improved. 3. Anorexia/weight loss. Improved. 4. Diabetes. Exacerbated by steroid premedication. Decadron was reduced to 5 mg after cycle 1 day 1. 5. History of coronary artery disease. 6. Headache following cycle 1 day 1 gemcitabine/Abraxane. Question related to Zofran.  7. History of thrombocytopenia secondary to chemotherapy. 8. Mild to moderate loss of vibratory sense in the fingertips. Question secondary to Abraxane versus diabetes. Not interfering with activity at present. 9. Depression-he declined an antidepressant   Disposition:  He continues gemcitabine/Abraxane on a day 1/day 8 schedule. He appears to have subclinical peripheral neuropathy. We continue to monitor him for progressive neuropathy.  I discussed the depression symptoms with him. He declined an antidepressant. I suggested he discuss this further with Dr. Pete Glatter.  His overall performance status appears stable.  He will be treated with gemcitabine/Abraxane today and again on 06/13/2013. He will return for an office visit 06/28/2013.   Thornton Papas, MD  06/06/2013  5:49 PM

## 2013-06-06 NOTE — Telephone Encounter (Signed)
Per staff message and POF I have scheduled appts.  JMW  

## 2013-06-13 ENCOUNTER — Ambulatory Visit (HOSPITAL_BASED_OUTPATIENT_CLINIC_OR_DEPARTMENT_OTHER): Payer: Medicare Other

## 2013-06-13 ENCOUNTER — Other Ambulatory Visit (HOSPITAL_BASED_OUTPATIENT_CLINIC_OR_DEPARTMENT_OTHER): Payer: Medicare Other

## 2013-06-13 VITALS — BP 128/77 | HR 79 | Temp 98.7°F | Resp 20

## 2013-06-13 DIAGNOSIS — C252 Malignant neoplasm of tail of pancreas: Secondary | ICD-10-CM

## 2013-06-13 DIAGNOSIS — C787 Secondary malignant neoplasm of liver and intrahepatic bile duct: Secondary | ICD-10-CM

## 2013-06-13 DIAGNOSIS — C259 Malignant neoplasm of pancreas, unspecified: Secondary | ICD-10-CM

## 2013-06-13 DIAGNOSIS — Z5111 Encounter for antineoplastic chemotherapy: Secondary | ICD-10-CM

## 2013-06-13 LAB — CBC WITH DIFFERENTIAL/PLATELET
BASO%: 0.7 % (ref 0.0–2.0)
EOS%: 2.1 % (ref 0.0–7.0)
MCH: 28.7 pg (ref 27.2–33.4)
MCHC: 32.6 g/dL (ref 32.0–36.0)
MONO#: 0.3 10*3/uL (ref 0.1–0.9)
MONO%: 9.5 % (ref 0.0–14.0)
NEUT%: 58 % (ref 39.0–75.0)
RBC: 3.27 10*6/uL — ABNORMAL LOW (ref 4.20–5.82)
WBC: 2.8 10*3/uL — ABNORMAL LOW (ref 4.0–10.3)
lymph#: 0.8 10*3/uL — ABNORMAL LOW (ref 0.9–3.3)

## 2013-06-13 LAB — TECHNOLOGIST REVIEW

## 2013-06-13 MED ORDER — SODIUM CHLORIDE 0.9 % IJ SOLN
10.0000 mL | INTRAMUSCULAR | Status: DC | PRN
  Administered 2013-06-13: 10 mL
  Filled 2013-06-13: qty 10

## 2013-06-13 MED ORDER — SODIUM CHLORIDE 0.9 % IV SOLN
560.0000 mg/m2 | Freq: Once | INTRAVENOUS | Status: AC
  Administered 2013-06-13: 1254 mg via INTRAVENOUS
  Filled 2013-06-13: qty 32.98

## 2013-06-13 MED ORDER — DEXAMETHASONE SODIUM PHOSPHATE 10 MG/ML IJ SOLN
INTRAMUSCULAR | Status: AC
  Filled 2013-06-13: qty 1

## 2013-06-13 MED ORDER — PACLITAXEL PROTEIN-BOUND CHEMO INJECTION 100 MG
80.0000 mg/m2 | Freq: Once | INTRAVENOUS | Status: AC
  Administered 2013-06-13: 175 mg via INTRAVENOUS
  Filled 2013-06-13: qty 35

## 2013-06-13 MED ORDER — SODIUM CHLORIDE 0.9 % IV SOLN
Freq: Once | INTRAVENOUS | Status: AC
  Administered 2013-06-13: 10:00:00 via INTRAVENOUS

## 2013-06-13 MED ORDER — ONDANSETRON 8 MG/NS 50 ML IVPB
INTRAVENOUS | Status: AC
  Filled 2013-06-13: qty 8

## 2013-06-13 MED ORDER — ONDANSETRON 8 MG/50ML IVPB (CHCC)
8.0000 mg | Freq: Once | INTRAVENOUS | Status: AC
  Administered 2013-06-13: 8 mg via INTRAVENOUS

## 2013-06-13 MED ORDER — DEXAMETHASONE SODIUM PHOSPHATE 10 MG/ML IJ SOLN
5.0000 mg | Freq: Once | INTRAMUSCULAR | Status: AC
  Administered 2013-06-13: 5 mg via INTRAVENOUS

## 2013-06-13 MED ORDER — HEPARIN SOD (PORK) LOCK FLUSH 100 UNIT/ML IV SOLN
500.0000 [IU] | Freq: Once | INTRAVENOUS | Status: AC | PRN
  Administered 2013-06-13: 500 [IU]
  Filled 2013-06-13: qty 5

## 2013-06-13 NOTE — Patient Instructions (Signed)
[facility] Discharge Instructions for Patients Receiving Chemotherapy  Today you received the following chemotherapy agents Abraxane/Gemzar.   To help prevent nausea and vomiting after your treatment, we encourage you to take your nausea medication as directed.    If you develop nausea and vomiting that is not controlled by your nausea medication, call the clinic.   BELOW ARE SYMPTOMS THAT SHOULD BE REPORTED IMMEDIATELY:  *FEVER GREATER THAN 100.5 F  *CHILLS WITH OR WITHOUT FEVER  NAUSEA AND VOMITING THAT IS NOT CONTROLLED WITH YOUR NAUSEA MEDICATION  *UNUSUAL SHORTNESS OF BREATH  *UNUSUAL BRUISING OR BLEEDING  TENDERNESS IN MOUTH AND THROAT WITH OR WITHOUT PRESENCE OF ULCERS  *URINARY PROBLEMS  *BOWEL PROBLEMS  UNUSUAL RASH Items with * indicate a potential emergency and should be followed up as soon as possible.  Feel free to call the clinic you have any questions or concerns. The clinic phone number is [phone].    

## 2013-06-13 NOTE — Progress Notes (Signed)
0930-OK to treat with with WBC-2.8 and ANC-1.6 per Dr. Truett Perna.

## 2013-06-14 ENCOUNTER — Other Ambulatory Visit: Payer: Medicare Other

## 2013-06-14 ENCOUNTER — Ambulatory Visit: Payer: Medicare Other

## 2013-06-21 ENCOUNTER — Encounter: Payer: Self-pay | Admitting: Oncology

## 2013-06-21 NOTE — Progress Notes (Signed)
Sending copy of bill for injection to billing to get sent for pmt--chronic decease fund.

## 2013-06-25 ENCOUNTER — Other Ambulatory Visit: Payer: Self-pay | Admitting: Oncology

## 2013-06-28 ENCOUNTER — Other Ambulatory Visit (HOSPITAL_BASED_OUTPATIENT_CLINIC_OR_DEPARTMENT_OTHER): Payer: Medicare Other

## 2013-06-28 ENCOUNTER — Ambulatory Visit (HOSPITAL_BASED_OUTPATIENT_CLINIC_OR_DEPARTMENT_OTHER): Payer: Medicare Other | Admitting: Oncology

## 2013-06-28 ENCOUNTER — Ambulatory Visit (HOSPITAL_BASED_OUTPATIENT_CLINIC_OR_DEPARTMENT_OTHER): Payer: Medicare Other

## 2013-06-28 ENCOUNTER — Telehealth: Payer: Self-pay | Admitting: Oncology

## 2013-06-28 VITALS — BP 130/75 | HR 81 | Temp 97.4°F | Resp 18 | Ht 73.0 in | Wt 220.7 lb

## 2013-06-28 DIAGNOSIS — Z5111 Encounter for antineoplastic chemotherapy: Secondary | ICD-10-CM

## 2013-06-28 DIAGNOSIS — F329 Major depressive disorder, single episode, unspecified: Secondary | ICD-10-CM

## 2013-06-28 DIAGNOSIS — F3289 Other specified depressive episodes: Secondary | ICD-10-CM

## 2013-06-28 DIAGNOSIS — C252 Malignant neoplasm of tail of pancreas: Secondary | ICD-10-CM

## 2013-06-28 DIAGNOSIS — C259 Malignant neoplasm of pancreas, unspecified: Secondary | ICD-10-CM

## 2013-06-28 DIAGNOSIS — E119 Type 2 diabetes mellitus without complications: Secondary | ICD-10-CM

## 2013-06-28 DIAGNOSIS — C787 Secondary malignant neoplasm of liver and intrahepatic bile duct: Secondary | ICD-10-CM

## 2013-06-28 LAB — COMPREHENSIVE METABOLIC PANEL (CC13)
ALBUMIN: 3.6 g/dL (ref 3.5–5.0)
ALK PHOS: 50 U/L (ref 40–150)
ALT: 15 U/L (ref 0–55)
AST: 14 U/L (ref 5–34)
Anion Gap: 10 mEq/L (ref 3–11)
BUN: 9.8 mg/dL (ref 7.0–26.0)
CO2: 23 meq/L (ref 22–29)
Calcium: 8.9 mg/dL (ref 8.4–10.4)
Chloride: 109 mEq/L (ref 98–109)
Creatinine: 1.1 mg/dL (ref 0.7–1.3)
Glucose: 173 mg/dl — ABNORMAL HIGH (ref 70–140)
POTASSIUM: 4.4 meq/L (ref 3.5–5.1)
SODIUM: 143 meq/L (ref 136–145)
TOTAL PROTEIN: 6.4 g/dL (ref 6.4–8.3)
Total Bilirubin: 0.49 mg/dL (ref 0.20–1.20)

## 2013-06-28 LAB — CBC WITH DIFFERENTIAL/PLATELET
BASO%: 0.8 % (ref 0.0–2.0)
Basophils Absolute: 0 10*3/uL (ref 0.0–0.1)
EOS ABS: 0.1 10*3/uL (ref 0.0–0.5)
EOS%: 2.8 % (ref 0.0–7.0)
HCT: 30.1 % — ABNORMAL LOW (ref 38.4–49.9)
HGB: 9.5 g/dL — ABNORMAL LOW (ref 13.0–17.1)
LYMPH%: 20.6 % (ref 14.0–49.0)
MCH: 28.1 pg (ref 27.2–33.4)
MCHC: 31.6 g/dL — ABNORMAL LOW (ref 32.0–36.0)
MCV: 89.1 fL (ref 79.3–98.0)
MONO#: 0.4 10*3/uL (ref 0.1–0.9)
MONO%: 10.1 % (ref 0.0–14.0)
NEUT#: 2.6 10*3/uL (ref 1.5–6.5)
NEUT%: 65.7 % (ref 39.0–75.0)
Platelets: 172 10*3/uL (ref 140–400)
RBC: 3.38 10*6/uL — AB (ref 4.20–5.82)
RDW: 17.3 % — AB (ref 11.0–14.6)
WBC: 3.9 10*3/uL — ABNORMAL LOW (ref 4.0–10.3)
lymph#: 0.8 10*3/uL — ABNORMAL LOW (ref 0.9–3.3)

## 2013-06-28 MED ORDER — DEXAMETHASONE SODIUM PHOSPHATE 10 MG/ML IJ SOLN
INTRAMUSCULAR | Status: AC
  Filled 2013-06-28: qty 1

## 2013-06-28 MED ORDER — ONDANSETRON 8 MG/NS 50 ML IVPB
INTRAVENOUS | Status: AC
  Filled 2013-06-28: qty 8

## 2013-06-28 MED ORDER — SODIUM CHLORIDE 0.9 % IV SOLN
560.0000 mg/m2 | Freq: Once | INTRAVENOUS | Status: AC
  Administered 2013-06-28: 1254 mg via INTRAVENOUS
  Filled 2013-06-28: qty 32.98

## 2013-06-28 MED ORDER — SODIUM CHLORIDE 0.9 % IV SOLN
Freq: Once | INTRAVENOUS | Status: AC
  Administered 2013-06-28: 11:00:00 via INTRAVENOUS

## 2013-06-28 MED ORDER — DEXAMETHASONE SODIUM PHOSPHATE 10 MG/ML IJ SOLN
5.0000 mg | Freq: Once | INTRAMUSCULAR | Status: AC
Start: 2013-06-28 — End: 2013-06-28
  Administered 2013-06-28: 5 mg via INTRAVENOUS

## 2013-06-28 MED ORDER — ONDANSETRON 8 MG/50ML IVPB (CHCC)
8.0000 mg | Freq: Once | INTRAVENOUS | Status: AC
  Administered 2013-06-28: 8 mg via INTRAVENOUS

## 2013-06-28 MED ORDER — HEPARIN SOD (PORK) LOCK FLUSH 100 UNIT/ML IV SOLN
500.0000 [IU] | Freq: Once | INTRAVENOUS | Status: AC | PRN
  Administered 2013-06-28: 500 [IU]
  Filled 2013-06-28: qty 5

## 2013-06-28 MED ORDER — SODIUM CHLORIDE 0.9 % IJ SOLN
10.0000 mL | INTRAMUSCULAR | Status: DC | PRN
Start: 2013-06-28 — End: 2013-06-28
  Administered 2013-06-28: 10 mL
  Filled 2013-06-28: qty 10

## 2013-06-28 MED ORDER — PACLITAXEL PROTEIN-BOUND CHEMO INJECTION 100 MG
80.0000 mg/m2 | Freq: Once | INTRAVENOUS | Status: AC
  Administered 2013-06-28: 175 mg via INTRAVENOUS
  Filled 2013-06-28: qty 35

## 2013-06-28 NOTE — Telephone Encounter (Signed)
gv and printed appt sched and avs for pt for Jan adn Feb...sed added tx.   °

## 2013-06-28 NOTE — Progress Notes (Signed)
   [facility]    OFFICE PROGRESS NOTE   INTERVAL HISTORY:   He returns for scheduled followup with metastatic pancreas cancer. He completed another cycle of gemcitabine/Abraxane beginning 06/06/2013. No pain or neuropathy symptoms. Mild exertional dyspnea.he traveled to [place] for the holidays.  Objective:  Vital signs in last 24 hours:  Blood pressure 130/75, pulse 81, temperature 97.4 F (36.3 C), temperature source Oral, resp. rate 18, height 6\' 1"  (1.854 m), weight 220 lb 11.2 oz (100.109 kg), SpO2 100.00%.    HEENT: no thrush or ulcers Resp: lungs clear bilaterally Cardio: regular rate and rhythm GI: no hepatomegaly, no mass, nontender Vascular: no leg edema Neurologic: Mild decrease in vibratory sense at the fingertips bilaterally  Portacath/PICC-without erythema  Lab Results:  Lab Results  Component Value Date   WBC 3.9* 06/28/2013   HGB 9.5* 06/28/2013   HCT 30.1* 06/28/2013   MCV 89.1 06/28/2013   PLT 172 06/28/2013   NEUTROABS 2.6 06/28/2013      Medications: I have reviewed the patient's current medications.  Assessment/Plan: 1. Adenocarcinoma of the pancreas status post ultrasound-guided biopsy of a liver lesion 10/18/2012 consistent with metastatic pancreas cancer. CT 10/11/2012 revealed a pancreas tail mass and liver metastases. CA 19-9 in normal range on 10/24/2012. Initiation of gemcitabine/Abraxane on a day 1, day 8 day 15 schedule on 10/28/2012. Current schedule is day one/day 8.  Restaging CT 01/26/2013 revealed a decrease in the size of liver metastases and no new lesions  Gemcitabine/Abraxane continued on a day 1, day 8 schedule.  Restaging CT 04/25/2013 with a decrease in the size of the pancreas mass and liver lesions.  Gemcitabine/Abraxane continued on a day 1, day 8 schedule. 2. Abdominal pain secondary to the pancreas mass. Improved. 3. Anorexia/weight loss. Improved. 4. Diabetes. Exacerbated by steroid premedication.  Decadron was reduced to 5 mg after cycle 1 day 1. 5. History of coronary artery disease. 6. Headache following cycle 1 day 1 gemcitabine/Abraxane. Question related to Zofran.  7. History of thrombocytopenia secondary to chemotherapy. 8. Mild to moderate loss of vibratory sense in the fingertips. Question secondary to Abraxane versus diabetes. Not interfering with activity at present. 9. Depression-he declined an antidepressant  Disposition:  Mr. Petite appears stable. The plan is to continue gemcitabine/Abraxane on a day one/day 8 schedule. He will be referred for a restaging CT evaluation after the cycle of chemotherapy which begins 07/19/2013.   [name], MD  06/28/2013  2:53 PM

## 2013-06-28 NOTE — Patient Instructions (Signed)
[facility] Discharge Instructions for Patients Receiving Chemotherapy  Today you received the following chemotherapy agents Gemzar and Abraxane.  To help prevent nausea and vomiting after your treatment, we encourage you to take your nausea medication as prescribed.   If you develop nausea and vomiting that is not controlled by your nausea medication, call the clinic.   BELOW ARE SYMPTOMS THAT SHOULD BE REPORTED IMMEDIATELY:  *FEVER GREATER THAN 100.5 F  *CHILLS WITH OR WITHOUT FEVER  NAUSEA AND VOMITING THAT IS NOT CONTROLLED WITH YOUR NAUSEA MEDICATION  *UNUSUAL SHORTNESS OF BREATH  *UNUSUAL BRUISING OR BLEEDING  TENDERNESS IN MOUTH AND THROAT WITH OR WITHOUT PRESENCE OF ULCERS  *URINARY PROBLEMS  *BOWEL PROBLEMS  UNUSUAL RASH Items with * indicate a potential emergency and should be followed up as soon as possible.  Feel free to call the clinic you have any questions or concerns. The clinic phone number is [phone].    

## 2013-07-03 ENCOUNTER — Other Ambulatory Visit: Payer: Self-pay | Admitting: *Deleted

## 2013-07-03 MED ORDER — ISOSORBIDE MONONITRATE ER 60 MG PO TB24: 60.0000 mg | ORAL_TABLET | Freq: Every morning | ORAL | Status: AC

## 2013-07-05 ENCOUNTER — Other Ambulatory Visit (HOSPITAL_BASED_OUTPATIENT_CLINIC_OR_DEPARTMENT_OTHER): Payer: Medicare Other

## 2013-07-05 ENCOUNTER — Ambulatory Visit (HOSPITAL_BASED_OUTPATIENT_CLINIC_OR_DEPARTMENT_OTHER): Payer: Medicare Other

## 2013-07-05 VITALS — BP 128/71 | HR 68 | Temp 98.7°F | Resp 18

## 2013-07-05 DIAGNOSIS — C259 Malignant neoplasm of pancreas, unspecified: Secondary | ICD-10-CM

## 2013-07-05 DIAGNOSIS — C787 Secondary malignant neoplasm of liver and intrahepatic bile duct: Secondary | ICD-10-CM

## 2013-07-05 DIAGNOSIS — C252 Malignant neoplasm of tail of pancreas: Secondary | ICD-10-CM

## 2013-07-05 DIAGNOSIS — Z5111 Encounter for antineoplastic chemotherapy: Secondary | ICD-10-CM

## 2013-07-05 LAB — CBC WITH DIFFERENTIAL/PLATELET
BASO%: 2.5 % — ABNORMAL HIGH (ref 0.0–2.0)
BASOS ABS: 0.1 10*3/uL (ref 0.0–0.1)
EOS ABS: 0.1 10*3/uL (ref 0.0–0.5)
EOS%: 1.8 % (ref 0.0–7.0)
HCT: 31.9 % — ABNORMAL LOW (ref 38.4–49.9)
HEMOGLOBIN: 10.4 g/dL — AB (ref 13.0–17.1)
LYMPH%: 33.8 % (ref 14.0–49.0)
MCH: 28.5 pg (ref 27.2–33.4)
MCHC: 32.6 g/dL (ref 32.0–36.0)
MCV: 87.4 fL (ref 79.3–98.0)
MONO#: 0.3 10*3/uL (ref 0.1–0.9)
MONO%: 10.6 % (ref 0.0–14.0)
NEUT%: 51.3 % (ref 39.0–75.0)
NEUTROS ABS: 1.5 10*3/uL (ref 1.5–6.5)
Platelets: 186 10*3/uL (ref 140–400)
RBC: 3.65 10*6/uL — ABNORMAL LOW (ref 4.20–5.82)
RDW: 16.8 % — ABNORMAL HIGH (ref 11.0–14.6)
WBC: 2.8 10*3/uL — ABNORMAL LOW (ref 4.0–10.3)
lymph#: 1 10*3/uL (ref 0.9–3.3)
nRBC: 1 % — ABNORMAL HIGH (ref 0–0)

## 2013-07-05 LAB — TECHNOLOGIST REVIEW

## 2013-07-05 MED ORDER — DEXAMETHASONE SODIUM PHOSPHATE 10 MG/ML IJ SOLN
INTRAMUSCULAR | Status: AC
  Filled 2013-07-05: qty 1

## 2013-07-05 MED ORDER — SODIUM CHLORIDE 0.9 % IV SOLN
560.0000 mg/m2 | Freq: Once | INTRAVENOUS | Status: AC
  Administered 2013-07-05: 1254 mg via INTRAVENOUS
  Filled 2013-07-05: qty 32.98

## 2013-07-05 MED ORDER — SODIUM CHLORIDE 0.9 % IJ SOLN
10.0000 mL | INTRAMUSCULAR | Status: DC | PRN
  Administered 2013-07-05: 10 mL
  Filled 2013-07-05: qty 10

## 2013-07-05 MED ORDER — ONDANSETRON 8 MG/NS 50 ML IVPB
INTRAVENOUS | Status: AC
  Filled 2013-07-05: qty 8

## 2013-07-05 MED ORDER — SODIUM CHLORIDE 0.9 % IV SOLN
Freq: Once | INTRAVENOUS | Status: AC
  Administered 2013-07-05: 10:00:00 via INTRAVENOUS

## 2013-07-05 MED ORDER — PACLITAXEL PROTEIN-BOUND CHEMO INJECTION 100 MG
80.0000 mg/m2 | Freq: Once | INTRAVENOUS | Status: AC
  Administered 2013-07-05: 175 mg via INTRAVENOUS
  Filled 2013-07-05: qty 35

## 2013-07-05 MED ORDER — HEPARIN SOD (PORK) LOCK FLUSH 100 UNIT/ML IV SOLN
500.0000 [IU] | Freq: Once | INTRAVENOUS | Status: AC | PRN
  Administered 2013-07-05: 500 [IU]
  Filled 2013-07-05: qty 5

## 2013-07-05 MED ORDER — ONDANSETRON 8 MG/50ML IVPB (CHCC)
8.0000 mg | Freq: Once | INTRAVENOUS | Status: AC
  Administered 2013-07-05: 8 mg via INTRAVENOUS

## 2013-07-05 MED ORDER — DEXAMETHASONE SODIUM PHOSPHATE 10 MG/ML IJ SOLN
5.0000 mg | Freq: Once | INTRAMUSCULAR | Status: AC
Start: 2013-07-05 — End: 2013-07-05
  Administered 2013-07-05: 5 mg via INTRAVENOUS

## 2013-07-05 NOTE — Patient Instructions (Signed)
[place] Discharge Instructions for Patients Receiving Chemotherapy  Today you received the following chemotherapy agents Abraxane, Gemzar.  To help prevent nausea and vomiting after your treatment, we encourage you to take your nausea medication as prescribed,.    If you develop nausea and vomiting that is not controlled by your nausea medication, call the clinic.   BELOW ARE SYMPTOMS THAT SHOULD BE REPORTED IMMEDIATELY:  *FEVER GREATER THAN 100.5 F  *CHILLS WITH OR WITHOUT FEVER  NAUSEA AND VOMITING THAT IS NOT CONTROLLED WITH YOUR NAUSEA MEDICATION  *UNUSUAL SHORTNESS OF BREATH  *UNUSUAL BRUISING OR BLEEDING  TENDERNESS IN MOUTH AND THROAT WITH OR WITHOUT PRESENCE OF ULCERS  *URINARY PROBLEMS  *BOWEL PROBLEMS  UNUSUAL RASH Items with * indicate a potential emergency and should be followed up as soon as possible.  Feel free to call the clinic should you have any questions or concerns. The clinic phone number is [phone].  It was my pleasure to take care of you today!  Leeanne Rio, RN

## 2013-07-05 NOTE — Progress Notes (Signed)
Per Dr. Benay Spice, ok to treat with WBC 2.8 and ANC 1.5.

## 2013-07-16 ENCOUNTER — Other Ambulatory Visit: Payer: Self-pay | Admitting: Oncology

## 2013-07-19 ENCOUNTER — Other Ambulatory Visit (HOSPITAL_BASED_OUTPATIENT_CLINIC_OR_DEPARTMENT_OTHER): Payer: Medicare Other

## 2013-07-19 ENCOUNTER — Telehealth: Payer: Self-pay | Admitting: *Deleted

## 2013-07-19 ENCOUNTER — Ambulatory Visit (HOSPITAL_BASED_OUTPATIENT_CLINIC_OR_DEPARTMENT_OTHER): Payer: Medicare Other

## 2013-07-19 ENCOUNTER — Ambulatory Visit (HOSPITAL_COMMUNITY)
Admission: RE | Admit: 2013-07-19 | Discharge: 2013-07-19 | Disposition: A | Discharge status: Home / Self Care | Payer: Medicare Other | Source: Ambulatory Visit | Attending: Oncology | Admitting: Oncology

## 2013-07-19 ENCOUNTER — Ambulatory Visit (HOSPITAL_BASED_OUTPATIENT_CLINIC_OR_DEPARTMENT_OTHER): Payer: Medicare Other | Admitting: Nurse Practitioner

## 2013-07-19 ENCOUNTER — Telehealth: Payer: Self-pay | Admitting: Oncology

## 2013-07-19 VITALS — BP 147/86 | HR 78 | Temp 96.9°F | Ht 73.0 in | Wt 222.0 lb

## 2013-07-19 DIAGNOSIS — C252 Malignant neoplasm of tail of pancreas: Secondary | ICD-10-CM

## 2013-07-19 DIAGNOSIS — C259 Malignant neoplasm of pancreas, unspecified: Secondary | ICD-10-CM

## 2013-07-19 DIAGNOSIS — M7989 Other specified soft tissue disorders: Secondary | ICD-10-CM | POA: Insufficient documentation

## 2013-07-19 DIAGNOSIS — C787 Secondary malignant neoplasm of liver and intrahepatic bile duct: Secondary | ICD-10-CM

## 2013-07-19 DIAGNOSIS — R609 Edema, unspecified: Secondary | ICD-10-CM

## 2013-07-19 DIAGNOSIS — D696 Thrombocytopenia, unspecified: Secondary | ICD-10-CM | POA: Insufficient documentation

## 2013-07-19 DIAGNOSIS — Z5111 Encounter for antineoplastic chemotherapy: Secondary | ICD-10-CM

## 2013-07-19 DIAGNOSIS — R109 Unspecified abdominal pain: Secondary | ICD-10-CM

## 2013-07-19 LAB — CBC WITH DIFFERENTIAL/PLATELET
BASO%: 0.8 % (ref 0.0–2.0)
Basophils Absolute: 0 10*3/uL (ref 0.0–0.1)
EOS ABS: 0.1 10*3/uL (ref 0.0–0.5)
EOS%: 2.8 % (ref 0.0–7.0)
HCT: 30.5 % — ABNORMAL LOW (ref 38.4–49.9)
HGB: 9.7 g/dL — ABNORMAL LOW (ref 13.0–17.1)
LYMPH%: 20.4 % (ref 14.0–49.0)
MCH: 28.6 pg (ref 27.2–33.4)
MCHC: 31.8 g/dL — AB (ref 32.0–36.0)
MCV: 90 fL (ref 79.3–98.0)
MONO#: 0.4 10*3/uL (ref 0.1–0.9)
MONO%: 9.9 % (ref 0.0–14.0)
NEUT#: 2.6 10*3/uL (ref 1.5–6.5)
NEUT%: 66.1 % (ref 39.0–75.0)
PLATELETS: 140 10*3/uL (ref 140–400)
RBC: 3.39 10*6/uL — AB (ref 4.20–5.82)
RDW: 17.7 % — ABNORMAL HIGH (ref 11.0–14.6)
WBC: 3.9 10*3/uL — ABNORMAL LOW (ref 4.0–10.3)
lymph#: 0.8 10*3/uL — ABNORMAL LOW (ref 0.9–3.3)

## 2013-07-19 LAB — COMPREHENSIVE METABOLIC PANEL (CC13)
ALBUMIN: 3.6 g/dL (ref 3.5–5.0)
ALT: 16 U/L (ref 0–55)
AST: 16 U/L (ref 5–34)
Alkaline Phosphatase: 44 U/L (ref 40–150)
Anion Gap: 7 mEq/L (ref 3–11)
BUN: 9.7 mg/dL (ref 7.0–26.0)
CO2: 25 mEq/L (ref 22–29)
Calcium: 9 mg/dL (ref 8.4–10.4)
Chloride: 110 mEq/L — ABNORMAL HIGH (ref 98–109)
Creatinine: 1 mg/dL (ref 0.7–1.3)
Glucose: 160 mg/dl — ABNORMAL HIGH (ref 70–140)
POTASSIUM: 4.1 meq/L (ref 3.5–5.1)
SODIUM: 142 meq/L (ref 136–145)
TOTAL PROTEIN: 6 g/dL — AB (ref 6.4–8.3)
Total Bilirubin: 0.46 mg/dL (ref 0.20–1.20)

## 2013-07-19 LAB — TECHNOLOGIST REVIEW

## 2013-07-19 MED ORDER — ONDANSETRON 8 MG/50ML IVPB (CHCC)
8.0000 mg | Freq: Once | INTRAVENOUS | Status: AC
  Administered 2013-07-19: 8 mg via INTRAVENOUS

## 2013-07-19 MED ORDER — ONDANSETRON 8 MG/NS 50 ML IVPB
INTRAVENOUS | Status: AC
  Filled 2013-07-19: qty 8

## 2013-07-19 MED ORDER — SODIUM CHLORIDE 0.9 % IV SOLN
560.0000 mg/m2 | Freq: Once | INTRAVENOUS | Status: AC
  Administered 2013-07-19: 1254 mg via INTRAVENOUS
  Filled 2013-07-19: qty 32.98

## 2013-07-19 MED ORDER — SODIUM CHLORIDE 0.9 % IJ SOLN
10.0000 mL | INTRAMUSCULAR | Status: DC | PRN
  Administered 2013-07-19: 10 mL
  Filled 2013-07-19: qty 10

## 2013-07-19 MED ORDER — DEXAMETHASONE SODIUM PHOSPHATE 10 MG/ML IJ SOLN
INTRAMUSCULAR | Status: AC
  Filled 2013-07-19: qty 1

## 2013-07-19 MED ORDER — PACLITAXEL PROTEIN-BOUND CHEMO INJECTION 100 MG
80.0000 mg/m2 | Freq: Once | INTRAVENOUS | Status: AC
  Administered 2013-07-19: 175 mg via INTRAVENOUS
  Filled 2013-07-19: qty 35

## 2013-07-19 MED ORDER — DEXAMETHASONE SODIUM PHOSPHATE 20 MG/5ML IJ SOLN
INTRAMUSCULAR | Status: AC
  Filled 2013-07-19: qty 5

## 2013-07-19 MED ORDER — SODIUM CHLORIDE 0.9 % IV SOLN
Freq: Once | INTRAVENOUS | Status: AC
  Administered 2013-07-19: 11:00:00 via INTRAVENOUS

## 2013-07-19 MED ORDER — HEPARIN SOD (PORK) LOCK FLUSH 100 UNIT/ML IV SOLN
500.0000 [IU] | Freq: Once | INTRAVENOUS | Status: AC | PRN
  Administered 2013-07-19: 500 [IU]
  Filled 2013-07-19: qty 5

## 2013-07-19 MED ORDER — DEXAMETHASONE SODIUM PHOSPHATE 10 MG/ML IJ SOLN
5.0000 mg | Freq: Once | INTRAMUSCULAR | Status: AC
  Administered 2013-07-19: 5 mg via INTRAVENOUS

## 2013-07-19 NOTE — Telephone Encounter (Signed)
Gave pt appt for doppler today@ [facility] 4:70JG no precert per Vaughan Basta, gave pt appt for lab and MD, emailed Sharyn Lull regarding chemo

## 2013-07-19 NOTE — Progress Notes (Signed)
Right lower extremity venous duplex completed.  Right:  No evidence of DVT, superficial thrombosis, or Baker's cyst.  Left:  Negative for DVT in the common femoral vein.  

## 2013-07-19 NOTE — Telephone Encounter (Signed)
Message from Bertram in vascular lab, pt is negative for DVT in R leg. Was discharged home. Ned Card, NP made aware.

## 2013-07-19 NOTE — Patient Instructions (Signed)
[facility] Discharge Instructions for Patients Receiving Chemotherapy  Today you received the following chemotherapy agents Abraxane/Gemzar  To help prevent nausea and vomiting after your treatment, we encourage you to take your nausea medication as prescribed.   If you develop nausea and vomiting that is not controlled by your nausea medication, call the clinic.   BELOW ARE SYMPTOMS THAT SHOULD BE REPORTED IMMEDIATELY:  *FEVER GREATER THAN 100.5 F  *CHILLS WITH OR WITHOUT FEVER  NAUSEA AND VOMITING THAT IS NOT CONTROLLED WITH YOUR NAUSEA MEDICATION  *UNUSUAL SHORTNESS OF BREATH  *UNUSUAL BRUISING OR BLEEDING  TENDERNESS IN MOUTH AND THROAT WITH OR WITHOUT PRESENCE OF ULCERS  *URINARY PROBLEMS  *BOWEL PROBLEMS  UNUSUAL RASH Items with * indicate a potential emergency and should be followed up as soon as possible.  Feel free to call the clinic you have any questions or concerns. The clinic phone number is [phone].    

## 2013-07-19 NOTE — Progress Notes (Signed)
OFFICE PROGRESS NOTE  Interval history:  Christian Kaiser returns for followup of metastatic pancreas cancer. He denies nausea/vomiting. No mouth sores. No diarrhea. He has noted a few acne-like skin lesions on his face. No numbness or tingling in his hands or feet. He continues to have a good appetite. He has had mild intermittent pain at the left "side" for the past week. The pain is relieved with Tylenol. He has no pain today.  On exam today he was noted to have swelling of the right leg. He reports a 2 week history of a "tight" sensation in the right calf especially notable when climbing stairs. No shortness of breath or chest pain.   Objective: Filed Vitals:   07/19/13 0922  BP: 147/86  Pulse: 78  Temp: 96.9 F (36.1 C)   Oropharynx is without thrush or ulceration. Lungs are clear. No wheezes or rales. Regular cardiac rhythm. Port-A-Cath site without erythema. Abdomen soft and nontender. No organomegaly. No mass. Right leg with pitting edema below the knee. No significant left leg edema. Mild to moderate decrease in vibratory sense over the fingertips bilaterally per tuning fork exam.   Lab Results: Lab Results  Component Value Date   WBC 3.9* 07/19/2013   HGB 9.7* 07/19/2013   HCT 30.5* 07/19/2013   MCV 90.0 07/19/2013   PLT 140 07/19/2013   NEUTROABS 2.6 07/19/2013    Chemistry:    Chemistry      Component Value Date/Time   NA 143 06/28/2013 0913   NA 135 11/28/2011 0515   K 4.4 06/28/2013 0913   K 4.0 11/28/2011 0515   CL 107 11/25/2012 0934   CL 101 11/28/2011 0515   CO2 23 06/28/2013 0913   CO2 22 11/28/2011 0515   BUN 9.8 06/28/2013 0913   BUN 26* 11/28/2011 0515   CREATININE 1.1 06/28/2013 0913   CREATININE 1.29 11/28/2011 0515      Component Value Date/Time   CALCIUM 8.9 06/28/2013 0913   CALCIUM 9.2 11/28/2011 0515   ALKPHOS 50 06/28/2013 0913   ALKPHOS 43 11/27/2011 2229   AST 14 06/28/2013 0913   AST 14 11/27/2011 2229   ALT 15 06/28/2013 0913   ALT 13 11/27/2011 2229   BILITOT 0.49  06/28/2013 0913   BILITOT 0.2* 11/27/2011 2229       Studies/Results: No results found.  Medications: I have reviewed the patient's current medications.  Assessment/Plan: 1. Adenocarcinoma of the pancreas status post ultrasound-guided biopsy of a liver lesion 10/18/2012 consistent with metastatic pancreas cancer. CT 10/11/2012 revealed a pancreas tail mass and liver metastases. CA 19-9 in normal range on 10/24/2012. Initiation of gemcitabine/Abraxane on a day 1, day 8 day 15 schedule on 10/28/2012. Current schedule is day one/day 8.  Restaging CT 01/26/2013 revealed a decrease in the size of liver metastases and no new lesions  Gemcitabine/Abraxane continued on a day 1, day 8 schedule.  Restaging CT 04/25/2013 with a decrease in the size of the pancreas mass and liver lesions.  Gemcitabine/Abraxane continued on a day 1, day 8 schedule. 2. Abdominal pain secondary to the pancreas mass. Improved. 3. Anorexia/weight loss. Improved. 4. Diabetes. Exacerbated by steroid premedication. Decadron was reduced to 5 mg after cycle 1 day 1. 5. History of coronary artery disease. 6. Headache following cycle 1 day 1 gemcitabine/Abraxane. Question related to Zofran.  7. History of thrombocytopenia secondary to chemotherapy. 8. Mild to moderate loss of vibratory sense in the fingertips. Question secondary to Abraxane versus diabetes. Not interfering with activity  at present. 9. Depression-he declined an antidepressant. 10. Right leg edema. Question DVT.   Dispositon-he appears stable. Plan to proceed with the next cycle of gemcitabine/Abraxane today as scheduled. Restaging CT evaluation is planned after this cycle.  He has new edema of the right leg. We are referring him for a venous Doppler.  He will return for a followup visit on 08/09/2013. If the venous Doppler is positive for a DVT we will see him back in the office this afternoon and initiate anticoagulation.   Plan reviewed with Dr.  Benay Spice.   Ned Card ANP/GNP-BC

## 2013-07-20 ENCOUNTER — Telehealth: Payer: Self-pay | Admitting: *Deleted

## 2013-07-20 NOTE — Telephone Encounter (Signed)
Per staff message and POF I have scheduled appts.  JMW  

## 2013-07-24 ENCOUNTER — Telehealth: Payer: Self-pay | Admitting: Oncology

## 2013-07-24 NOTE — Telephone Encounter (Signed)
Called pt , left m,essage for Wednesday appt lab and chemo

## 2013-07-26 ENCOUNTER — Other Ambulatory Visit (HOSPITAL_BASED_OUTPATIENT_CLINIC_OR_DEPARTMENT_OTHER): Payer: Medicare Other

## 2013-07-26 ENCOUNTER — Ambulatory Visit (HOSPITAL_BASED_OUTPATIENT_CLINIC_OR_DEPARTMENT_OTHER): Payer: Medicare Other

## 2013-07-26 VITALS — BP 157/77 | HR 71 | Temp 98.1°F | Resp 18

## 2013-07-26 DIAGNOSIS — C252 Malignant neoplasm of tail of pancreas: Secondary | ICD-10-CM

## 2013-07-26 DIAGNOSIS — C259 Malignant neoplasm of pancreas, unspecified: Secondary | ICD-10-CM

## 2013-07-26 DIAGNOSIS — C787 Secondary malignant neoplasm of liver and intrahepatic bile duct: Secondary | ICD-10-CM

## 2013-07-26 DIAGNOSIS — Z5111 Encounter for antineoplastic chemotherapy: Secondary | ICD-10-CM

## 2013-07-26 LAB — CBC WITH DIFFERENTIAL/PLATELET
BASO%: 1.4 % (ref 0.0–2.0)
Basophils Absolute: 0 10*3/uL (ref 0.0–0.1)
EOS ABS: 0 10*3/uL (ref 0.0–0.5)
EOS%: 1.8 % (ref 0.0–7.0)
HCT: 29.5 % — ABNORMAL LOW (ref 38.4–49.9)
HGB: 9.4 g/dL — ABNORMAL LOW (ref 13.0–17.1)
LYMPH#: 0.7 10*3/uL — AB (ref 0.9–3.3)
LYMPH%: 31.8 % (ref 14.0–49.0)
MCH: 28.8 pg (ref 27.2–33.4)
MCHC: 31.9 g/dL — AB (ref 32.0–36.0)
MCV: 90.5 fL (ref 79.3–98.0)
MONO#: 0.2 10*3/uL (ref 0.1–0.9)
MONO%: 10.6 % (ref 0.0–14.0)
NEUT%: 54.4 % (ref 39.0–75.0)
NEUTROS ABS: 1.2 10*3/uL — AB (ref 1.5–6.5)
NRBC: 1 % — AB (ref 0–0)
PLATELETS: 140 10*3/uL (ref 140–400)
RBC: 3.26 10*6/uL — ABNORMAL LOW (ref 4.20–5.82)
RDW: 17.1 % — ABNORMAL HIGH (ref 11.0–14.6)
WBC: 2.2 10*3/uL — AB (ref 4.0–10.3)

## 2013-07-26 MED ORDER — SODIUM CHLORIDE 0.9 % IV SOLN
560.0000 mg/m2 | Freq: Once | INTRAVENOUS | Status: AC
  Administered 2013-07-26: 1254 mg via INTRAVENOUS
  Filled 2013-07-26: qty 32.98

## 2013-07-26 MED ORDER — ONDANSETRON 8 MG/50ML IVPB (CHCC)
8.0000 mg | Freq: Once | INTRAVENOUS | Status: AC
  Administered 2013-07-26: 8 mg via INTRAVENOUS

## 2013-07-26 MED ORDER — SODIUM CHLORIDE 0.9 % IV SOLN
Freq: Once | INTRAVENOUS | Status: AC
  Administered 2013-07-26: 10:00:00 via INTRAVENOUS

## 2013-07-26 MED ORDER — ONDANSETRON 8 MG/NS 50 ML IVPB
INTRAVENOUS | Status: AC
  Filled 2013-07-26: qty 8

## 2013-07-26 MED ORDER — HEPARIN SOD (PORK) LOCK FLUSH 100 UNIT/ML IV SOLN
500.0000 [IU] | Freq: Once | INTRAVENOUS | Status: AC | PRN
  Administered 2013-07-26: 500 [IU]
  Filled 2013-07-26: qty 5

## 2013-07-26 MED ORDER — DEXAMETHASONE SODIUM PHOSPHATE 10 MG/ML IJ SOLN
INTRAMUSCULAR | Status: AC
  Filled 2013-07-26: qty 1

## 2013-07-26 MED ORDER — DEXAMETHASONE SODIUM PHOSPHATE 10 MG/ML IJ SOLN
5.0000 mg | Freq: Once | INTRAMUSCULAR | Status: AC
  Administered 2013-07-26: 5 mg via INTRAVENOUS

## 2013-07-26 MED ORDER — PACLITAXEL PROTEIN-BOUND CHEMO INJECTION 100 MG
80.0000 mg/m2 | Freq: Once | INTRAVENOUS | Status: AC
  Administered 2013-07-26: 175 mg via INTRAVENOUS
  Filled 2013-07-26: qty 35

## 2013-07-26 MED ORDER — SODIUM CHLORIDE 0.9 % IJ SOLN
10.0000 mL | INTRAMUSCULAR | Status: DC | PRN
  Administered 2013-07-26: 10 mL
  Filled 2013-07-26: qty 10

## 2013-07-26 NOTE — Patient Instructions (Signed)
[place] Discharge Instructions for Patients Receiving Chemotherapy  Today you received the following chemotherapy agents: abraxane, gemzar   To help prevent nausea and vomiting after your treatment, we encourage you to take your nausea medication.  Take it as often as prescribed.     If you develop nausea and vomiting that is not controlled by your nausea medication, call the clinic. If it is after clinic hours your family physician or the after hours number for the clinic or go to the Emergency Department.   BELOW ARE SYMPTOMS THAT SHOULD BE REPORTED IMMEDIATELY:  *FEVER GREATER THAN 100.5 F  *CHILLS WITH OR WITHOUT FEVER  NAUSEA AND VOMITING THAT IS NOT CONTROLLED WITH YOUR NAUSEA MEDICATION  *UNUSUAL SHORTNESS OF BREATH  *UNUSUAL BRUISING OR BLEEDING  TENDERNESS IN MOUTH AND THROAT WITH OR WITHOUT PRESENCE OF ULCERS  *URINARY PROBLEMS  *BOWEL PROBLEMS  UNUSUAL RASH Items with * indicate a potential emergency and should be followed up as soon as possible.  Feel free to call the clinic you have any questions or concerns. The clinic phone number is [phone].   I have been informed and understand all the instructions given to me. I know to contact the clinic, my physician, or go to the Emergency Department if any problems should occur. I do not have any questions at this time, but understand that I may call the clinic during office hours   should I have any questions or need assistance in obtaining follow up care.    __________________________________________  _____________  __________ Signature of Patient or Authorized Representative            Date                   Time    __________________________________________ Nurse's Signature

## 2013-07-26 NOTE — Progress Notes (Signed)
OK to treat with ANC 1.2 per Dr. Benay Spice.

## 2013-08-02 ENCOUNTER — Ambulatory Visit (HOSPITAL_COMMUNITY)
Admission: RE | Admit: 2013-08-02 | Discharge: 2013-08-02 | Disposition: A | Discharge status: Home / Self Care | Payer: Medicare Other | Source: Ambulatory Visit | Attending: Nurse Practitioner | Admitting: Nurse Practitioner

## 2013-08-02 DIAGNOSIS — D7389 Other diseases of spleen: Secondary | ICD-10-CM | POA: Insufficient documentation

## 2013-08-02 DIAGNOSIS — I714 Abdominal aortic aneurysm, without rupture, unspecified: Secondary | ICD-10-CM | POA: Insufficient documentation

## 2013-08-02 DIAGNOSIS — E278 Other specified disorders of adrenal gland: Secondary | ICD-10-CM | POA: Insufficient documentation

## 2013-08-02 DIAGNOSIS — J984 Other disorders of lung: Secondary | ICD-10-CM | POA: Insufficient documentation

## 2013-08-02 DIAGNOSIS — K7689 Other specified diseases of liver: Secondary | ICD-10-CM | POA: Insufficient documentation

## 2013-08-02 DIAGNOSIS — C259 Malignant neoplasm of pancreas, unspecified: Secondary | ICD-10-CM

## 2013-08-02 MED ORDER — IOHEXOL 300 MG/ML  SOLN
100.0000 mL | Freq: Once | INTRAMUSCULAR | Status: AC | PRN
  Administered 2013-08-02: 100 mL via INTRAVENOUS

## 2013-08-06 ENCOUNTER — Other Ambulatory Visit: Payer: Self-pay | Admitting: Oncology

## 2013-08-09 ENCOUNTER — Ambulatory Visit (HOSPITAL_BASED_OUTPATIENT_CLINIC_OR_DEPARTMENT_OTHER): Payer: Medicare Other | Admitting: Oncology

## 2013-08-09 ENCOUNTER — Encounter (INDEPENDENT_AMBULATORY_CARE_PROVIDER_SITE_OTHER): Payer: Self-pay

## 2013-08-09 ENCOUNTER — Other Ambulatory Visit (HOSPITAL_BASED_OUTPATIENT_CLINIC_OR_DEPARTMENT_OTHER): Payer: Medicare Other

## 2013-08-09 ENCOUNTER — Ambulatory Visit (HOSPITAL_BASED_OUTPATIENT_CLINIC_OR_DEPARTMENT_OTHER): Payer: Medicare Other

## 2013-08-09 VITALS — BP 119/70 | HR 70 | Temp 97.9°F | Resp 20 | Ht 73.0 in | Wt 217.7 lb

## 2013-08-09 DIAGNOSIS — Z5111 Encounter for antineoplastic chemotherapy: Secondary | ICD-10-CM

## 2013-08-09 DIAGNOSIS — C259 Malignant neoplasm of pancreas, unspecified: Secondary | ICD-10-CM

## 2013-08-09 DIAGNOSIS — C252 Malignant neoplasm of tail of pancreas: Secondary | ICD-10-CM

## 2013-08-09 DIAGNOSIS — C787 Secondary malignant neoplasm of liver and intrahepatic bile duct: Secondary | ICD-10-CM

## 2013-08-09 LAB — COMPREHENSIVE METABOLIC PANEL (CC13)
ALBUMIN: 3.8 g/dL (ref 3.5–5.0)
ALT: 14 U/L (ref 0–55)
AST: 14 U/L (ref 5–34)
Alkaline Phosphatase: 46 U/L (ref 40–150)
Anion Gap: 6 mEq/L (ref 3–11)
BUN: 11.6 mg/dL (ref 7.0–26.0)
CALCIUM: 9.2 mg/dL (ref 8.4–10.4)
CHLORIDE: 110 meq/L — AB (ref 98–109)
CO2: 24 mEq/L (ref 22–29)
Creatinine: 1.2 mg/dL (ref 0.7–1.3)
GLUCOSE: 176 mg/dL — AB (ref 70–140)
POTASSIUM: 4.3 meq/L (ref 3.5–5.1)
SODIUM: 140 meq/L (ref 136–145)
Total Bilirubin: 0.48 mg/dL (ref 0.20–1.20)
Total Protein: 6.6 g/dL (ref 6.4–8.3)

## 2013-08-09 LAB — CBC WITH DIFFERENTIAL/PLATELET
BASO%: 0.5 % (ref 0.0–2.0)
BASOS ABS: 0 10*3/uL (ref 0.0–0.1)
EOS%: 1.8 % (ref 0.0–7.0)
Eosinophils Absolute: 0.1 10*3/uL (ref 0.0–0.5)
HEMATOCRIT: 32.3 % — AB (ref 38.4–49.9)
HEMOGLOBIN: 10.4 g/dL — AB (ref 13.0–17.1)
LYMPH#: 0.9 10*3/uL (ref 0.9–3.3)
LYMPH%: 22.3 % (ref 14.0–49.0)
MCH: 28.8 pg (ref 27.2–33.4)
MCHC: 32.2 g/dL (ref 32.0–36.0)
MCV: 89.5 fL (ref 79.3–98.0)
MONO#: 0.4 10*3/uL (ref 0.1–0.9)
MONO%: 10 % (ref 0.0–14.0)
NEUT%: 65.4 % (ref 39.0–75.0)
NEUTROS ABS: 2.6 10*3/uL (ref 1.5–6.5)
Platelets: 140 10*3/uL (ref 140–400)
RBC: 3.61 10*6/uL — ABNORMAL LOW (ref 4.20–5.82)
RDW: 17.6 % — AB (ref 11.0–14.6)
WBC: 4 10*3/uL (ref 4.0–10.3)
nRBC: 0 % (ref 0–0)

## 2013-08-09 MED ORDER — HEPARIN SOD (PORK) LOCK FLUSH 100 UNIT/ML IV SOLN
500.0000 [IU] | Freq: Once | INTRAVENOUS | Status: AC | PRN
  Administered 2013-08-09: 500 [IU]
  Filled 2013-08-09: qty 5

## 2013-08-09 MED ORDER — PACLITAXEL PROTEIN-BOUND CHEMO INJECTION 100 MG
80.0000 mg/m2 | Freq: Once | INTRAVENOUS | Status: AC
  Administered 2013-08-09: 175 mg via INTRAVENOUS
  Filled 2013-08-09: qty 35

## 2013-08-09 MED ORDER — ONDANSETRON 8 MG/NS 50 ML IVPB
INTRAVENOUS | Status: AC
  Filled 2013-08-09: qty 8

## 2013-08-09 MED ORDER — SODIUM CHLORIDE 0.9 % IV SOLN
560.0000 mg/m2 | Freq: Once | INTRAVENOUS | Status: AC
  Administered 2013-08-09: 1254 mg via INTRAVENOUS
  Filled 2013-08-09: qty 32.98

## 2013-08-09 MED ORDER — SODIUM CHLORIDE 0.9 % IJ SOLN
10.0000 mL | INTRAMUSCULAR | Status: DC | PRN
  Administered 2013-08-09: 10 mL
  Filled 2013-08-09: qty 10

## 2013-08-09 MED ORDER — DEXAMETHASONE SODIUM PHOSPHATE 10 MG/ML IJ SOLN
5.0000 mg | Freq: Once | INTRAMUSCULAR | Status: AC
  Administered 2013-08-09: 5 mg via INTRAVENOUS

## 2013-08-09 MED ORDER — SODIUM CHLORIDE 0.9 % IV SOLN
Freq: Once | INTRAVENOUS | Status: AC
  Administered 2013-08-09: 11:00:00 via INTRAVENOUS

## 2013-08-09 MED ORDER — ONDANSETRON 8 MG/50ML IVPB (CHCC)
8.0000 mg | Freq: Once | INTRAVENOUS | Status: AC
  Administered 2013-08-09: 8 mg via INTRAVENOUS

## 2013-08-09 MED ORDER — DEXAMETHASONE SODIUM PHOSPHATE 10 MG/ML IJ SOLN
INTRAMUSCULAR | Status: AC
  Filled 2013-08-09: qty 1

## 2013-08-09 NOTE — Patient Instructions (Signed)
[place] Discharge Instructions for Patients Receiving Chemotherapy  Today you received the following chemotherapy agents abraxane, gemzar.   To help prevent nausea and vomiting after your treatment, we encourage you to take your nausea medication as prescribed.    If you develop nausea and vomiting that is not controlled by your nausea medication, call the clinic.   BELOW ARE SYMPTOMS THAT SHOULD BE REPORTED IMMEDIATELY:  *FEVER GREATER THAN 100.5 F  *CHILLS WITH OR WITHOUT FEVER  NAUSEA AND VOMITING THAT IS NOT CONTROLLED WITH YOUR NAUSEA MEDICATION  *UNUSUAL SHORTNESS OF BREATH  *UNUSUAL BRUISING OR BLEEDING  TENDERNESS IN MOUTH AND THROAT WITH OR WITHOUT PRESENCE OF ULCERS  *URINARY PROBLEMS  *BOWEL PROBLEMS  UNUSUAL RASH Items with * indicate a potential emergency and should be followed up as soon as possible.  Feel free to call the clinic should you have any questions or concerns. The clinic phone number is [phone].

## 2013-08-09 NOTE — Progress Notes (Signed)
   Christian Kaiser    OFFICE PROGRESS NOTE   INTERVAL HISTORY:   He returns for scheduled followup of metastatic pancreas cancer. He continues treatment with gemcitabine/Abraxane, last given on 07/26/2013. No nausea, fever, or neuropathy symptoms. No pain. Good appetite. No specific complaint.  Objective:  Vital signs in last 24 hours:  Blood pressure 119/70, pulse 70, temperature 97.9 F (36.6 C), temperature source Oral, resp. rate 20, height 6\' 1"  (1.854 m), weight 217 lb 11.2 oz (98.748 kg).    HEENT: no thrush or ulcers Resp: lungs clear bilaterally Cardio: regular rate and rhythm GI: no hepatomegaly, nontender, no mass Vascular: no leg edema Neuro:very mild decrease in vibratory sense at the fingertips bilaterally    Portacath/PICC-without erythema  Lab Results:  Lab Results  Component Value Date   WBC 4.0 08/09/2013   HGB 10.4* 08/09/2013   HCT 32.3* 08/09/2013   MCV 89.5 08/09/2013   PLT 140 08/09/2013   NEUTROABS 2.6 08/09/2013    X-rays: Restaging CT of the abdomen and pelvis on 08/08/2013, compared to 04/25/2013: Continued regression of the pancreas mass.stable liver lesions, no new lesions I reviewed the CT images with Christian Kaiser and his wife.  Medications: I have reviewed the patient's current medications.  Assessment/Plan: 1. Adenocarcinoma of the pancreas status post ultrasound-guided biopsy of a liver lesion 10/18/2012 consistent with metastatic pancreas cancer. CT 10/11/2012 revealed a pancreas tail mass and liver metastases. CA 19-9 in normal range on 10/24/2012. Initiation of gemcitabine/Abraxane on a day 1, day 8 day 15 schedule on 10/28/2012. Current schedule is day one/day 8.  Restaging CT 01/26/2013 revealed a decrease in the size of liver metastases and no new lesions  Gemcitabine/Abraxane continued on a day 1, day 8 schedule.  Restaging CT 04/25/2013 with a decrease in the size of the pancreas mass and liver lesions.    Gemcitabine/Abraxane continued on a day 1, day 8 schedule. Restaging CT 08/08/2013-further regression of the pancreas mass, stable liver lesions 2. Abdominal pain secondary to the pancreas mass. Improved. 3. Anorexia/weight loss. Improved. 4. Diabetes. Exacerbated by steroid premedication. Decadron was reduced to 5 mg after cycle 1 day 1. 5. History of coronary artery disease. 6. Headache following cycle 1 day 1 gemcitabine/Abraxane. Question related to Zofran.  7. History of thrombocytopenia secondary to chemotherapy. 8. Mild to moderate loss of vibratory sense in the fingertips. Question secondary to Abraxane versus diabetes. Not interfering with activity at present. 9. Depression-he declined an antidepressant.   Disposition:  He appears stable. He continues to tolerate the gemcitabine and Abraxane well. The pancreas mass continues to decrease in size and there is no CT scan evidence of disease progression. The plan is to continue gemcitabine/Abraxane on a day 1 and day 8 schedule.  He will be scheduled for an office visit on 09/06/2013.   [name], MD  08/09/2013  3:46 PM

## 2013-08-12 ENCOUNTER — Encounter: Payer: Self-pay | Admitting: Oncology

## 2013-08-12 ENCOUNTER — Telehealth: Payer: Self-pay | Admitting: *Deleted

## 2013-08-12 NOTE — Telephone Encounter (Signed)
Mailed provider change letter w/ calendar to pt & cancelled Dr. Gearldine Shown appt.

## 2013-08-14 ENCOUNTER — Telehealth: Payer: Self-pay | Admitting: Oncology

## 2013-08-14 ENCOUNTER — Telehealth: Payer: Self-pay | Admitting: *Deleted

## 2013-08-14 NOTE — Telephone Encounter (Signed)
, °

## 2013-08-14 NOTE — Telephone Encounter (Signed)
Per staff message and POF I have scheduled appts.  JMW  

## 2013-08-16 ENCOUNTER — Ambulatory Visit (HOSPITAL_BASED_OUTPATIENT_CLINIC_OR_DEPARTMENT_OTHER): Payer: Medicare Other

## 2013-08-16 ENCOUNTER — Other Ambulatory Visit (HOSPITAL_BASED_OUTPATIENT_CLINIC_OR_DEPARTMENT_OTHER): Payer: Medicare Other

## 2013-08-16 VITALS — BP 133/78 | HR 71 | Temp 98.5°F

## 2013-08-16 DIAGNOSIS — C787 Secondary malignant neoplasm of liver and intrahepatic bile duct: Secondary | ICD-10-CM

## 2013-08-16 DIAGNOSIS — C252 Malignant neoplasm of tail of pancreas: Secondary | ICD-10-CM

## 2013-08-16 DIAGNOSIS — C259 Malignant neoplasm of pancreas, unspecified: Secondary | ICD-10-CM

## 2013-08-16 DIAGNOSIS — Z5111 Encounter for antineoplastic chemotherapy: Secondary | ICD-10-CM

## 2013-08-16 LAB — CBC WITH DIFFERENTIAL/PLATELET
BASO%: 0.9 % (ref 0.0–2.0)
Basophils Absolute: 0 10*3/uL (ref 0.0–0.1)
EOS%: 1.3 % (ref 0.0–7.0)
Eosinophils Absolute: 0 10*3/uL (ref 0.0–0.5)
HCT: 30.8 % — ABNORMAL LOW (ref 38.4–49.9)
HGB: 10 g/dL — ABNORMAL LOW (ref 13.0–17.1)
LYMPH%: 31.4 % (ref 14.0–49.0)
MCH: 28.9 pg (ref 27.2–33.4)
MCHC: 32.5 g/dL (ref 32.0–36.0)
MCV: 89 fL (ref 79.3–98.0)
MONO#: 0.2 10*3/uL (ref 0.1–0.9)
MONO%: 9.6 % (ref 0.0–14.0)
NEUT#: 1.3 10*3/uL — ABNORMAL LOW (ref 1.5–6.5)
NEUT%: 56.8 % (ref 39.0–75.0)
Platelets: 144 10*3/uL (ref 140–400)
RBC: 3.46 10*6/uL — AB (ref 4.20–5.82)
RDW: 17 % — ABNORMAL HIGH (ref 11.0–14.6)
WBC: 2.3 10*3/uL — AB (ref 4.0–10.3)
lymph#: 0.7 10*3/uL — ABNORMAL LOW (ref 0.9–3.3)

## 2013-08-16 MED ORDER — SODIUM CHLORIDE 0.9 % IV SOLN
Freq: Once | INTRAVENOUS | Status: AC
  Administered 2013-08-16: 10:00:00 via INTRAVENOUS

## 2013-08-16 MED ORDER — HEPARIN SOD (PORK) LOCK FLUSH 100 UNIT/ML IV SOLN
500.0000 [IU] | Freq: Once | INTRAVENOUS | Status: AC | PRN
  Administered 2013-08-16: 500 [IU]
  Filled 2013-08-16: qty 5

## 2013-08-16 MED ORDER — PACLITAXEL PROTEIN-BOUND CHEMO INJECTION 100 MG
80.0000 mg/m2 | Freq: Once | INTRAVENOUS | Status: AC
  Administered 2013-08-16: 175 mg via INTRAVENOUS
  Filled 2013-08-16: qty 35

## 2013-08-16 MED ORDER — ONDANSETRON 8 MG/50ML IVPB (CHCC)
8.0000 mg | Freq: Once | INTRAVENOUS | Status: AC
  Administered 2013-08-16: 8 mg via INTRAVENOUS

## 2013-08-16 MED ORDER — DEXAMETHASONE SODIUM PHOSPHATE 10 MG/ML IJ SOLN
INTRAMUSCULAR | Status: AC
  Filled 2013-08-16: qty 1

## 2013-08-16 MED ORDER — SODIUM CHLORIDE 0.9 % IJ SOLN
10.0000 mL | INTRAMUSCULAR | Status: DC | PRN
  Administered 2013-08-16: 10 mL
  Filled 2013-08-16: qty 10

## 2013-08-16 MED ORDER — DEXAMETHASONE SODIUM PHOSPHATE 10 MG/ML IJ SOLN
5.0000 mg | Freq: Once | INTRAMUSCULAR | Status: AC
  Administered 2013-08-16: 5 mg via INTRAVENOUS

## 2013-08-16 MED ORDER — GEMCITABINE HCL CHEMO INJECTION 1 GM/26.3ML
560.0000 mg/m2 | Freq: Once | INTRAVENOUS | Status: AC
  Administered 2013-08-16: 1254 mg via INTRAVENOUS
  Filled 2013-08-16: qty 32.98

## 2013-08-16 MED ORDER — ONDANSETRON 8 MG/NS 50 ML IVPB
INTRAVENOUS | Status: AC
  Filled 2013-08-16: qty 8

## 2013-08-16 NOTE — Patient Instructions (Signed)
[facility] Discharge Instructions for Patients Receiving Chemotherapy  Today you received the following chemotherapy agents:  Abraxane and Gemzar  To help prevent nausea and vomiting after your treatment, we encourage you to take your nausea medication as ordered per MD.   If you develop nausea and vomiting that is not controlled by your nausea medication, call the clinic.   BELOW ARE SYMPTOMS THAT SHOULD BE REPORTED IMMEDIATELY:  *FEVER GREATER THAN 100.5 F  *CHILLS WITH OR WITHOUT FEVER  NAUSEA AND VOMITING THAT IS NOT CONTROLLED WITH YOUR NAUSEA MEDICATION  *UNUSUAL SHORTNESS OF BREATH  *UNUSUAL BRUISING OR BLEEDING  TENDERNESS IN MOUTH AND THROAT WITH OR WITHOUT PRESENCE OF ULCERS  *URINARY PROBLEMS  *BOWEL PROBLEMS  UNUSUAL RASH Items with * indicate a potential emergency and should be followed up as soon as possible.  Feel free to call the clinic you have any questions or concerns. The clinic phone number is [phone].    

## 2013-08-16 NOTE — Progress Notes (Signed)
Per Dr Benay Spice it is okay to treat pt today with chemotherapy and todays CBC/DIFF results.

## 2013-08-27 ENCOUNTER — Other Ambulatory Visit: Payer: Self-pay | Admitting: Oncology

## 2013-08-30 ENCOUNTER — Other Ambulatory Visit (HOSPITAL_BASED_OUTPATIENT_CLINIC_OR_DEPARTMENT_OTHER): Payer: Medicare Other

## 2013-08-30 ENCOUNTER — Ambulatory Visit (HOSPITAL_BASED_OUTPATIENT_CLINIC_OR_DEPARTMENT_OTHER): Payer: Medicare Other

## 2013-08-30 VITALS — BP 135/70 | HR 65 | Temp 97.7°F

## 2013-08-30 DIAGNOSIS — Z5111 Encounter for antineoplastic chemotherapy: Secondary | ICD-10-CM

## 2013-08-30 DIAGNOSIS — C259 Malignant neoplasm of pancreas, unspecified: Secondary | ICD-10-CM

## 2013-08-30 DIAGNOSIS — C787 Secondary malignant neoplasm of liver and intrahepatic bile duct: Secondary | ICD-10-CM

## 2013-08-30 DIAGNOSIS — C252 Malignant neoplasm of tail of pancreas: Secondary | ICD-10-CM

## 2013-08-30 LAB — CBC WITH DIFFERENTIAL/PLATELET
BASO%: 0.5 % (ref 0.0–2.0)
Basophils Absolute: 0 10*3/uL (ref 0.0–0.1)
EOS%: 1.9 % (ref 0.0–7.0)
Eosinophils Absolute: 0.1 10*3/uL (ref 0.0–0.5)
HEMATOCRIT: 31.6 % — AB (ref 38.4–49.9)
HGB: 10.2 g/dL — ABNORMAL LOW (ref 13.0–17.1)
LYMPH#: 0.8 10*3/uL — AB (ref 0.9–3.3)
LYMPH%: 19.8 % (ref 14.0–49.0)
MCH: 28.7 pg (ref 27.2–33.4)
MCHC: 32.3 g/dL (ref 32.0–36.0)
MCV: 89 fL (ref 79.3–98.0)
MONO#: 0.4 10*3/uL (ref 0.1–0.9)
MONO%: 8.7 % (ref 0.0–14.0)
NEUT#: 2.9 10*3/uL (ref 1.5–6.5)
NEUT%: 69.1 % (ref 39.0–75.0)
Platelets: 138 10*3/uL — ABNORMAL LOW (ref 140–400)
RBC: 3.55 10*6/uL — AB (ref 4.20–5.82)
RDW: 17.3 % — ABNORMAL HIGH (ref 11.0–14.6)
WBC: 4.2 10*3/uL (ref 4.0–10.3)
nRBC: 0 % (ref 0–0)

## 2013-08-30 MED ORDER — SODIUM CHLORIDE 0.9 % IV SOLN
Freq: Once | INTRAVENOUS | Status: AC
  Administered 2013-08-30: 11:00:00 via INTRAVENOUS

## 2013-08-30 MED ORDER — SODIUM CHLORIDE 0.9 % IJ SOLN
10.0000 mL | INTRAMUSCULAR | Status: DC | PRN
  Administered 2013-08-30: 10 mL
  Filled 2013-08-30: qty 10

## 2013-08-30 MED ORDER — DEXAMETHASONE SODIUM PHOSPHATE 10 MG/ML IJ SOLN
5.0000 mg | Freq: Once | INTRAMUSCULAR | Status: AC
  Administered 2013-08-30: 5 mg via INTRAVENOUS

## 2013-08-30 MED ORDER — ONDANSETRON 8 MG/NS 50 ML IVPB
INTRAVENOUS | Status: AC
  Filled 2013-08-30: qty 8

## 2013-08-30 MED ORDER — PACLITAXEL PROTEIN-BOUND CHEMO INJECTION 100 MG
80.0000 mg/m2 | Freq: Once | INTRAVENOUS | Status: AC
  Administered 2013-08-30: 175 mg via INTRAVENOUS
  Filled 2013-08-30: qty 35

## 2013-08-30 MED ORDER — DEXAMETHASONE SODIUM PHOSPHATE 10 MG/ML IJ SOLN
INTRAMUSCULAR | Status: AC
  Filled 2013-08-30: qty 1

## 2013-08-30 MED ORDER — ONDANSETRON 8 MG/50ML IVPB (CHCC)
8.0000 mg | Freq: Once | INTRAVENOUS | Status: AC
  Administered 2013-08-30: 8 mg via INTRAVENOUS

## 2013-08-30 MED ORDER — HEPARIN SOD (PORK) LOCK FLUSH 100 UNIT/ML IV SOLN
500.0000 [IU] | Freq: Once | INTRAVENOUS | Status: AC | PRN
  Administered 2013-08-30: 500 [IU]
  Filled 2013-08-30: qty 5

## 2013-08-30 MED ORDER — SODIUM CHLORIDE 0.9 % IV SOLN
560.0000 mg/m2 | Freq: Once | INTRAVENOUS | Status: AC
  Administered 2013-08-30: 1254 mg via INTRAVENOUS
  Filled 2013-08-30: qty 32.98

## 2013-08-30 NOTE — Patient Instructions (Signed)
[facility] Discharge Instructions for Patients Receiving Chemotherapy  Today you received the following chemotherapy agents: Abraxane and Gemzar. To help prevent nausea and vomiting after your treatment, we encourage you to take your nausea medication, Compazine. Take one every six hours as needed.   If you develop nausea and vomiting that is not controlled by your nausea medication, call the clinic.   BELOW ARE SYMPTOMS THAT SHOULD BE REPORTED IMMEDIATELY:  *FEVER GREATER THAN 100.5 F  *CHILLS WITH OR WITHOUT FEVER  NAUSEA AND VOMITING THAT IS NOT CONTROLLED WITH YOUR NAUSEA MEDICATION  *UNUSUAL SHORTNESS OF BREATH  *UNUSUAL BRUISING OR BLEEDING  TENDERNESS IN MOUTH AND THROAT WITH OR WITHOUT PRESENCE OF ULCERS  *URINARY PROBLEMS  *BOWEL PROBLEMS  UNUSUAL RASH Items with * indicate a potential emergency and should be followed up as soon as possible.  Feel free to call the clinic should you have any questions or concerns. The clinic phone number is [phone].    

## 2013-08-31 ENCOUNTER — Encounter: Payer: Self-pay | Admitting: Oncology

## 2013-08-31 NOTE — Progress Notes (Signed)
Per Debby info from [facility] being sent to patient for asst with Abraxane. I called and let him know it will be sent to him and he will bring in to me to fax.

## 2013-09-01 ENCOUNTER — Encounter: Payer: Self-pay | Admitting: Oncology

## 2013-09-01 NOTE — Progress Notes (Signed)
Called to see if asst for Gemzar for patient. [facility] has Abraxane app and will fax me one for Gemzar.

## 2013-09-03 ENCOUNTER — Other Ambulatory Visit: Payer: Self-pay | Admitting: Oncology

## 2013-09-04 ENCOUNTER — Other Ambulatory Visit: Payer: Medicare Other

## 2013-09-04 ENCOUNTER — Ambulatory Visit: Payer: Medicare Other | Admitting: Internal Medicine

## 2013-09-06 ENCOUNTER — Ambulatory Visit (HOSPITAL_BASED_OUTPATIENT_CLINIC_OR_DEPARTMENT_OTHER): Payer: Medicare Other

## 2013-09-06 ENCOUNTER — Other Ambulatory Visit: Payer: Medicare Other

## 2013-09-06 ENCOUNTER — Other Ambulatory Visit (HOSPITAL_BASED_OUTPATIENT_CLINIC_OR_DEPARTMENT_OTHER): Payer: Medicare Other

## 2013-09-06 ENCOUNTER — Ambulatory Visit: Payer: Medicare Other | Admitting: Oncology

## 2013-09-06 ENCOUNTER — Ambulatory Visit (HOSPITAL_BASED_OUTPATIENT_CLINIC_OR_DEPARTMENT_OTHER): Payer: Medicare Other | Admitting: Nurse Practitioner

## 2013-09-06 ENCOUNTER — Telehealth: Payer: Self-pay | Admitting: Oncology

## 2013-09-06 VITALS — BP 134/86 | HR 78 | Temp 98.0°F | Resp 18 | Ht 73.0 in | Wt 217.5 lb

## 2013-09-06 DIAGNOSIS — F3289 Other specified depressive episodes: Secondary | ICD-10-CM

## 2013-09-06 DIAGNOSIS — C787 Secondary malignant neoplasm of liver and intrahepatic bile duct: Secondary | ICD-10-CM

## 2013-09-06 DIAGNOSIS — C259 Malignant neoplasm of pancreas, unspecified: Secondary | ICD-10-CM

## 2013-09-06 DIAGNOSIS — C252 Malignant neoplasm of tail of pancreas: Secondary | ICD-10-CM

## 2013-09-06 DIAGNOSIS — E119 Type 2 diabetes mellitus without complications: Secondary | ICD-10-CM

## 2013-09-06 DIAGNOSIS — G893 Neoplasm related pain (acute) (chronic): Secondary | ICD-10-CM

## 2013-09-06 DIAGNOSIS — F329 Major depressive disorder, single episode, unspecified: Secondary | ICD-10-CM

## 2013-09-06 DIAGNOSIS — Z5111 Encounter for antineoplastic chemotherapy: Secondary | ICD-10-CM

## 2013-09-06 LAB — CBC WITH DIFFERENTIAL/PLATELET
BASO%: 0.3 % (ref 0.0–2.0)
Basophils Absolute: 0 10*3/uL (ref 0.0–0.1)
EOS%: 1.5 % (ref 0.0–7.0)
Eosinophils Absolute: 0 10*3/uL (ref 0.0–0.5)
HCT: 31.9 % — ABNORMAL LOW (ref 38.4–49.9)
HEMOGLOBIN: 10.4 g/dL — AB (ref 13.0–17.1)
LYMPH#: 0.6 10*3/uL — AB (ref 0.9–3.3)
LYMPH%: 23.5 % (ref 14.0–49.0)
MCH: 29 pg (ref 27.2–33.4)
MCHC: 32.6 g/dL (ref 32.0–36.0)
MCV: 89.1 fL (ref 79.3–98.0)
MONO#: 0.2 10*3/uL (ref 0.1–0.9)
MONO%: 9.3 % (ref 0.0–14.0)
NEUT#: 1.6 10*3/uL (ref 1.5–6.5)
NEUT%: 65.4 % (ref 39.0–75.0)
Platelets: 155 10*3/uL (ref 140–400)
RBC: 3.58 10*6/uL — ABNORMAL LOW (ref 4.20–5.82)
RDW: 18.8 % — ABNORMAL HIGH (ref 11.0–14.6)
WBC: 2.4 10*3/uL — ABNORMAL LOW (ref 4.0–10.3)
nRBC: 1 % — ABNORMAL HIGH (ref 0–0)

## 2013-09-06 LAB — COMPREHENSIVE METABOLIC PANEL (CC13)
ALBUMIN: 3.7 g/dL (ref 3.5–5.0)
ALK PHOS: 48 U/L (ref 40–150)
ALT: 17 U/L (ref 0–55)
AST: 16 U/L (ref 5–34)
Anion Gap: 10 mEq/L (ref 3–11)
BUN: 12 mg/dL (ref 7.0–26.0)
CALCIUM: 9.1 mg/dL (ref 8.4–10.4)
CHLORIDE: 106 meq/L (ref 98–109)
CO2: 22 mEq/L (ref 22–29)
Creatinine: 1.1 mg/dL (ref 0.7–1.3)
GLUCOSE: 259 mg/dL — AB (ref 70–140)
POTASSIUM: 4.2 meq/L (ref 3.5–5.1)
Sodium: 138 mEq/L (ref 136–145)
Total Bilirubin: 0.39 mg/dL (ref 0.20–1.20)
Total Protein: 6.6 g/dL (ref 6.4–8.3)

## 2013-09-06 MED ORDER — ONDANSETRON 8 MG/50ML IVPB (CHCC)
8.0000 mg | Freq: Once | INTRAVENOUS | Status: AC
  Administered 2013-09-06: 8 mg via INTRAVENOUS

## 2013-09-06 MED ORDER — PACLITAXEL PROTEIN-BOUND CHEMO INJECTION 100 MG
80.0000 mg/m2 | Freq: Once | INTRAVENOUS | Status: AC
  Administered 2013-09-06: 175 mg via INTRAVENOUS
  Filled 2013-09-06: qty 35

## 2013-09-06 MED ORDER — DEXAMETHASONE SODIUM PHOSPHATE 10 MG/ML IJ SOLN
INTRAMUSCULAR | Status: AC
  Filled 2013-09-06: qty 1

## 2013-09-06 MED ORDER — DEXAMETHASONE SODIUM PHOSPHATE 10 MG/ML IJ SOLN
5.0000 mg | Freq: Once | INTRAMUSCULAR | Status: AC
  Administered 2013-09-06: 5 mg via INTRAVENOUS

## 2013-09-06 MED ORDER — HEPARIN SOD (PORK) LOCK FLUSH 100 UNIT/ML IV SOLN
500.0000 [IU] | Freq: Once | INTRAVENOUS | Status: AC | PRN
  Administered 2013-09-06: 500 [IU]
  Filled 2013-09-06: qty 5

## 2013-09-06 MED ORDER — SODIUM CHLORIDE 0.9 % IJ SOLN
10.0000 mL | INTRAMUSCULAR | Status: DC | PRN
  Administered 2013-09-06: 10 mL
  Filled 2013-09-06: qty 10

## 2013-09-06 MED ORDER — SODIUM CHLORIDE 0.9 % IV SOLN
Freq: Once | INTRAVENOUS | Status: AC
  Administered 2013-09-06: 12:00:00 via INTRAVENOUS

## 2013-09-06 MED ORDER — ONDANSETRON 8 MG/NS 50 ML IVPB
INTRAVENOUS | Status: AC
  Filled 2013-09-06: qty 8

## 2013-09-06 MED ORDER — GEMCITABINE HCL CHEMO INJECTION 1 GM/26.3ML
560.0000 mg/m2 | Freq: Once | INTRAVENOUS | Status: AC
  Administered 2013-09-06: 1254 mg via INTRAVENOUS
  Filled 2013-09-06: qty 33

## 2013-09-06 NOTE — Progress Notes (Signed)
Okay to tx with WBC-2.4 per Lattie Haw NP

## 2013-09-06 NOTE — Patient Instructions (Signed)
[place] Discharge Instructions for Patients Receiving Chemotherapy  Today you received the following chemotherapy agents: Gemzar and Abraxane  To help prevent nausea and vomiting after your treatment, we encourage you to take your nausea medication: Compazine 10 mg every 6 hrs as needed.   If you develop nausea and vomiting that is not controlled by your nausea medication, call the clinic.   BELOW ARE SYMPTOMS THAT SHOULD BE REPORTED IMMEDIATELY:  *FEVER GREATER THAN 100.5 F  *CHILLS WITH OR WITHOUT FEVER  NAUSEA AND VOMITING THAT IS NOT CONTROLLED WITH YOUR NAUSEA MEDICATION  *UNUSUAL SHORTNESS OF BREATH  *UNUSUAL BRUISING OR BLEEDING  TENDERNESS IN MOUTH AND THROAT WITH OR WITHOUT PRESENCE OF ULCERS  *URINARY PROBLEMS  *BOWEL PROBLEMS  UNUSUAL RASH Items with * indicate a potential emergency and should be followed up as soon as possible.  Feel free to call the clinic you have any questions or concerns. The clinic phone number is [phone].

## 2013-09-06 NOTE — Progress Notes (Signed)
OFFICE PROGRESS NOTE  Interval history:  Christian Kaiser returns for followup of metastatic pancreas cancer. He continues treatment with gemcitabine/Abraxane on a day 1, day 8 schedule. He denies nausea/vomiting. No mouth sores. No diarrhea. No skin rash. He denies numbness or tingling in his hands or feet. He had an episode of left-sided abdominal pain recently. The pain resolved over about 2 days. He did not have to take pain medication. He denies any pain at present.   Objective: Filed Vitals:   09/06/13 1053  BP: 134/86  Pulse: 78  Temp: 98 F (36.7 C)  Resp: 18   Oropharynx is without thrush or ulceration. Lungs are clear. No wheezes or rales. Regular cardiac rhythm. Port-A-Cath site without erythema. Abdomen soft. Very mild tenderness at the left upper quadrant. No organomegaly. No mass. No leg edema. Vibratory sense mildly to moderately decreased over the fingertips per tuning fork exam.   Lab Results: Lab Results  Component Value Date   WBC 2.4* 09/06/2013   HGB 10.4* 09/06/2013   HCT 31.9* 09/06/2013   MCV 89.1 09/06/2013   PLT 155 09/06/2013   NEUTROABS 1.6 09/06/2013    Chemistry:    Chemistry      Component Value Date/Time   NA 140 08/09/2013 0904   NA 135 11/28/2011 0515   K 4.3 08/09/2013 0904   K 4.0 11/28/2011 0515   CL 107 11/25/2012 0934   CL 101 11/28/2011 0515   CO2 24 08/09/2013 0904   CO2 22 11/28/2011 0515   BUN 11.6 08/09/2013 0904   BUN 26* 11/28/2011 0515   CREATININE 1.2 08/09/2013 0904   CREATININE 1.29 11/28/2011 0515      Component Value Date/Time   CALCIUM 9.2 08/09/2013 0904   CALCIUM 9.2 11/28/2011 0515   ALKPHOS 46 08/09/2013 0904   ALKPHOS 43 11/27/2011 2229   AST 14 08/09/2013 0904   AST 14 11/27/2011 2229   ALT 14 08/09/2013 0904   ALT 13 11/27/2011 2229   BILITOT 0.48 08/09/2013 0904   BILITOT 0.2* 11/27/2011 2229       Studies/Results: No results found.  Medications: I have reviewed the patient's current  medications.  Assessment/Plan: 1. Adenocarcinoma of the pancreas status post ultrasound-guided biopsy of a liver lesion 10/18/2012 consistent with metastatic pancreas cancer. CT 10/11/2012 revealed a pancreas tail mass and liver metastases. CA 19-9 in normal range on 10/24/2012. Initiation of gemcitabine/Abraxane on a day 1, day 8 day 15 schedule on 10/28/2012. Current schedule is day one/day 8.  Restaging CT 01/26/2013 revealed a decrease in the size of liver metastases and no new lesions  Gemcitabine/Abraxane continued on a day 1, day 8 schedule.  Restaging CT 04/25/2013 with a decrease in the size of the pancreas mass and liver lesions.  Gemcitabine/Abraxane continued on a day 1, day 8 schedule.  Restaging CT 08/08/2013-further regression of the pancreas mass, stable liver lesions. Gemcitabine/Abraxane continued on a day 1, day 8 schedule. 2. Abdominal pain secondary to the pancreas mass. Improved. 3. Anorexia/weight loss. Improved. 4. Diabetes. Exacerbated by steroid premedication. Decadron was reduced to 5 mg after cycle 1 day 1. 5. History of coronary artery disease. 6. Headache following cycle 1 day 1 gemcitabine/Abraxane. Question related to Zofran.  7. History of thrombocytopenia secondary to chemotherapy. 8. Mild to moderate loss of vibratory sense in the fingertips. Question secondary to Abraxane versus diabetes. Not interfering with activity at present. 9. Depression-he declined an antidepressant.  Dispositon-he appears stable. Plan to continue gemcitabine/Abraxane on the current schedule.  He will return for a followup visit on 10/11/2013. He will contact the office in the interim with any problems.  Plan reviewed with Dr. Benay Spice.   Ned Card ANP/GNP-BC

## 2013-09-06 NOTE — Telephone Encounter (Signed)
gv adn printed aptps ched and avs for pt for April adn May....sed added tx. °

## 2013-09-12 ENCOUNTER — Telehealth: Payer: Self-pay | Admitting: *Deleted

## 2013-09-12 NOTE — Telephone Encounter (Addendum)
Message from pt's wife reporting he doesn't feel well. Returned call, pt report LLE pain since 3/27. Pain extends from calf to groin. Also having L neck pain. Denies edema or erythema. Pt reports pain is constant-- not exacerbated by ambulation. Pt also reports fatigue.  Reviewed with Dr. Benay Spice: Order received to work pt in to be evaluated in office. Pt given appt for 09/13/13.

## 2013-09-13 ENCOUNTER — Telehealth: Payer: Self-pay | Admitting: Oncology

## 2013-09-13 ENCOUNTER — Ambulatory Visit (HOSPITAL_BASED_OUTPATIENT_CLINIC_OR_DEPARTMENT_OTHER): Payer: Medicare Other | Admitting: Nurse Practitioner

## 2013-09-13 ENCOUNTER — Ambulatory Visit (HOSPITAL_COMMUNITY)
Admission: RE | Admit: 2013-09-13 | Discharge: 2013-09-13 | Disposition: A | Discharge status: Home / Self Care | Payer: Medicare Other | Source: Ambulatory Visit | Attending: Oncology | Admitting: Oncology

## 2013-09-13 VITALS — BP 128/77 | HR 92 | Resp 18 | Ht 73.0 in | Wt 214.7 lb

## 2013-09-13 DIAGNOSIS — R42 Dizziness and giddiness: Secondary | ICD-10-CM

## 2013-09-13 DIAGNOSIS — C259 Malignant neoplasm of pancreas, unspecified: Secondary | ICD-10-CM

## 2013-09-13 DIAGNOSIS — Z79899 Other long term (current) drug therapy: Secondary | ICD-10-CM | POA: Insufficient documentation

## 2013-09-13 DIAGNOSIS — R51 Headache: Secondary | ICD-10-CM

## 2013-09-13 DIAGNOSIS — F3289 Other specified depressive episodes: Secondary | ICD-10-CM

## 2013-09-13 DIAGNOSIS — M79609 Pain in unspecified limb: Secondary | ICD-10-CM

## 2013-09-13 DIAGNOSIS — F329 Major depressive disorder, single episode, unspecified: Secondary | ICD-10-CM

## 2013-09-13 NOTE — Progress Notes (Signed)
VASCULAR LAB PRELIMINARY  PRELIMINARY  PRELIMINARY  PRELIMINARY  Left lower extremity venous duplex completed.    Preliminary report:  Left:  No evidence of DVT, superficial thrombosis, or Baker's cyst.  Azlynn Mitnick, RVT 09/13/2013, 4:02 PM

## 2013-09-13 NOTE — Telephone Encounter (Signed)
gv and printed appt sched for April and May...sent pt to doppler

## 2013-09-13 NOTE — Progress Notes (Addendum)
[facility] OFFICE PROGRESS NOTE   Diagnosis:  Metastatic pancreas cancer.  INTERVAL HISTORY:   Mr. Townley is a 79 year old man with metastatic pancreas cancer currently on active treatment with gemcitabine/Abraxane on a day one/day 8 schedule. Most recent restaging CT evaluation on 08/08/2013 showed further regression of the pancreas mass and stable liver lesions.  He presents today prior to scheduled followup with multiple complaints. He reports an "achy" pain in the left calf similar to a toothache for about a week. He denies leg swelling. No shortness of breath or chest pain. Over the past several months he has been experiencing pain at the right temporal region followed by dizziness. The pain and dizziness last "a few minutes". Symptoms seem to be occurring more frequently. He had 3 or 4 episodes yesterday. He denies any visual disturbance. His wife reports he is depressed. He has "crying episodes". He is not interested in antidepressant.  Objective:  Vital signs in last 24 hours:  Blood pressure 128/77, pulse 92, temperature 0 F (-17.8 C), resp. rate 18, height 6\' 1"  (1.854 m), weight 214 lb 11.2 oz (97.387 kg), SpO2 100.00%.    HEENT: No thrush or ulcerations. Resp: Lungs clear. Cardio: Regular cardiac rhythm. GI: Abdomen soft and nontender. No organomegaly. Vascular: No leg edema. Question slight fullness of the left lateral calf. The left calf is tender. Superficial varicosities present bilaterally below the knees. No cord palpated. Neuro: Pupils equal round and reactive to light. Extraocular movements intact. Motor strength 5 over 5. Finger to nose intact.  Skin: Left lateral calf with faint hyperpigmentation.   Portacath/PICC-without erythema.  Lab Results:  Lab Results  Component Value Date   WBC 2.4* 09/06/2013   HGB 10.4* 09/06/2013   HCT 31.9* 09/06/2013   MCV 89.1 09/06/2013   PLT 155 09/06/2013   NEUTROABS 1.6 09/06/2013     Imaging:  No  results found.  Medications: I have reviewed the patient's current medications.  Assessment/Plan: 1. Adenocarcinoma of the pancreas status post ultrasound-guided biopsy of a liver lesion 10/18/2012 consistent with metastatic pancreas cancer. CT 10/11/2012 revealed a pancreas tail mass and liver metastases. CA 19-9 in normal range on 10/24/2012. Initiation of gemcitabine/Abraxane on a day 1, day 8 day 15 schedule on 10/28/2012. Current schedule is day one/day 8.  Restaging CT 01/26/2013 revealed a decrease in the size of liver metastases and no new lesions  Gemcitabine/Abraxane continued on a day 1, day 8 schedule.  Restaging CT 04/25/2013 with a decrease in the size of the pancreas mass and liver lesions.  Gemcitabine/Abraxane continued on a day 1, day 8 schedule.  Restaging CT 08/08/2013-further regression of the pancreas mass, stable liver lesions.  Gemcitabine/Abraxane continued on a day 1, day 8 schedule. 2. Abdominal pain secondary to the pancreas mass. Improved. 3. Anorexia/weight loss. Improved. 4. Diabetes. Exacerbated by steroid premedication. Decadron was reduced to 5 mg after cycle 1 day 1. 5. History of coronary artery disease. 6. Headache following cycle 1 day 1 gemcitabine/Abraxane. Question related to Zofran.  7. History of thrombocytopenia secondary to chemotherapy. 8. Mild to moderate loss of vibratory sense in the fingertips. Question secondary to Abraxane versus diabetes. Not interfering with activity at present. 9. Depression-he declined an antidepressant. Counseling services were offered as well. 10. Left calf pain/tenderness. Question benign musculoskeletal injury, question superficial or deep vein thrombosis. 11. Intermittent right-sided headaches and dizziness. He will followup with Dr. Felipa Eth.   Disposition: Mr. Gellner presents prior to scheduled followup for evaluation  of left calf pain/tenderness. We are referring him for a venous Doppler. If the Doppler is  positive for a deep vein thrombosis we will initiate anticoagulation.  He will followup with Dr. Felipa Eth regarding the headaches and dizziness.  We discussed the depression he is experiencing. He continues to decline an antidepressant. He will contact the office if he changes his mind. We also offered counseling services.  He will return as scheduled.  Patient seen with Dr. Benay Spice.    Ned Card ANP/GNP-BC   09/13/2013  12:42 PM This was a shared visit with Ned Card. Mr. Djordjevic was interviewed and examined. He will be referred for a Doppler of the left leg to rule out a deep vein thrombosis.no physical exam evidence of superficial phlebitis.  [name], M.D.

## 2013-09-17 ENCOUNTER — Other Ambulatory Visit: Payer: Self-pay | Admitting: Oncology

## 2013-09-20 ENCOUNTER — Other Ambulatory Visit: Payer: Medicare Other

## 2013-09-20 ENCOUNTER — Ambulatory Visit: Payer: Medicare Other

## 2013-09-20 ENCOUNTER — Ambulatory Visit (HOSPITAL_BASED_OUTPATIENT_CLINIC_OR_DEPARTMENT_OTHER): Payer: Medicare Other

## 2013-09-20 ENCOUNTER — Ambulatory Visit: Payer: Medicare Other | Admitting: Nutrition

## 2013-09-20 ENCOUNTER — Other Ambulatory Visit (HOSPITAL_BASED_OUTPATIENT_CLINIC_OR_DEPARTMENT_OTHER): Payer: Medicare Other

## 2013-09-20 VITALS — BP 159/73 | HR 76 | Temp 98.0°F | Resp 18

## 2013-09-20 DIAGNOSIS — C252 Malignant neoplasm of tail of pancreas: Secondary | ICD-10-CM

## 2013-09-20 DIAGNOSIS — Z5111 Encounter for antineoplastic chemotherapy: Secondary | ICD-10-CM

## 2013-09-20 DIAGNOSIS — Z95828 Presence of other vascular implants and grafts: Secondary | ICD-10-CM

## 2013-09-20 DIAGNOSIS — C787 Secondary malignant neoplasm of liver and intrahepatic bile duct: Secondary | ICD-10-CM

## 2013-09-20 DIAGNOSIS — C259 Malignant neoplasm of pancreas, unspecified: Secondary | ICD-10-CM

## 2013-09-20 LAB — CBC WITH DIFFERENTIAL/PLATELET
BASO%: 0.9 % (ref 0.0–2.0)
BASOS ABS: 0 10*3/uL (ref 0.0–0.1)
EOS%: 2.7 % (ref 0.0–7.0)
Eosinophils Absolute: 0.1 10*3/uL (ref 0.0–0.5)
HCT: 30.3 % — ABNORMAL LOW (ref 38.4–49.9)
HGB: 10.1 g/dL — ABNORMAL LOW (ref 13.0–17.1)
LYMPH%: 16.6 % (ref 14.0–49.0)
MCH: 30 pg (ref 27.2–33.4)
MCHC: 33.2 g/dL (ref 32.0–36.0)
MCV: 90.3 fL (ref 79.3–98.0)
MONO#: 0.5 10*3/uL (ref 0.1–0.9)
MONO%: 11.4 % (ref 0.0–14.0)
NEUT#: 3 10*3/uL (ref 1.5–6.5)
NEUT%: 68.4 % (ref 39.0–75.0)
Platelets: 139 10*3/uL — ABNORMAL LOW (ref 140–400)
RBC: 3.35 10*6/uL — ABNORMAL LOW (ref 4.20–5.82)
RDW: 19.5 % — ABNORMAL HIGH (ref 11.0–14.6)
WBC: 4.3 10*3/uL (ref 4.0–10.3)
lymph#: 0.7 10*3/uL — ABNORMAL LOW (ref 0.9–3.3)

## 2013-09-20 LAB — COMPREHENSIVE METABOLIC PANEL (CC13)
ALK PHOS: 46 U/L (ref 40–150)
ALT: 12 U/L (ref 0–55)
AST: 16 U/L (ref 5–34)
Albumin: 3.7 g/dL (ref 3.5–5.0)
Anion Gap: 9 mEq/L (ref 3–11)
BUN: 15 mg/dL (ref 7.0–26.0)
CALCIUM: 8.9 mg/dL (ref 8.4–10.4)
CO2: 23 mEq/L (ref 22–29)
CREATININE: 1.3 mg/dL (ref 0.7–1.3)
Chloride: 109 mEq/L (ref 98–109)
Glucose: 254 mg/dl — ABNORMAL HIGH (ref 70–140)
POTASSIUM: 4 meq/L (ref 3.5–5.1)
Sodium: 140 mEq/L (ref 136–145)
Total Bilirubin: 0.39 mg/dL (ref 0.20–1.20)
Total Protein: 6.5 g/dL (ref 6.4–8.3)

## 2013-09-20 MED ORDER — DEXAMETHASONE SODIUM PHOSPHATE 10 MG/ML IJ SOLN
INTRAMUSCULAR | Status: AC
  Filled 2013-09-20: qty 1

## 2013-09-20 MED ORDER — SODIUM CHLORIDE 0.9 % IJ SOLN
10.0000 mL | INTRAMUSCULAR | Status: DC | PRN
  Administered 2013-09-20: 10 mL via INTRAVENOUS
  Filled 2013-09-20: qty 10

## 2013-09-20 MED ORDER — DEXAMETHASONE SODIUM PHOSPHATE 10 MG/ML IJ SOLN
5.0000 mg | Freq: Once | INTRAMUSCULAR | Status: AC
  Administered 2013-09-20: 5 mg via INTRAVENOUS

## 2013-09-20 MED ORDER — SODIUM CHLORIDE 0.9 % IV SOLN
Freq: Once | INTRAVENOUS | Status: AC
  Administered 2013-09-20: 11:00:00 via INTRAVENOUS

## 2013-09-20 MED ORDER — ONDANSETRON 8 MG/50ML IVPB (CHCC)
8.0000 mg | Freq: Once | INTRAVENOUS | Status: AC
  Administered 2013-09-20: 8 mg via INTRAVENOUS

## 2013-09-20 MED ORDER — PACLITAXEL PROTEIN-BOUND CHEMO INJECTION 100 MG
80.0000 mg/m2 | Freq: Once | INTRAVENOUS | Status: AC
  Administered 2013-09-20: 175 mg via INTRAVENOUS
  Filled 2013-09-20: qty 35

## 2013-09-20 MED ORDER — SODIUM CHLORIDE 0.9 % IV SOLN
560.0000 mg/m2 | Freq: Once | INTRAVENOUS | Status: AC
  Administered 2013-09-20: 1254 mg via INTRAVENOUS
  Filled 2013-09-20: qty 32.98

## 2013-09-20 MED ORDER — ONDANSETRON 8 MG/NS 50 ML IVPB
INTRAVENOUS | Status: AC
Start: 2013-09-20 — End: 2013-09-20
  Filled 2013-09-20: qty 8

## 2013-09-20 MED ORDER — HEPARIN SOD (PORK) LOCK FLUSH 100 UNIT/ML IV SOLN
500.0000 [IU] | Freq: Once | INTRAVENOUS | Status: AC | PRN
  Administered 2013-09-20: 500 [IU]
  Filled 2013-09-20: qty 5

## 2013-09-20 MED ORDER — SODIUM CHLORIDE 0.9 % IJ SOLN
10.0000 mL | INTRAMUSCULAR | Status: DC | PRN
  Administered 2013-09-20: 10 mL
  Filled 2013-09-20: qty 10

## 2013-09-20 MED ORDER — HEPARIN SOD (PORK) LOCK FLUSH 100 UNIT/ML IV SOLN
500.0000 [IU] | Freq: Once | INTRAVENOUS | Status: AC
  Administered 2013-09-20: 500 [IU] via INTRAVENOUS
  Filled 2013-09-20: qty 5

## 2013-09-20 MED ORDER — ONDANSETRON 8 MG/NS 50 ML IVPB
INTRAVENOUS | Status: AC
  Filled 2013-09-20: qty 8

## 2013-09-20 NOTE — Patient Instructions (Signed)
[facility] Discharge Instructions for Patients Receiving Chemotherapy  Today you received the following chemotherapy agents Gemzar and Abraxane.  To help prevent nausea and vomiting after your treatment, we encourage you to take your nausea medication.   If you develop nausea and vomiting that is not controlled by your nausea medication, call the clinic.   BELOW ARE SYMPTOMS THAT SHOULD BE REPORTED IMMEDIATELY:  *FEVER GREATER THAN 100.5 F  *CHILLS WITH OR WITHOUT FEVER  NAUSEA AND VOMITING THAT IS NOT CONTROLLED WITH YOUR NAUSEA MEDICATION  *UNUSUAL SHORTNESS OF BREATH  *UNUSUAL BRUISING OR BLEEDING  TENDERNESS IN MOUTH AND THROAT WITH OR WITHOUT PRESENCE OF ULCERS  *URINARY PROBLEMS  *BOWEL PROBLEMS  UNUSUAL RASH Items with * indicate a potential emergency and should be followed up as soon as possible.  Feel free to call the clinic you have any questions or concerns. The clinic phone number is [phone].    

## 2013-09-20 NOTE — Patient Instructions (Signed)

## 2013-09-20 NOTE — Progress Notes (Signed)
Brief nutrition followup completed with patient and wife, during chemotherapy.  Patient is requesting samples of oral nutrition supplements including a protein powder supplement.  Patient complains of taste alterations.  Weight has decreased and was documented as 214 pounds, down from 220 pounds in January 2015.  Provided education for patient and wife on improving taste alterations.  Also provided patient with oral nutrition supplement samples, and coupons.  Encouraged increased intake for weight maintenance.  Teach back method used.

## 2013-09-27 ENCOUNTER — Ambulatory Visit: Payer: Medicare Other | Admitting: Nutrition

## 2013-09-27 ENCOUNTER — Encounter: Payer: Self-pay | Admitting: Oncology

## 2013-09-27 ENCOUNTER — Ambulatory Visit (HOSPITAL_BASED_OUTPATIENT_CLINIC_OR_DEPARTMENT_OTHER): Payer: Medicare Other

## 2013-09-27 VITALS — BP 110/70 | HR 66 | Temp 97.0°F | Resp 14

## 2013-09-27 VITALS — BP 126/72 | HR 69 | Temp 99.1°F

## 2013-09-27 DIAGNOSIS — C252 Malignant neoplasm of tail of pancreas: Secondary | ICD-10-CM

## 2013-09-27 DIAGNOSIS — Z95828 Presence of other vascular implants and grafts: Secondary | ICD-10-CM

## 2013-09-27 DIAGNOSIS — Z5111 Encounter for antineoplastic chemotherapy: Secondary | ICD-10-CM

## 2013-09-27 DIAGNOSIS — C787 Secondary malignant neoplasm of liver and intrahepatic bile duct: Secondary | ICD-10-CM

## 2013-09-27 DIAGNOSIS — C259 Malignant neoplasm of pancreas, unspecified: Secondary | ICD-10-CM

## 2013-09-27 LAB — CBC WITH DIFFERENTIAL/PLATELET
BASO%: 1.4 % (ref 0.0–2.0)
Basophils Absolute: 0 10*3/uL (ref 0.0–0.1)
EOS ABS: 0 10*3/uL (ref 0.0–0.5)
EOS%: 0.9 % (ref 0.0–7.0)
HCT: 29.3 % — ABNORMAL LOW (ref 38.4–49.9)
HEMOGLOBIN: 9.5 g/dL — AB (ref 13.0–17.1)
LYMPH%: 25.6 % (ref 14.0–49.0)
MCH: 28.9 pg (ref 27.2–33.4)
MCHC: 32.4 g/dL (ref 32.0–36.0)
MCV: 89.1 fL (ref 79.3–98.0)
MONO#: 0.3 10*3/uL (ref 0.1–0.9)
MONO%: 11.8 % (ref 0.0–14.0)
NEUT%: 60.3 % (ref 39.0–75.0)
NEUTROS ABS: 1.3 10*3/uL — AB (ref 1.5–6.5)
PLATELETS: 156 10*3/uL (ref 140–400)
RBC: 3.29 10*6/uL — AB (ref 4.20–5.82)
RDW: 16.4 % — ABNORMAL HIGH (ref 11.0–14.6)
WBC: 2.1 10*3/uL — ABNORMAL LOW (ref 4.0–10.3)
lymph#: 0.5 10*3/uL — ABNORMAL LOW (ref 0.9–3.3)
nRBC: 1 % — ABNORMAL HIGH (ref 0–0)

## 2013-09-27 MED ORDER — ONDANSETRON 8 MG/50ML IVPB (CHCC)
8.0000 mg | Freq: Once | INTRAVENOUS | Status: AC
  Administered 2013-09-27: 8 mg via INTRAVENOUS

## 2013-09-27 MED ORDER — SODIUM CHLORIDE 0.9 % IJ SOLN
10.0000 mL | INTRAMUSCULAR | Status: DC | PRN
  Administered 2013-09-27: 10 mL
  Filled 2013-09-27: qty 10

## 2013-09-27 MED ORDER — ONDANSETRON 8 MG/NS 50 ML IVPB
INTRAVENOUS | Status: AC
  Filled 2013-09-27: qty 8

## 2013-09-27 MED ORDER — SODIUM CHLORIDE 0.9 % IV SOLN
Freq: Once | INTRAVENOUS | Status: AC
  Administered 2013-09-27: 10:00:00 via INTRAVENOUS

## 2013-09-27 MED ORDER — DEXAMETHASONE SODIUM PHOSPHATE 10 MG/ML IJ SOLN
INTRAMUSCULAR | Status: AC
  Filled 2013-09-27: qty 1

## 2013-09-27 MED ORDER — PACLITAXEL PROTEIN-BOUND CHEMO INJECTION 100 MG
80.0000 mg/m2 | Freq: Once | INTRAVENOUS | Status: AC
  Administered 2013-09-27: 175 mg via INTRAVENOUS
  Filled 2013-09-27: qty 35

## 2013-09-27 MED ORDER — SODIUM CHLORIDE 0.9 % IV SOLN
560.0000 mg/m2 | Freq: Once | INTRAVENOUS | Status: AC
  Administered 2013-09-27: 1254 mg via INTRAVENOUS
  Filled 2013-09-27: qty 32.98

## 2013-09-27 MED ORDER — DEXAMETHASONE SODIUM PHOSPHATE 10 MG/ML IJ SOLN
5.0000 mg | Freq: Once | INTRAMUSCULAR | Status: AC
  Administered 2013-09-27: 5 mg via INTRAVENOUS

## 2013-09-27 MED ORDER — SODIUM CHLORIDE 0.9 % IJ SOLN
10.0000 mL | INTRAMUSCULAR | Status: DC | PRN
  Administered 2013-09-27: 10 mL via INTRAVENOUS
  Filled 2013-09-27: qty 10

## 2013-09-27 MED ORDER — HEPARIN SOD (PORK) LOCK FLUSH 100 UNIT/ML IV SOLN
500.0000 [IU] | Freq: Once | INTRAVENOUS | Status: AC | PRN
  Administered 2013-09-27: 500 [IU]
  Filled 2013-09-27: qty 5

## 2013-09-27 NOTE — Patient Instructions (Signed)
[facility] Discharge Instructions for Patients Receiving Chemotherapy  Today you received the following chemotherapy agents: Abraxane, Gemzar   To help prevent nausea and vomiting after your treatment, we encourage you to take your nausea medication as prescribed.    If you develop nausea and vomiting that is not controlled by your nausea medication, call the clinic.   BELOW ARE SYMPTOMS THAT SHOULD BE REPORTED IMMEDIATELY:  *FEVER GREATER THAN 100.5 F  *CHILLS WITH OR WITHOUT FEVER  NAUSEA AND VOMITING THAT IS NOT CONTROLLED WITH YOUR NAUSEA MEDICATION  *UNUSUAL SHORTNESS OF BREATH  *UNUSUAL BRUISING OR BLEEDING  TENDERNESS IN MOUTH AND THROAT WITH OR WITHOUT PRESENCE OF ULCERS  *URINARY PROBLEMS  *BOWEL PROBLEMS  UNUSUAL RASH Items with * indicate a potential emergency and should be followed up as soon as possible.  Feel free to call the clinic you have any questions or concerns. The clinic phone number is [phone].  Neutropenia Neutropenia is a condition that occurs when the level of a certain type of white blood cell (neutrophil) in your body becomes lower than normal. Neutrophils are made in the bone marrow and fight infections. These cells protect against bacteria and viruses. The fewer neutrophils you have, and the longer your body remains without them, the greater your risk of getting a severe infection becomes. CAUSES  The cause of neutropenia may be hard to determine. However, it is usually due to 3 main problems:   Decreased production of neutrophils. This may be due to:  Certain medicines such as chemotherapy.  Genetic problems.  Cancer.  Radiation treatments.  Vitamin deficiency.  Some pesticides.  Increased destruction of neutrophils. This may be due to:  Overwhelming infections.  Hemolytic anemia. This is when the body destroys its own blood cells.  Chemotherapy.  Neutrophils moving to areas of the body where they cannot  fight infections. This may be due to:  Dialysis procedures.  Conditions where the spleen becomes enlarged. Neutrophils are held in the spleen and are not available to the rest of the body.  Overwhelming infections. The neutrophils are held in the area of the infection and are not available to the rest of the body. SYMPTOMS  There are no specific symptoms of neutropenia. The lack of neutrophils can result in an infection, and an infection can cause various problems. DIAGNOSIS  Diagnosis is made by a blood test. A complete blood count is performed. The normal level of neutrophils in human blood differs with age and race. Infants have lower counts than older children and adults. African Americans have lower counts than Caucasians or Asians. The average adult level is 1500 cells/mm3 of blood. Neutrophil counts are interpreted as follows:  Greater than 1000 cells/mm3 gives normal protection against infection.  500 to 1000 cells/mm3 gives an increased risk for infection.  200 to 500 cells/mm3 is a greater risk for severe infection.  Lower than 200 cells/mm3 is a marked risk of infection. This may require hospitalization and treatment with antibiotic medicines. TREATMENT  Treatment depends on the underlying cause, severity, and presence of infections or symptoms. It also depends on your health. Your caregiver will discuss the treatment plan with you. Mild cases are often easily treated and have a good outcome. Preventative measures may also be started to limit your risk of infections. Treatment can include:  Taking antibiotics.  Stopping medicines that are known to cause neutropenia.  Correcting nutritional deficiencies by eating green vegetables to supply folic acid and taking vitamin B supplements.  Stopping exposure to pesticides if your neutropenia is related to pesticide exposure.  Taking a blood growth factor called sargramostim, pegfilgrastim, or filgrastim if you are undergoing  chemotherapy for cancer. This stimulates white blood cell production.  Removal of the spleen if you have Felty's syndrome and have repeated infections. HOME CARE INSTRUCTIONS   Follow your caregiver's instructions about when you need to have blood work done.  Wash your hands often. Make sure others who come in contact with you also wash their hands.  Wash raw fruits and vegetables before eating them. They can carry bacteria and fungi.  Avoid people with colds or spreadable (contagious) diseases (chickenpox, herpes zoster, influenza).  Avoid large crowds.  Avoid construction areas. The dust can release fungus into the air.  Be cautious around children in daycare or school environments.  Take care of your respiratory system by coughing and deep breathing.  Bathe daily.  Protect your skin from cuts and burns.  Do not work in the garden or with flowers and plants.  Care for the mouth before and after meals by brushing with a soft toothbrush. If you have mucositis, do not use mouthwash. Mouthwash contains alcohol and can dry out the mouth even more.  Clean the area between the genitals and the anus (perineal area) after urination and bowel movements. Women need to wipe from front to back.  Use a water soluble lubricant during sexual intercourse and practice good hygiene after. Do not have intercourse if you are severely neutropenic. Check with your caregiver for guidelines.  Exercise daily as tolerated.  Avoid people who were vaccinated with a live vaccine in the past 30 days. You should not receive live vaccines (polio, typhoid).  Do not provide direct care for pets. Avoid animal droppings. Do not clean litter boxes and bird cages.  Do not share food utensils.  Do not use tampons, enemas, or rectal suppositories unless directed by your caregiver.  Use an electric razor to remove hair.  Wash your hands after handling magazines, letters, and newspapers. SEEK IMMEDIATE MEDICAL  CARE IF:   You have a fever.  You have chills or start to shake.  You feel nauseous or vomit.  You develop mouth sores.  You develop aches and pains.  You have redness and swelling around open wounds.  Your skin is warm to the touch.  You have pus coming from your wounds.  You develop swollen lymph nodes.  You feel weak or fatigued.  You develop red streaks on the skin. MAKE SURE YOU:  Understand these instructions.  Will watch your condition.  Will get help right away if you are not doing well or get worse. Document Released: 11/21/2001 Document Revised: 08/24/2011 Document Reviewed: 12/19/2010 [facility] Patient Information 2014 [place], [place].

## 2013-09-27 NOTE — Progress Notes (Signed)
Followup completed with patient and wife in chemotherapy.  Patient denies change in eating.  Wife reports she feels patient is eating less than he did before.  Weight has decreased slightly and was documented as 214.7 pounds on April 1.  Down from 220 pounds January 2015.  Patient enjoys boost plus.  Nutrition diagnosis: Unintended weight loss related to inadequate oral intake as evidenced by 5 pound weight loss over 3 months.  Intervention: Patient educated to increase boost + to twice a day between meals for additional calories and protein.  Provided coupons for patient.  Teach back method used.  Monitoring, evaluation, goals: Patient will tolerate oral nutrition supplements twice a day to minimize further weight loss.  Next visit: Wednesday, April 29, during chemotherapy.

## 2013-09-27 NOTE — Progress Notes (Signed)
Okay to treat today per Dr. Learta Codding with [facility] 1.3 and okay to use CMET results from 09/20/13. Cindi Carbon, RN

## 2013-09-27 NOTE — Progress Notes (Signed)
[facility]zar and abraxane-

## 2013-09-27 NOTE — Patient Instructions (Signed)

## 2013-10-08 ENCOUNTER — Other Ambulatory Visit: Payer: Self-pay | Admitting: Oncology

## 2013-10-09 ENCOUNTER — Encounter: Payer: Self-pay | Admitting: Oncology

## 2013-10-09 NOTE — Progress Notes (Signed)
Per [facility] dates of services that are more than 60days prior to approval date of 08/30/13 can not be paid by them.

## 2013-10-11 ENCOUNTER — Ambulatory Visit (HOSPITAL_BASED_OUTPATIENT_CLINIC_OR_DEPARTMENT_OTHER): Payer: Medicare Other

## 2013-10-11 ENCOUNTER — Ambulatory Visit: Payer: Medicare Other | Admitting: Nutrition

## 2013-10-11 ENCOUNTER — Telehealth: Payer: Self-pay | Admitting: Oncology

## 2013-10-11 ENCOUNTER — Ambulatory Visit (HOSPITAL_BASED_OUTPATIENT_CLINIC_OR_DEPARTMENT_OTHER): Payer: Medicare Other | Admitting: Oncology

## 2013-10-11 VITALS — BP 122/82 | HR 76 | Temp 97.7°F | Resp 18 | Ht 73.0 in | Wt 216.6 lb

## 2013-10-11 DIAGNOSIS — C259 Malignant neoplasm of pancreas, unspecified: Secondary | ICD-10-CM

## 2013-10-11 DIAGNOSIS — C787 Secondary malignant neoplasm of liver and intrahepatic bile duct: Secondary | ICD-10-CM

## 2013-10-11 DIAGNOSIS — C252 Malignant neoplasm of tail of pancreas: Secondary | ICD-10-CM

## 2013-10-11 DIAGNOSIS — F329 Major depressive disorder, single episode, unspecified: Secondary | ICD-10-CM

## 2013-10-11 DIAGNOSIS — Z95828 Presence of other vascular implants and grafts: Secondary | ICD-10-CM

## 2013-10-11 DIAGNOSIS — Z5111 Encounter for antineoplastic chemotherapy: Secondary | ICD-10-CM

## 2013-10-11 DIAGNOSIS — G893 Neoplasm related pain (acute) (chronic): Secondary | ICD-10-CM

## 2013-10-11 DIAGNOSIS — G609 Hereditary and idiopathic neuropathy, unspecified: Secondary | ICD-10-CM

## 2013-10-11 DIAGNOSIS — F3289 Other specified depressive episodes: Secondary | ICD-10-CM

## 2013-10-11 LAB — CBC WITH DIFFERENTIAL/PLATELET
BASO%: 0.5 % (ref 0.0–2.0)
Basophils Absolute: 0 10*3/uL (ref 0.0–0.1)
EOS ABS: 0.1 10*3/uL (ref 0.0–0.5)
EOS%: 1.8 % (ref 0.0–7.0)
HCT: 29.7 % — ABNORMAL LOW (ref 38.4–49.9)
HGB: 9.6 g/dL — ABNORMAL LOW (ref 13.0–17.1)
LYMPH#: 0.8 10*3/uL — AB (ref 0.9–3.3)
LYMPH%: 17.2 % (ref 14.0–49.0)
MCH: 29 pg (ref 27.2–33.4)
MCHC: 32.3 g/dL (ref 32.0–36.0)
MCV: 89.7 fL (ref 79.3–98.0)
MONO#: 0.5 10*3/uL (ref 0.1–0.9)
MONO%: 12.2 % (ref 0.0–14.0)
NEUT%: 68.3 % (ref 39.0–75.0)
NEUTROS ABS: 3 10*3/uL (ref 1.5–6.5)
Platelets: 153 10*3/uL (ref 140–400)
RBC: 3.31 10*6/uL — ABNORMAL LOW (ref 4.20–5.82)
RDW: 17.1 % — AB (ref 11.0–14.6)
WBC: 4.4 10*3/uL (ref 4.0–10.3)

## 2013-10-11 LAB — COMPREHENSIVE METABOLIC PANEL (CC13)
ALBUMIN: 3.5 g/dL (ref 3.5–5.0)
ALT: 12 U/L (ref 0–55)
AST: 17 U/L (ref 5–34)
Alkaline Phosphatase: 48 U/L (ref 40–150)
Anion Gap: 13 mEq/L — ABNORMAL HIGH (ref 3–11)
BUN: 13.5 mg/dL (ref 7.0–26.0)
CHLORIDE: 105 meq/L (ref 98–109)
CO2: 22 mEq/L (ref 22–29)
Calcium: 9.6 mg/dL (ref 8.4–10.4)
Creatinine: 1.4 mg/dL — ABNORMAL HIGH (ref 0.7–1.3)
Glucose: 230 mg/dl — ABNORMAL HIGH (ref 70–140)
POTASSIUM: 3.9 meq/L (ref 3.5–5.1)
SODIUM: 141 meq/L (ref 136–145)
TOTAL PROTEIN: 6.5 g/dL (ref 6.4–8.3)
Total Bilirubin: 0.45 mg/dL (ref 0.20–1.20)

## 2013-10-11 MED ORDER — PACLITAXEL PROTEIN-BOUND CHEMO INJECTION 100 MG
80.0000 mg/m2 | Freq: Once | INTRAVENOUS | Status: AC
  Administered 2013-10-11: 175 mg via INTRAVENOUS
  Filled 2013-10-11: qty 35

## 2013-10-11 MED ORDER — ONDANSETRON 8 MG/50ML IVPB (CHCC)
8.0000 mg | Freq: Once | INTRAVENOUS | Status: AC
  Administered 2013-10-11: 8 mg via INTRAVENOUS

## 2013-10-11 MED ORDER — SODIUM CHLORIDE 0.9 % IJ SOLN
10.0000 mL | INTRAMUSCULAR | Status: DC | PRN
Start: 2013-10-11 — End: 2016-11-11
  Administered 2013-10-11: 10 mL via INTRAVENOUS
  Filled 2013-10-11: qty 10

## 2013-10-11 MED ORDER — HEPARIN SOD (PORK) LOCK FLUSH 100 UNIT/ML IV SOLN
500.0000 [IU] | Freq: Once | INTRAVENOUS | Status: AC | PRN
  Administered 2013-10-11: 500 [IU]
  Filled 2013-10-11: qty 5

## 2013-10-11 MED ORDER — HEPARIN SOD (PORK) LOCK FLUSH 100 UNIT/ML IV SOLN
500.0000 [IU] | Freq: Once | INTRAVENOUS | Status: AC
  Administered 2013-10-11: 500 [IU] via INTRAVENOUS
  Filled 2013-10-11: qty 5

## 2013-10-11 MED ORDER — SODIUM CHLORIDE 0.9 % IV SOLN
Freq: Once | INTRAVENOUS | Status: AC
  Administered 2013-10-11: 10:00:00 via INTRAVENOUS

## 2013-10-11 MED ORDER — DEXAMETHASONE SODIUM PHOSPHATE 10 MG/ML IJ SOLN
5.0000 mg | Freq: Once | INTRAMUSCULAR | Status: AC
  Administered 2013-10-11: 5 mg via INTRAVENOUS

## 2013-10-11 MED ORDER — DEXAMETHASONE SODIUM PHOSPHATE 10 MG/ML IJ SOLN
INTRAMUSCULAR | Status: AC
  Filled 2013-10-11: qty 1

## 2013-10-11 MED ORDER — ONDANSETRON 8 MG/NS 50 ML IVPB
INTRAVENOUS | Status: AC
  Filled 2013-10-11: qty 8

## 2013-10-11 MED ORDER — SODIUM CHLORIDE 0.9 % IV SOLN
560.0000 mg/m2 | Freq: Once | INTRAVENOUS | Status: AC
  Administered 2013-10-11: 1254 mg via INTRAVENOUS
  Filled 2013-10-11: qty 32.98

## 2013-10-11 MED ORDER — SODIUM CHLORIDE 0.9 % IJ SOLN
10.0000 mL | INTRAMUSCULAR | Status: DC | PRN
  Administered 2013-10-11: 10 mL
  Filled 2013-10-11: qty 10

## 2013-10-11 NOTE — Progress Notes (Signed)
Patient denies nutrition side effects.  He is eating well and has a good appetite with good energy levels.  Weight is increased to 216.6 pounds on April 29.  Patient received good news from MD that there is no clinical evidence of disease progression.  Wife states he will be changing to monthly chemotherapy.  Nutrition diagnosis of unintended weight loss resolved.  Encouraged patient to contact me if he develops any nutrition issues.  He has my contact information for questions and concerns.

## 2013-10-11 NOTE — Progress Notes (Signed)
  [facility] OFFICE PROGRESS NOTE   Diagnosis: Pancreas Cancer  INTERVAL HISTORY:   Christian Kaiser returns as scheduled. Christian Kaiser continues treatment with gemcitabine/Abraxane, last given 09/27/2013. No pain. Good appetite and energy level. Christian Kaiser notes a mild "slick "feeling at the fingertips. This does not interfere with activity. Christian Kaiser reports the blood sugar is under good control.  Objective:  Vital signs in last 24 hours:  Blood pressure 122/82, pulse 76, temperature 97.7 F (36.5 C), temperature source Oral, resp. rate 18, height 6\' 1"  (1.854 m), weight 216 lb 9.6 oz (98.249 kg).    HEENT: No thrush or ulcers Resp: Lungs clear bilaterally Cardio: Regular rate and rhythm GI: No hepatomegaly, nontender, no mass Vascular: No leg edema Neuro: Mild to moderate decrease in vibratory sense at the fingertips bilaterally    Portacath/PICC-without erythema  Lab Results:  Lab Results  Component Value Date   WBC 4.4 10/11/2013   HGB 9.6* 10/11/2013   HCT 29.7* 10/11/2013   MCV 89.7 10/11/2013   PLT 153 10/11/2013   NEUTROABS 3.0 10/11/2013     Medications: I have reviewed the patient's current medications.  Assessment/Plan: 1. Adenocarcinoma of the pancreas status post ultrasound-guided biopsy of a liver lesion 10/18/2012 consistent with metastatic pancreas cancer. CT 10/11/2012 revealed a pancreas tail mass and liver metastases. CA 19-9 in normal range on 10/24/2012. Initiation of gemcitabine/Abraxane on a day 1, day 8 day 15 schedule on 10/28/2012. Current schedule is day one/day 8.  Restaging CT 01/26/2013 revealed a decrease in the size of liver metastases and no new lesions  Gemcitabine/Abraxane continued on a day 1, day 8 schedule.  Restaging CT 04/25/2013 with a decrease in the size of the pancreas mass and liver lesions.  Gemcitabine/Abraxane continued on a day 1, day 8 schedule.  Restaging CT 08/08/2013-further regression of the pancreas mass, stable liver lesions.    Gemcitabine/Abraxane continued on a day 1, day 8 schedule. Gemcitabine/Abraxane  Changed to an every 2 week schedule beginning 10/11/2013  2. Abdominal pain secondary to the pancreas mass. Improved. 3. Anorexia/weight loss. Improved. 4. Diabetes. Exacerbated by steroid premedication. Decadron was reduced to 5 mg after cycle 1 day 1. 5. History of coronary artery disease. 6. Headache following cycle 1 day 1 gemcitabine/Abraxane. Question related to Zofran.  7. History of thrombocytopenia secondary to chemotherapy. 8. Mild to moderate loss of vibratory sense in the fingertips. Question secondary to Abraxane versus diabetes. Not interfering with activity at present. 9. Depression-Christian Kaiser declined an antidepressant. Counseling services were offered as well.   Disposition:   Christian Kaiser appears stable. Christian Kaiser has been maintained on gemcitabine/Abraxane since May of 2014. There is no clinical evidence of disease progression. The plan is to continue gemcitabine/Abraxane every 2 weeks. Christian Kaiser will return for an office visit in one month.  We will plan for a restaging CT evaluation within the next 2-3 months.  Ladell Pier, MD  10/11/2013  9:59 AM

## 2013-10-11 NOTE — Patient Instructions (Signed)

## 2013-10-11 NOTE — Telephone Encounter (Signed)
gv and printed appt sched and avs forpt for May...sed added tx. °

## 2013-10-11 NOTE — Patient Instructions (Addendum)
[facility] Discharge Instructions for Patients Receiving Chemotherapy  Today you received the following chemotherapy agents: Gemzar, Abraxane  To help prevent nausea and vomiting after your treatment, we encourage you to take your nausea medication as prescribed.    If you develop nausea and vomiting that is not controlled by your nausea medication, call the clinic.   BELOW ARE SYMPTOMS THAT SHOULD BE REPORTED IMMEDIATELY:  *FEVER GREATER THAN 100.5 F  *CHILLS WITH OR WITHOUT FEVER  NAUSEA AND VOMITING THAT IS NOT CONTROLLED WITH YOUR NAUSEA MEDICATION  *UNUSUAL SHORTNESS OF BREATH  *UNUSUAL BRUISING OR BLEEDING  TENDERNESS IN MOUTH AND THROAT WITH OR WITHOUT PRESENCE OF ULCERS  *URINARY PROBLEMS  *BOWEL PROBLEMS  UNUSUAL RASH Items with * indicate a potential emergency and should be followed up as soon as possible.  Feel free to call the clinic you have any questions or concerns. The clinic phone number is [phone].

## 2013-10-13 ENCOUNTER — Encounter: Payer: Self-pay | Admitting: Oncology

## 2013-10-13 NOTE — Progress Notes (Signed)
[facility] not paying date of service in 70 old.Marland KitchenMarland Kitchen

## 2013-10-18 ENCOUNTER — Ambulatory Visit: Payer: Medicare Other

## 2013-10-18 ENCOUNTER — Other Ambulatory Visit: Payer: Medicare Other

## 2013-10-22 ENCOUNTER — Other Ambulatory Visit: Payer: Self-pay | Admitting: Oncology

## 2013-10-25 ENCOUNTER — Other Ambulatory Visit (HOSPITAL_BASED_OUTPATIENT_CLINIC_OR_DEPARTMENT_OTHER): Payer: Medicare Other

## 2013-10-25 ENCOUNTER — Encounter: Payer: Self-pay | Admitting: Oncology

## 2013-10-25 ENCOUNTER — Ambulatory Visit (HOSPITAL_BASED_OUTPATIENT_CLINIC_OR_DEPARTMENT_OTHER): Payer: Medicare Other

## 2013-10-25 VITALS — BP 143/66 | HR 71 | Temp 97.7°F | Resp 16

## 2013-10-25 DIAGNOSIS — Z5111 Encounter for antineoplastic chemotherapy: Secondary | ICD-10-CM

## 2013-10-25 DIAGNOSIS — C787 Secondary malignant neoplasm of liver and intrahepatic bile duct: Secondary | ICD-10-CM

## 2013-10-25 DIAGNOSIS — C252 Malignant neoplasm of tail of pancreas: Secondary | ICD-10-CM

## 2013-10-25 DIAGNOSIS — C259 Malignant neoplasm of pancreas, unspecified: Secondary | ICD-10-CM

## 2013-10-25 LAB — CBC WITH DIFFERENTIAL/PLATELET
BASO%: 0.6 % (ref 0.0–2.0)
BASOS ABS: 0 10*3/uL (ref 0.0–0.1)
EOS%: 2.5 % (ref 0.0–7.0)
Eosinophils Absolute: 0.1 10*3/uL (ref 0.0–0.5)
HEMATOCRIT: 30.8 % — AB (ref 38.4–49.9)
HEMOGLOBIN: 10 g/dL — AB (ref 13.0–17.1)
LYMPH%: 18.2 % (ref 14.0–49.0)
MCH: 29.2 pg (ref 27.2–33.4)
MCHC: 32.5 g/dL (ref 32.0–36.0)
MCV: 90.1 fL (ref 79.3–98.0)
MONO#: 0.7 10*3/uL (ref 0.1–0.9)
MONO%: 13.4 % (ref 0.0–14.0)
NEUT#: 3.2 10*3/uL (ref 1.5–6.5)
NEUT%: 65.3 % (ref 39.0–75.0)
Platelets: 96 10*3/uL — ABNORMAL LOW (ref 140–400)
RBC: 3.42 10*6/uL — ABNORMAL LOW (ref 4.20–5.82)
RDW: 16.6 % — ABNORMAL HIGH (ref 11.0–14.6)
WBC: 4.8 10*3/uL (ref 4.0–10.3)
lymph#: 0.9 10*3/uL (ref 0.9–3.3)

## 2013-10-25 LAB — COMPREHENSIVE METABOLIC PANEL (CC13)
ALK PHOS: 47 U/L (ref 40–150)
ALT: 13 U/L (ref 0–55)
AST: 15 U/L (ref 5–34)
Albumin: 3.5 g/dL (ref 3.5–5.0)
Anion Gap: 10 mEq/L (ref 3–11)
BILIRUBIN TOTAL: 0.42 mg/dL (ref 0.20–1.20)
BUN: 15.9 mg/dL (ref 7.0–26.0)
CO2: 22 mEq/L (ref 22–29)
CREATININE: 1.4 mg/dL — AB (ref 0.7–1.3)
Calcium: 9.3 mg/dL (ref 8.4–10.4)
Chloride: 108 mEq/L (ref 98–109)
Glucose: 331 mg/dl — ABNORMAL HIGH (ref 70–140)
Potassium: 4.5 mEq/L (ref 3.5–5.1)
Sodium: 140 mEq/L (ref 136–145)
Total Protein: 6.5 g/dL (ref 6.4–8.3)

## 2013-10-25 MED ORDER — ONDANSETRON 8 MG/NS 50 ML IVPB
INTRAVENOUS | Status: AC
  Filled 2013-10-25: qty 8

## 2013-10-25 MED ORDER — HEPARIN SOD (PORK) LOCK FLUSH 100 UNIT/ML IV SOLN
500.0000 [IU] | Freq: Once | INTRAVENOUS | Status: AC | PRN
  Administered 2013-10-25: 500 [IU]
  Filled 2013-10-25: qty 5

## 2013-10-25 MED ORDER — ONDANSETRON 8 MG/50ML IVPB (CHCC)
8.0000 mg | Freq: Once | INTRAVENOUS | Status: AC
  Administered 2013-10-25: 8 mg via INTRAVENOUS

## 2013-10-25 MED ORDER — DEXAMETHASONE SODIUM PHOSPHATE 10 MG/ML IJ SOLN
INTRAMUSCULAR | Status: AC
  Filled 2013-10-25: qty 1

## 2013-10-25 MED ORDER — SODIUM CHLORIDE 0.9 % IV SOLN
560.0000 mg/m2 | Freq: Once | INTRAVENOUS | Status: AC
  Administered 2013-10-25: 1254 mg via INTRAVENOUS
  Filled 2013-10-25: qty 32.98

## 2013-10-25 MED ORDER — SODIUM CHLORIDE 0.9 % IJ SOLN
10.0000 mL | INTRAMUSCULAR | Status: DC | PRN
  Administered 2013-10-25: 10 mL
  Filled 2013-10-25: qty 10

## 2013-10-25 MED ORDER — SODIUM CHLORIDE 0.9 % IV SOLN
Freq: Once | INTRAVENOUS | Status: AC
  Administered 2013-10-25: 10:00:00 via INTRAVENOUS

## 2013-10-25 MED ORDER — DEXAMETHASONE SODIUM PHOSPHATE 10 MG/ML IJ SOLN
5.0000 mg | Freq: Once | INTRAMUSCULAR | Status: AC
  Administered 2013-10-25: 5 mg via INTRAVENOUS

## 2013-10-25 MED ORDER — PACLITAXEL PROTEIN-BOUND CHEMO INJECTION 100 MG
80.0000 mg/m2 | Freq: Once | INTRAVENOUS | Status: AC
  Administered 2013-10-25: 175 mg via INTRAVENOUS
  Filled 2013-10-25: qty 35

## 2013-10-25 NOTE — Progress Notes (Signed)
Discussed labs with Dr. Benay Spice.  OK to treat with CMET results from 10/11/13 and CBC today with platelets of 96K.

## 2013-10-25 NOTE — Progress Notes (Signed)
Patient bought in bills. I sent to billing to make sure Gemzar and Abraxane had been sent for processing. I advised the patient going forward make sure he calls billing when he gets bill to advise he needs is asst applied. He also said he got a bill from a collection agency and they feel they had paid. I advised him to make sure he gets proof of his pmt to them.

## 2013-10-25 NOTE — Patient Instructions (Signed)
[place] Discharge Instructions for Patients Receiving Chemotherapy  Today you received the following chemotherapy agents abraxane and gemcitibine.  To help prevent nausea and vomiting after your treatment, we encourage you to take your nausea medication compazine.   If you develop nausea and vomiting that is not controlled by your nausea medication, call the clinic.   BELOW ARE SYMPTOMS THAT SHOULD BE REPORTED IMMEDIATELY:  *FEVER GREATER THAN 100.5 F  *CHILLS WITH OR WITHOUT FEVER  NAUSEA AND VOMITING THAT IS NOT CONTROLLED WITH YOUR NAUSEA MEDICATION  *UNUSUAL SHORTNESS OF BREATH  *UNUSUAL BRUISING OR BLEEDING  TENDERNESS IN MOUTH AND THROAT WITH OR WITHOUT PRESENCE OF ULCERS  *URINARY PROBLEMS  *BOWEL PROBLEMS  UNUSUAL RASH Items with * indicate a potential emergency and should be followed up as soon as possible.  Feel free to call the clinic you have any questions or concerns. The clinic phone number is [phone].

## 2013-11-06 ENCOUNTER — Other Ambulatory Visit: Payer: Self-pay | Admitting: Oncology

## 2013-11-08 ENCOUNTER — Ambulatory Visit (HOSPITAL_BASED_OUTPATIENT_CLINIC_OR_DEPARTMENT_OTHER): Payer: Medicare Other

## 2013-11-08 ENCOUNTER — Ambulatory Visit (HOSPITAL_BASED_OUTPATIENT_CLINIC_OR_DEPARTMENT_OTHER): Payer: Medicare Other | Admitting: *Deleted

## 2013-11-08 ENCOUNTER — Ambulatory Visit (HOSPITAL_BASED_OUTPATIENT_CLINIC_OR_DEPARTMENT_OTHER): Payer: Medicare Other | Admitting: Nurse Practitioner

## 2013-11-08 ENCOUNTER — Telehealth: Payer: Self-pay | Admitting: Oncology

## 2013-11-08 VITALS — BP 118/74 | HR 70 | Temp 98.3°F | Resp 18 | Ht 73.0 in | Wt 217.2 lb

## 2013-11-08 DIAGNOSIS — C259 Malignant neoplasm of pancreas, unspecified: Secondary | ICD-10-CM

## 2013-11-08 DIAGNOSIS — Z5111 Encounter for antineoplastic chemotherapy: Secondary | ICD-10-CM

## 2013-11-08 DIAGNOSIS — C787 Secondary malignant neoplasm of liver and intrahepatic bile duct: Secondary | ICD-10-CM

## 2013-11-08 DIAGNOSIS — C252 Malignant neoplasm of tail of pancreas: Secondary | ICD-10-CM

## 2013-11-08 DIAGNOSIS — F3289 Other specified depressive episodes: Secondary | ICD-10-CM

## 2013-11-08 DIAGNOSIS — Z8679 Personal history of other diseases of the circulatory system: Secondary | ICD-10-CM

## 2013-11-08 DIAGNOSIS — F329 Major depressive disorder, single episode, unspecified: Secondary | ICD-10-CM

## 2013-11-08 DIAGNOSIS — R0609 Other forms of dyspnea: Secondary | ICD-10-CM

## 2013-11-08 DIAGNOSIS — R109 Unspecified abdominal pain: Secondary | ICD-10-CM

## 2013-11-08 DIAGNOSIS — Z95828 Presence of other vascular implants and grafts: Secondary | ICD-10-CM

## 2013-11-08 DIAGNOSIS — R634 Abnormal weight loss: Secondary | ICD-10-CM

## 2013-11-08 DIAGNOSIS — R630 Anorexia: Secondary | ICD-10-CM

## 2013-11-08 DIAGNOSIS — E119 Type 2 diabetes mellitus without complications: Secondary | ICD-10-CM

## 2013-11-08 DIAGNOSIS — R0989 Other specified symptoms and signs involving the circulatory and respiratory systems: Secondary | ICD-10-CM

## 2013-11-08 LAB — COMPREHENSIVE METABOLIC PANEL (CC13)
ALT: 15 U/L (ref 0–55)
ANION GAP: 10 meq/L (ref 3–11)
AST: 17 U/L (ref 5–34)
Albumin: 3.6 g/dL (ref 3.5–5.0)
Alkaline Phosphatase: 44 U/L (ref 40–150)
BILIRUBIN TOTAL: 0.52 mg/dL (ref 0.20–1.20)
BUN: 13.6 mg/dL (ref 7.0–26.0)
CO2: 23 meq/L (ref 22–29)
CREATININE: 1.3 mg/dL (ref 0.7–1.3)
Calcium: 8.9 mg/dL (ref 8.4–10.4)
Chloride: 107 mEq/L (ref 98–109)
GLUCOSE: 280 mg/dL — AB (ref 70–140)
Potassium: 4.5 mEq/L (ref 3.5–5.1)
Sodium: 140 mEq/L (ref 136–145)
Total Protein: 6.4 g/dL (ref 6.4–8.3)

## 2013-11-08 LAB — CBC WITH DIFFERENTIAL/PLATELET
BASO%: 0.5 % (ref 0.0–2.0)
BASOS ABS: 0 10*3/uL (ref 0.0–0.1)
EOS ABS: 0.1 10*3/uL (ref 0.0–0.5)
EOS%: 2.8 % (ref 0.0–7.0)
HCT: 29.8 % — ABNORMAL LOW (ref 38.4–49.9)
HGB: 9.7 g/dL — ABNORMAL LOW (ref 13.0–17.1)
LYMPH%: 21 % (ref 14.0–49.0)
MCH: 29.2 pg (ref 27.2–33.4)
MCHC: 32.6 g/dL (ref 32.0–36.0)
MCV: 89.8 fL (ref 79.3–98.0)
MONO#: 0.4 10*3/uL (ref 0.1–0.9)
MONO%: 11 % (ref 0.0–14.0)
NEUT%: 64.7 % (ref 39.0–75.0)
NEUTROS ABS: 2.5 10*3/uL (ref 1.5–6.5)
Platelets: 97 10*3/uL — ABNORMAL LOW (ref 140–400)
RBC: 3.32 10*6/uL — AB (ref 4.20–5.82)
RDW: 16.5 % — AB (ref 11.0–14.6)
WBC: 3.9 10*3/uL — ABNORMAL LOW (ref 4.0–10.3)
lymph#: 0.8 10*3/uL — ABNORMAL LOW (ref 0.9–3.3)

## 2013-11-08 MED ORDER — SODIUM CHLORIDE 0.9 % IV SOLN
Freq: Once | INTRAVENOUS | Status: AC
  Administered 2013-11-08: 11:00:00 via INTRAVENOUS

## 2013-11-08 MED ORDER — GEMCITABINE HCL CHEMO INJECTION 1 GM/26.3ML
560.0000 mg/m2 | Freq: Once | INTRAVENOUS | Status: AC
  Administered 2013-11-08: 1254 mg via INTRAVENOUS
  Filled 2013-11-08: qty 32.98

## 2013-11-08 MED ORDER — DEXAMETHASONE SODIUM PHOSPHATE 10 MG/ML IJ SOLN
5.0000 mg | Freq: Once | INTRAMUSCULAR | Status: AC
  Administered 2013-11-08: 5 mg via INTRAVENOUS

## 2013-11-08 MED ORDER — SODIUM CHLORIDE 0.9 % IJ SOLN
10.0000 mL | INTRAMUSCULAR | Status: DC | PRN
  Administered 2013-11-08: 10 mL
  Filled 2013-11-08: qty 10

## 2013-11-08 MED ORDER — ONDANSETRON 8 MG/NS 50 ML IVPB
INTRAVENOUS | Status: AC
  Filled 2013-11-08: qty 8

## 2013-11-08 MED ORDER — SODIUM CHLORIDE 0.9 % IJ SOLN
10.0000 mL | INTRAMUSCULAR | Status: DC | PRN
  Administered 2013-11-08: 10 mL via INTRAVENOUS
  Filled 2013-11-08: qty 10

## 2013-11-08 MED ORDER — HEPARIN SOD (PORK) LOCK FLUSH 100 UNIT/ML IV SOLN
500.0000 [IU] | Freq: Once | INTRAVENOUS | Status: AC | PRN
  Administered 2013-11-08: 500 [IU]
  Filled 2013-11-08: qty 5

## 2013-11-08 MED ORDER — HEPARIN SOD (PORK) LOCK FLUSH 100 UNIT/ML IV SOLN
500.0000 [IU] | Freq: Once | INTRAVENOUS | Status: AC
  Administered 2013-11-08: 500 [IU] via INTRAVENOUS
  Filled 2013-11-08: qty 5

## 2013-11-08 MED ORDER — PACLITAXEL PROTEIN-BOUND CHEMO INJECTION 100 MG
80.0000 mg/m2 | Freq: Once | INTRAVENOUS | Status: AC
  Administered 2013-11-08: 175 mg via INTRAVENOUS
  Filled 2013-11-08: qty 35

## 2013-11-08 MED ORDER — ONDANSETRON 8 MG/50ML IVPB (CHCC)
8.0000 mg | Freq: Once | INTRAVENOUS | Status: AC
  Administered 2013-11-08: 8 mg via INTRAVENOUS

## 2013-11-08 MED ORDER — DEXAMETHASONE SODIUM PHOSPHATE 10 MG/ML IJ SOLN
INTRAMUSCULAR | Status: AC
  Filled 2013-11-08: qty 1

## 2013-11-08 NOTE — Patient Instructions (Signed)
[facility] Discharge Instructions for Patients Receiving Chemotherapy  Today you received the following chemotherapy agents Abraxane and Gemzar.  To help prevent nausea and vomiting after your treatment, we encourage you to take your nausea medication.   If you develop nausea and vomiting that is not controlled by your nausea medication, call the clinic.   BELOW ARE SYMPTOMS THAT SHOULD BE REPORTED IMMEDIATELY:  *FEVER GREATER THAN 100.5 F  *CHILLS WITH OR WITHOUT FEVER  NAUSEA AND VOMITING THAT IS NOT CONTROLLED WITH YOUR NAUSEA MEDICATION  *UNUSUAL SHORTNESS OF BREATH  *UNUSUAL BRUISING OR BLEEDING  TENDERNESS IN MOUTH AND THROAT WITH OR WITHOUT PRESENCE OF ULCERS  *URINARY PROBLEMS  *BOWEL PROBLEMS  UNUSUAL RASH Items with * indicate a potential emergency and should be followed up as soon as possible.  Feel free to call the clinic you have any questions or concerns. The clinic phone number is [phone].    

## 2013-11-08 NOTE — Telephone Encounter (Signed)
gv pt appt schedule for june.  °

## 2013-11-08 NOTE — Progress Notes (Signed)
  [facility] OFFICE PROGRESS NOTE   Diagnosis:  Pancreas cancer.  INTERVAL HISTORY:   Christian Kaiser returns as scheduled. He continues every 2 week gemcitabine/Abraxane. He denies nausea/vomiting. No mouth sores. No diarrhea. He denies numbness or tingling in his hands or feet. He has a good appetite. He notes that he fatigues easily and overall energy level is poor. He has stable mild dyspnea on exertion. No fever. He has an occasional cough.  Objective:  Vital signs in last 24 hours:  Blood pressure 118/74, pulse 70, temperature 98.3 F (36.8 C), temperature source Oral, resp. rate 18, height 6\' 1"  (1.854 m), weight 217 lb 3.2 oz (98.521 kg).    HEENT: No thrush or ulcerations. Resp: Lungs clear. No wheezes or rales. Cardio: Regular cardiac rhythm. GI: Soft and nontender. No organomegaly. No mass. Vascular: No leg edema. Calves soft and nontender. Neuro: Mild-to-moderate decrease in vibratory sense over the fingertips bilaterally.   Port-A-Cath site without erythema.    Lab Results:  Lab Results  Component Value Date   WBC 3.9* 11/08/2013   HGB 9.7* 11/08/2013   HCT 29.8* 11/08/2013   MCV 89.8 11/08/2013   PLT 97* 11/08/2013   NEUTROABS 2.5 11/08/2013    Imaging:  No results found.  Medications: I have reviewed the patient's current medications.  Assessment/Plan: 1. Adenocarcinoma of the pancreas status post ultrasound-guided biopsy of a liver lesion 10/18/2012 consistent with metastatic pancreas cancer. CT 10/11/2012 revealed a pancreas tail mass and liver metastases. CA 19-9 in normal range on 10/24/2012. Initiation of gemcitabine/Abraxane on a day 1, day 8 day 15 schedule on 10/28/2012. Current schedule is day one/day 8.  Restaging CT 01/26/2013 revealed a decrease in the size of liver metastases and no new lesions  Gemcitabine/Abraxane continued on a day 1, day 8 schedule.  Restaging CT 04/25/2013 with a decrease in the size of the pancreas mass and liver  lesions.  Gemcitabine/Abraxane continued on a day 1, day 8 schedule.  Restaging CT 08/08/2013-further regression of the pancreas mass, stable liver lesions.  Gemcitabine/Abraxane continued on a day 1, day 8 schedule.  Gemcitabine/Abraxane Changed to an every 2 week schedule beginning 10/11/2013.  2. Abdominal pain secondary to the pancreas mass. Improved. 3. Anorexia/weight loss. Improved. 4. Diabetes. Exacerbated by steroid premedication. Decadron was reduced to 5 mg after cycle 1 day 1. 5. History of coronary artery disease. 6. Headache following cycle 1 day 1 gemcitabine/Abraxane. Question related to Zofran.  7. History of thrombocytopenia secondary to chemotherapy. 8. Mild to moderate loss of vibratory sense in the fingertips. Question secondary to Abraxane versus diabetes. Not interfering with activity at present. 9. Depression-he declined an antidepressant. Counseling services were offered as well.   Disposition: He appears stable. Plan to continue gemcitabine/Abraxane on a 2 week schedule. He will return for a followup visit in 4 weeks. He will contact the office in the interim with any problems.  Plan reviewed with Dr. Benay Spice.    Christian Kaiser ANP/GNP-BC   11/08/2013  10:08 AM

## 2013-11-08 NOTE — Progress Notes (Signed)
[place] Per Ned Card NP ok to treat with current lab results.

## 2013-11-19 ENCOUNTER — Other Ambulatory Visit: Payer: Self-pay | Admitting: Oncology

## 2013-11-22 ENCOUNTER — Telehealth: Payer: Self-pay | Admitting: *Deleted

## 2013-11-22 ENCOUNTER — Ambulatory Visit: Payer: Medicare Other

## 2013-11-22 ENCOUNTER — Other Ambulatory Visit (HOSPITAL_BASED_OUTPATIENT_CLINIC_OR_DEPARTMENT_OTHER): Payer: Medicare Other

## 2013-11-22 ENCOUNTER — Ambulatory Visit (HOSPITAL_BASED_OUTPATIENT_CLINIC_OR_DEPARTMENT_OTHER): Payer: Medicare Other

## 2013-11-22 ENCOUNTER — Other Ambulatory Visit: Payer: Self-pay | Admitting: *Deleted

## 2013-11-22 DIAGNOSIS — Z452 Encounter for adjustment and management of vascular access device: Secondary | ICD-10-CM

## 2013-11-22 DIAGNOSIS — C259 Malignant neoplasm of pancreas, unspecified: Secondary | ICD-10-CM

## 2013-11-22 DIAGNOSIS — Z95828 Presence of other vascular implants and grafts: Secondary | ICD-10-CM

## 2013-11-22 LAB — COMPREHENSIVE METABOLIC PANEL (CC13)
ALT: 19 U/L (ref 0–55)
ANION GAP: 8 meq/L (ref 3–11)
AST: 19 U/L (ref 5–34)
Albumin: 3.6 g/dL (ref 3.5–5.0)
Alkaline Phosphatase: 40 U/L (ref 40–150)
BILIRUBIN TOTAL: 0.52 mg/dL (ref 0.20–1.20)
BUN: 18.9 mg/dL (ref 7.0–26.0)
CO2: 22 mEq/L (ref 22–29)
Calcium: 8.7 mg/dL (ref 8.4–10.4)
Chloride: 108 mEq/L (ref 98–109)
Creatinine: 1.5 mg/dL — ABNORMAL HIGH (ref 0.7–1.3)
GLUCOSE: 295 mg/dL — AB (ref 70–140)
Potassium: 4.6 mEq/L (ref 3.5–5.1)
Sodium: 139 mEq/L (ref 136–145)
Total Protein: 6.3 g/dL — ABNORMAL LOW (ref 6.4–8.3)

## 2013-11-22 LAB — CBC WITH DIFFERENTIAL/PLATELET
BASO%: 0.7 % (ref 0.0–2.0)
Basophils Absolute: 0 10*3/uL (ref 0.0–0.1)
EOS ABS: 0.1 10*3/uL (ref 0.0–0.5)
EOS%: 2.2 % (ref 0.0–7.0)
HCT: 31.4 % — ABNORMAL LOW (ref 38.4–49.9)
HEMOGLOBIN: 10.2 g/dL — AB (ref 13.0–17.1)
LYMPH%: 18.4 % (ref 14.0–49.0)
MCH: 29.4 pg (ref 27.2–33.4)
MCHC: 32.3 g/dL (ref 32.0–36.0)
MCV: 90.8 fL (ref 79.3–98.0)
MONO#: 0.4 10*3/uL (ref 0.1–0.9)
MONO%: 10 % (ref 0.0–14.0)
NEUT%: 68.7 % (ref 39.0–75.0)
NEUTROS ABS: 3 10*3/uL (ref 1.5–6.5)
PLATELETS: 75 10*3/uL — AB (ref 140–400)
RBC: 3.46 10*6/uL — ABNORMAL LOW (ref 4.20–5.82)
RDW: 18 % — ABNORMAL HIGH (ref 11.0–14.6)
WBC: 4.4 10*3/uL (ref 4.0–10.3)
lymph#: 0.8 10*3/uL — ABNORMAL LOW (ref 0.9–3.3)

## 2013-11-22 MED ORDER — SODIUM CHLORIDE 0.9 % IJ SOLN
10.0000 mL | INTRAMUSCULAR | Status: DC | PRN
  Administered 2013-11-22: 10 mL via INTRAVENOUS
  Filled 2013-11-22: qty 10

## 2013-11-22 MED ORDER — HEPARIN SOD (PORK) LOCK FLUSH 100 UNIT/ML IV SOLN
500.0000 [IU] | Freq: Once | INTRAVENOUS | Status: AC
  Administered 2013-11-22: 500 [IU] via INTRAVENOUS
  Filled 2013-11-22: qty 5

## 2013-11-22 NOTE — Patient Instructions (Signed)

## 2013-11-22 NOTE — Progress Notes (Signed)
Per Dr. Benay Spice; hold treatment today d/t labs and re-schedule appt for next week.  Pt and wife informed of information and new copy of schedule given to pt with labs results.  Pt verbalized understanding of information.

## 2013-11-22 NOTE — Telephone Encounter (Signed)
Per chemo charge RN I have delayed chemo by one week.

## 2013-11-23 ENCOUNTER — Telehealth: Payer: Self-pay | Admitting: Oncology

## 2013-11-23 NOTE — Telephone Encounter (Signed)
s.w. pt and advised on 6.17 appt....pt ok and aware

## 2013-11-28 ENCOUNTER — Other Ambulatory Visit: Payer: Self-pay | Admitting: *Deleted

## 2013-11-28 DIAGNOSIS — C259 Malignant neoplasm of pancreas, unspecified: Secondary | ICD-10-CM

## 2013-11-29 ENCOUNTER — Ambulatory Visit (HOSPITAL_BASED_OUTPATIENT_CLINIC_OR_DEPARTMENT_OTHER): Payer: Medicare Other

## 2013-11-29 ENCOUNTER — Other Ambulatory Visit (HOSPITAL_BASED_OUTPATIENT_CLINIC_OR_DEPARTMENT_OTHER): Payer: Medicare Other

## 2013-11-29 VITALS — BP 132/52 | HR 65 | Temp 97.8°F | Resp 18

## 2013-11-29 DIAGNOSIS — C252 Malignant neoplasm of tail of pancreas: Secondary | ICD-10-CM

## 2013-11-29 DIAGNOSIS — C259 Malignant neoplasm of pancreas, unspecified: Secondary | ICD-10-CM

## 2013-11-29 DIAGNOSIS — C787 Secondary malignant neoplasm of liver and intrahepatic bile duct: Secondary | ICD-10-CM

## 2013-11-29 DIAGNOSIS — Z95828 Presence of other vascular implants and grafts: Secondary | ICD-10-CM

## 2013-11-29 DIAGNOSIS — Z5111 Encounter for antineoplastic chemotherapy: Secondary | ICD-10-CM

## 2013-11-29 LAB — CBC WITH DIFFERENTIAL/PLATELET
BASO%: 1.1 % (ref 0.0–2.0)
Basophils Absolute: 0 10*3/uL (ref 0.0–0.1)
EOS ABS: 0.1 10*3/uL (ref 0.0–0.5)
EOS%: 2.7 % (ref 0.0–7.0)
HCT: 33.1 % — ABNORMAL LOW (ref 38.4–49.9)
HGB: 10.7 g/dL — ABNORMAL LOW (ref 13.0–17.1)
LYMPH#: 1 10*3/uL (ref 0.9–3.3)
LYMPH%: 23.2 % (ref 14.0–49.0)
MCH: 29.4 pg (ref 27.2–33.4)
MCHC: 32.3 g/dL (ref 32.0–36.0)
MCV: 90.8 fL (ref 79.3–98.0)
MONO#: 0.5 10*3/uL (ref 0.1–0.9)
MONO%: 11.2 % (ref 0.0–14.0)
NEUT#: 2.6 10*3/uL (ref 1.5–6.5)
NEUT%: 61.8 % (ref 39.0–75.0)
Platelets: 139 10*3/uL — ABNORMAL LOW (ref 140–400)
RBC: 3.64 10*6/uL — ABNORMAL LOW (ref 4.20–5.82)
RDW: 17.9 % — ABNORMAL HIGH (ref 11.0–14.6)
WBC: 4.2 10*3/uL (ref 4.0–10.3)

## 2013-11-29 LAB — COMPREHENSIVE METABOLIC PANEL (CC13)
ALT: 11 U/L (ref 0–55)
ANION GAP: 10 meq/L (ref 3–11)
AST: 14 U/L (ref 5–34)
Albumin: 3.7 g/dL (ref 3.5–5.0)
Alkaline Phosphatase: 43 U/L (ref 40–150)
BUN: 20.6 mg/dL (ref 7.0–26.0)
CHLORIDE: 104 meq/L (ref 98–109)
CO2: 24 meq/L (ref 22–29)
Calcium: 9.6 mg/dL (ref 8.4–10.4)
Creatinine: 1.6 mg/dL — ABNORMAL HIGH (ref 0.7–1.3)
GLUCOSE: 298 mg/dL — AB (ref 70–140)
Potassium: 4.9 mEq/L (ref 3.5–5.1)
SODIUM: 138 meq/L (ref 136–145)
TOTAL PROTEIN: 6.6 g/dL (ref 6.4–8.3)
Total Bilirubin: 0.64 mg/dL (ref 0.20–1.20)

## 2013-11-29 MED ORDER — HEPARIN SOD (PORK) LOCK FLUSH 100 UNIT/ML IV SOLN
500.0000 [IU] | Freq: Once | INTRAVENOUS | Status: AC | PRN
  Administered 2013-11-29: 500 [IU]
  Filled 2013-11-29: qty 5

## 2013-11-29 MED ORDER — SODIUM CHLORIDE 0.9 % IJ SOLN
10.0000 mL | INTRAMUSCULAR | Status: DC | PRN
  Administered 2013-11-29: 10 mL via INTRAVENOUS
  Filled 2013-11-29: qty 10

## 2013-11-29 MED ORDER — SODIUM CHLORIDE 0.9 % IV SOLN
560.0000 mg/m2 | Freq: Once | INTRAVENOUS | Status: AC
  Administered 2013-11-29: 1254 mg via INTRAVENOUS
  Filled 2013-11-29: qty 32.98

## 2013-11-29 MED ORDER — PACLITAXEL PROTEIN-BOUND CHEMO INJECTION 100 MG
80.0000 mg/m2 | Freq: Once | INTRAVENOUS | Status: AC
  Administered 2013-11-29: 175 mg via INTRAVENOUS
  Filled 2013-11-29: qty 35

## 2013-11-29 MED ORDER — SODIUM CHLORIDE 0.9 % IJ SOLN
10.0000 mL | INTRAMUSCULAR | Status: DC | PRN
  Administered 2013-11-29: 10 mL
  Filled 2013-11-29: qty 10

## 2013-11-29 MED ORDER — DEXAMETHASONE SODIUM PHOSPHATE 10 MG/ML IJ SOLN
INTRAMUSCULAR | Status: AC
  Filled 2013-11-29: qty 1

## 2013-11-29 MED ORDER — SODIUM CHLORIDE 0.9 % IV SOLN
Freq: Once | INTRAVENOUS | Status: AC
  Administered 2013-11-29: 12:00:00 via INTRAVENOUS

## 2013-11-29 MED ORDER — DEXAMETHASONE SODIUM PHOSPHATE 10 MG/ML IJ SOLN
5.0000 mg | Freq: Once | INTRAMUSCULAR | Status: AC
  Administered 2013-11-29: 5 mg via INTRAVENOUS

## 2013-11-29 MED ORDER — ONDANSETRON 8 MG/NS 50 ML IVPB
INTRAVENOUS | Status: AC
  Filled 2013-11-29: qty 8

## 2013-11-29 MED ORDER — ONDANSETRON 8 MG/50ML IVPB (CHCC)
8.0000 mg | Freq: Once | INTRAVENOUS | Status: AC
  Administered 2013-11-29: 8 mg via INTRAVENOUS

## 2013-11-29 NOTE — Patient Instructions (Signed)
[facility] Discharge Instructions for Patients Receiving Chemotherapy  Today you received the following chemotherapy agents Gemcitabine and Abraxane.   To help prevent nausea and vomiting after your treatment, we encourage you to take your nausea medication as directed.    If you develop nausea and vomiting that is not controlled by your nausea medication, call the clinic.   BELOW ARE SYMPTOMS THAT SHOULD BE REPORTED IMMEDIATELY:  *FEVER GREATER THAN 100.5 F  *CHILLS WITH OR WITHOUT FEVER  NAUSEA AND VOMITING THAT IS NOT CONTROLLED WITH YOUR NAUSEA MEDICATION  *UNUSUAL SHORTNESS OF BREATH  *UNUSUAL BRUISING OR BLEEDING  TENDERNESS IN MOUTH AND THROAT WITH OR WITHOUT PRESENCE OF ULCERS  *URINARY PROBLEMS  *BOWEL PROBLEMS  UNUSUAL RASH Items with * indicate a potential emergency and should be followed up as soon as possible.  Feel free to call the clinic you have any questions or concerns. The clinic phone number is [phone].

## 2013-11-29 NOTE — Patient Instructions (Signed)

## 2013-11-29 NOTE — Progress Notes (Signed)
Creatinine 1.6. Dr. Benay Spice notified. Ok to treat per MD.

## 2013-12-06 ENCOUNTER — Telehealth: Payer: Self-pay | Admitting: Oncology

## 2013-12-06 ENCOUNTER — Ambulatory Visit (HOSPITAL_BASED_OUTPATIENT_CLINIC_OR_DEPARTMENT_OTHER): Payer: Medicare Other | Admitting: Oncology

## 2013-12-06 ENCOUNTER — Ambulatory Visit: Payer: Medicare Other

## 2013-12-06 ENCOUNTER — Other Ambulatory Visit (HOSPITAL_BASED_OUTPATIENT_CLINIC_OR_DEPARTMENT_OTHER): Payer: Medicare Other

## 2013-12-06 VITALS — BP 125/72 | HR 71 | Temp 97.7°F | Resp 18 | Ht 73.0 in | Wt 214.5 lb

## 2013-12-06 DIAGNOSIS — C252 Malignant neoplasm of tail of pancreas: Secondary | ICD-10-CM

## 2013-12-06 DIAGNOSIS — E119 Type 2 diabetes mellitus without complications: Secondary | ICD-10-CM

## 2013-12-06 DIAGNOSIS — R109 Unspecified abdominal pain: Secondary | ICD-10-CM

## 2013-12-06 DIAGNOSIS — R51 Headache: Secondary | ICD-10-CM

## 2013-12-06 DIAGNOSIS — C259 Malignant neoplasm of pancreas, unspecified: Secondary | ICD-10-CM

## 2013-12-06 DIAGNOSIS — F3289 Other specified depressive episodes: Secondary | ICD-10-CM

## 2013-12-06 DIAGNOSIS — R634 Abnormal weight loss: Secondary | ICD-10-CM

## 2013-12-06 DIAGNOSIS — C787 Secondary malignant neoplasm of liver and intrahepatic bile duct: Secondary | ICD-10-CM

## 2013-12-06 DIAGNOSIS — F329 Major depressive disorder, single episode, unspecified: Secondary | ICD-10-CM

## 2013-12-06 DIAGNOSIS — M25519 Pain in unspecified shoulder: Secondary | ICD-10-CM

## 2013-12-06 DIAGNOSIS — R630 Anorexia: Secondary | ICD-10-CM

## 2013-12-06 LAB — COMPREHENSIVE METABOLIC PANEL (CC13)
ALT: 15 U/L (ref 0–55)
AST: 14 U/L (ref 5–34)
Albumin: 3.6 g/dL (ref 3.5–5.0)
Alkaline Phosphatase: 45 U/L (ref 40–150)
Anion Gap: 7 mEq/L (ref 3–11)
BUN: 17.6 mg/dL (ref 7.0–26.0)
CALCIUM: 9.1 mg/dL (ref 8.4–10.4)
CHLORIDE: 107 meq/L (ref 98–109)
CO2: 24 mEq/L (ref 22–29)
Creatinine: 1.4 mg/dL — ABNORMAL HIGH (ref 0.7–1.3)
Glucose: 239 mg/dl — ABNORMAL HIGH (ref 70–140)
POTASSIUM: 4.4 meq/L (ref 3.5–5.1)
SODIUM: 137 meq/L (ref 136–145)
Total Bilirubin: 0.44 mg/dL (ref 0.20–1.20)
Total Protein: 6.6 g/dL (ref 6.4–8.3)

## 2013-12-06 LAB — CBC WITH DIFFERENTIAL/PLATELET
BASO%: 1.4 % (ref 0.0–2.0)
Basophils Absolute: 0 10*3/uL (ref 0.0–0.1)
EOS%: 1.3 % (ref 0.0–7.0)
Eosinophils Absolute: 0 10*3/uL (ref 0.0–0.5)
HCT: 31 % — ABNORMAL LOW (ref 38.4–49.9)
HGB: 10.2 g/dL — ABNORMAL LOW (ref 13.0–17.1)
LYMPH#: 0.7 10*3/uL — AB (ref 0.9–3.3)
LYMPH%: 37.8 % (ref 14.0–49.0)
MCH: 29.2 pg (ref 27.2–33.4)
MCHC: 32.8 g/dL (ref 32.0–36.0)
MCV: 89.2 fL (ref 79.3–98.0)
MONO#: 0.1 10*3/uL (ref 0.1–0.9)
MONO%: 7.5 % (ref 0.0–14.0)
NEUT#: 1 10*3/uL — ABNORMAL LOW (ref 1.5–6.5)
NEUT%: 52 % (ref 39.0–75.0)
Platelets: 82 10*3/uL — ABNORMAL LOW (ref 140–400)
RBC: 3.47 10*6/uL — AB (ref 4.20–5.82)
RDW: 16.7 % — AB (ref 11.0–14.6)
WBC: 2 10*3/uL — ABNORMAL LOW (ref 4.0–10.3)

## 2013-12-06 NOTE — Progress Notes (Signed)
  [facility] OFFICE PROGRESS NOTE   Diagnosis: Pancreas cancer  INTERVAL HISTORY:   He returns as scheduled. He was last treated with gemcitabine/Abraxane 11/29/2013. He tolerated chemotherapy well. He reports intermittent tingling and numbness at the right second and third fingertip. No weakness. He complains of pain in the right shoulder for the past few weeks. He had left shoulder surgery in the past. No neck pain. Mr. Noffke is being evaluated by Dr. Durward Fortes.  Objective:  Vital signs in last 24 hours:  Blood pressure 125/72, pulse 71, temperature 97.7 F (36.5 C), temperature source Oral, resp. rate 18, height 6\' 1"  (1.854 m), weight 214 lb 8 oz (97.297 kg), SpO2 100.00%.    HEENT: No thrush or ulcers Resp: Lungs clear bilaterally Cardio: Regular rate and rhythm GI: No hepatomegaly, nontender, no mass Vascular: No leg edema Neuro: Mild decrease in vibratory sense at the fingertips bilaterally. The motor exam appears intact in the arms and hands bilaterally  Musculoskeletal: Full range of motion at the right shoulder without pain. No neck tenderness. Mild tenderness over the right shoulder joint   Portacath/PICC-without erythema  Lab Results:  Lab Results  Component Value Date   WBC 2.0* 12/06/2013   HGB 10.2* 12/06/2013   HCT 31.0* 12/06/2013   MCV 89.2 12/06/2013   PLT 82* 12/06/2013   NEUTROABS 1.0* 12/06/2013     Medications: I have reviewed the patient's current medications.  Assessment/Plan: 1. Adenocarcinoma of the pancreas status post ultrasound-guided biopsy of a liver lesion 10/18/2012 consistent with metastatic pancreas cancer. CT 10/11/2012 revealed a pancreas tail mass and liver metastases. CA 19-9 in normal range on 10/24/2012. Initiation of gemcitabine/Abraxane on a day 1, day 8 day 15 schedule on 10/28/2012. Current schedule is day one/day 8.  Restaging CT 01/26/2013 revealed a decrease in the size of liver metastases and no new lesions    Gemcitabine/Abraxane continued on a day 1, day 8 schedule.  Restaging CT 04/25/2013 with a decrease in the size of the pancreas mass and liver lesions.  Gemcitabine/Abraxane continued on a day 1, day 8 schedule.  Restaging CT 08/08/2013-further regression of the pancreas mass, stable liver lesions.  Gemcitabine/Abraxane continued on a day 1, day 8 schedule.  Gemcitabine/Abraxane Changed to an every 2 week schedule beginning 10/11/2013.  2. Abdominal pain secondary to the pancreas mass. Improved. 3. Anorexia/weight loss. Improved. 4. Diabetes. Exacerbated by steroid premedication. Decadron was reduced to 5 mg after cycle 1 day 1. 5. History of coronary artery disease. 6. Headache following cycle 1 day 1 gemcitabine/Abraxane. Question related to Zofran.  7. History of thrombocytopenia secondary to chemotherapy. 8. Mild to moderate loss of vibratory sense in the fingertips. Question secondary to Abraxane versus diabetes. Not interfering with activity at present. 9. Depression-he declined an antidepressant. Counseling services were offered as well.   Disposition:  His overall status appears unchanged. The plan is to continue gemcitabine/Abraxane every 2 weeks beginning with the next treatment on 12/13/2013. He will be scheduled for a restaging CT evaluation 12/26/2013 and an office visit 12/27/2013.  I doubt the right shoulder discomfort is related to pancreas cancer, but this is possible. He plans to schedule an appointment with Dr. Durward Fortes to consider a steroid injection.  [name], MD  12/06/2013  9:45 AM

## 2013-12-06 NOTE — Telephone Encounter (Signed)
per [facility] scheduling 7/14 lab time changed to 12:30pm to coord w/ct. lmonvm informing pt and pt to get new schedule 7/1.

## 2013-12-06 NOTE — Telephone Encounter (Signed)
gv pt appt schedule for july. [facility] will call re ct appt - pt aware.

## 2013-12-10 ENCOUNTER — Other Ambulatory Visit: Payer: Self-pay | Admitting: Oncology

## 2013-12-13 ENCOUNTER — Ambulatory Visit: Payer: Medicare Other

## 2013-12-13 ENCOUNTER — Other Ambulatory Visit (HOSPITAL_BASED_OUTPATIENT_CLINIC_OR_DEPARTMENT_OTHER): Payer: Medicare Other

## 2013-12-13 ENCOUNTER — Encounter: Payer: Self-pay | Admitting: *Deleted

## 2013-12-13 ENCOUNTER — Other Ambulatory Visit: Payer: Self-pay | Admitting: Oncology

## 2013-12-13 ENCOUNTER — Telehealth: Payer: Self-pay | Admitting: *Deleted

## 2013-12-13 ENCOUNTER — Other Ambulatory Visit: Payer: Self-pay | Admitting: Emergency Medicine

## 2013-12-13 DIAGNOSIS — C259 Malignant neoplasm of pancreas, unspecified: Secondary | ICD-10-CM

## 2013-12-13 LAB — CBC WITH DIFFERENTIAL/PLATELET
BASO%: 0.5 % (ref 0.0–2.0)
BASOS ABS: 0 10*3/uL (ref 0.0–0.1)
EOS%: 2.4 % (ref 0.0–7.0)
Eosinophils Absolute: 0.1 10*3/uL (ref 0.0–0.5)
HCT: 31.8 % — ABNORMAL LOW (ref 38.4–49.9)
HEMOGLOBIN: 10.5 g/dL — AB (ref 13.0–17.1)
LYMPH%: 21.7 % (ref 14.0–49.0)
MCH: 29.2 pg (ref 27.2–33.4)
MCHC: 33 g/dL (ref 32.0–36.0)
MCV: 88.3 fL (ref 79.3–98.0)
MONO#: 0.4 10*3/uL (ref 0.1–0.9)
MONO%: 8.8 % (ref 0.0–14.0)
NEUT#: 2.8 10*3/uL (ref 1.5–6.5)
NEUT%: 66.6 % (ref 39.0–75.0)
Platelets: 79 10*3/uL — ABNORMAL LOW (ref 140–400)
RBC: 3.6 10*6/uL — ABNORMAL LOW (ref 4.20–5.82)
RDW: 15.7 % — ABNORMAL HIGH (ref 11.0–14.6)
WBC: 4.2 10*3/uL (ref 4.0–10.3)
lymph#: 0.9 10*3/uL (ref 0.9–3.3)

## 2013-12-13 NOTE — Telephone Encounter (Signed)
Per  Staff message and POF scheduled appts. Pt called

## 2013-12-20 ENCOUNTER — Ambulatory Visit (HOSPITAL_BASED_OUTPATIENT_CLINIC_OR_DEPARTMENT_OTHER): Payer: Medicare Other

## 2013-12-20 ENCOUNTER — Other Ambulatory Visit (HOSPITAL_BASED_OUTPATIENT_CLINIC_OR_DEPARTMENT_OTHER): Payer: Medicare Other

## 2013-12-20 VITALS — BP 130/69 | HR 61 | Temp 98.2°F | Resp 18

## 2013-12-20 DIAGNOSIS — Z5111 Encounter for antineoplastic chemotherapy: Secondary | ICD-10-CM

## 2013-12-20 DIAGNOSIS — C252 Malignant neoplasm of tail of pancreas: Secondary | ICD-10-CM

## 2013-12-20 DIAGNOSIS — C787 Secondary malignant neoplasm of liver and intrahepatic bile duct: Secondary | ICD-10-CM

## 2013-12-20 DIAGNOSIS — C259 Malignant neoplasm of pancreas, unspecified: Secondary | ICD-10-CM

## 2013-12-20 LAB — CBC WITH DIFFERENTIAL/PLATELET
BASO%: 0.7 % (ref 0.0–2.0)
Basophils Absolute: 0 10*3/uL (ref 0.0–0.1)
EOS%: 2.6 % (ref 0.0–7.0)
Eosinophils Absolute: 0.1 10*3/uL (ref 0.0–0.5)
HCT: 32.4 % — ABNORMAL LOW (ref 38.4–49.9)
HGB: 10.6 g/dL — ABNORMAL LOW (ref 13.0–17.1)
LYMPH%: 21.8 % (ref 14.0–49.0)
MCH: 29.4 pg (ref 27.2–33.4)
MCHC: 32.6 g/dL (ref 32.0–36.0)
MCV: 90.1 fL (ref 79.3–98.0)
MONO#: 0.4 10*3/uL (ref 0.1–0.9)
MONO%: 10.2 % (ref 0.0–14.0)
NEUT#: 2.5 10*3/uL (ref 1.5–6.5)
NEUT%: 64.7 % (ref 39.0–75.0)
Platelets: 138 10*3/uL — ABNORMAL LOW (ref 140–400)
RBC: 3.6 10*6/uL — ABNORMAL LOW (ref 4.20–5.82)
RDW: 16.9 % — ABNORMAL HIGH (ref 11.0–14.6)
WBC: 3.9 10*3/uL — ABNORMAL LOW (ref 4.0–10.3)
lymph#: 0.9 10*3/uL (ref 0.9–3.3)

## 2013-12-20 LAB — COMPREHENSIVE METABOLIC PANEL (CC13)
ALBUMIN: 3.6 g/dL (ref 3.5–5.0)
ALK PHOS: 44 U/L (ref 40–150)
ALT: 17 U/L (ref 0–55)
AST: 16 U/L (ref 5–34)
Anion Gap: 7 mEq/L (ref 3–11)
BUN: 18.7 mg/dL (ref 7.0–26.0)
CO2: 23 mEq/L (ref 22–29)
Calcium: 9.1 mg/dL (ref 8.4–10.4)
Chloride: 108 mEq/L (ref 98–109)
Creatinine: 1.5 mg/dL — ABNORMAL HIGH (ref 0.7–1.3)
Glucose: 363 mg/dl — ABNORMAL HIGH (ref 70–140)
POTASSIUM: 4.6 meq/L (ref 3.5–5.1)
SODIUM: 137 meq/L (ref 136–145)
TOTAL PROTEIN: 6.4 g/dL (ref 6.4–8.3)
Total Bilirubin: 0.46 mg/dL (ref 0.20–1.20)

## 2013-12-20 MED ORDER — SODIUM CHLORIDE 0.9 % IJ SOLN
10.0000 mL | INTRAMUSCULAR | Status: DC | PRN
  Administered 2013-12-20: 10 mL
  Filled 2013-12-20: qty 10

## 2013-12-20 MED ORDER — SODIUM CHLORIDE 0.9 % IV SOLN
Freq: Once | INTRAVENOUS | Status: AC
  Administered 2013-12-20: 14:00:00 via INTRAVENOUS

## 2013-12-20 MED ORDER — PACLITAXEL PROTEIN-BOUND CHEMO INJECTION 100 MG
80.0000 mg/m2 | Freq: Once | INTRAVENOUS | Status: AC
  Administered 2013-12-20: 175 mg via INTRAVENOUS
  Filled 2013-12-20: qty 35

## 2013-12-20 MED ORDER — ONDANSETRON 8 MG/NS 50 ML IVPB
INTRAVENOUS | Status: AC
  Filled 2013-12-20: qty 8

## 2013-12-20 MED ORDER — DEXAMETHASONE SODIUM PHOSPHATE 10 MG/ML IJ SOLN
5.0000 mg | Freq: Once | INTRAMUSCULAR | Status: AC
  Administered 2013-12-20: 5 mg via INTRAVENOUS

## 2013-12-20 MED ORDER — SODIUM CHLORIDE 0.9 % IV SOLN
560.0000 mg/m2 | Freq: Once | INTRAVENOUS | Status: AC
  Administered 2013-12-20: 1254 mg via INTRAVENOUS
  Filled 2013-12-20: qty 32.98

## 2013-12-20 MED ORDER — HEPARIN SOD (PORK) LOCK FLUSH 100 UNIT/ML IV SOLN
500.0000 [IU] | Freq: Once | INTRAVENOUS | Status: AC | PRN
  Administered 2013-12-20: 500 [IU]
  Filled 2013-12-20: qty 5

## 2013-12-20 MED ORDER — DEXAMETHASONE SODIUM PHOSPHATE 10 MG/ML IJ SOLN
INTRAMUSCULAR | Status: AC
  Filled 2013-12-20: qty 1

## 2013-12-20 MED ORDER — ONDANSETRON 8 MG/50ML IVPB (CHCC)
8.0000 mg | Freq: Once | INTRAVENOUS | Status: AC
  Administered 2013-12-20: 8 mg via INTRAVENOUS

## 2013-12-20 NOTE — Patient Instructions (Addendum)
[facility] Discharge Instructions for Patients Receiving Chemotherapy  Today you received the following chemotherapy agents Abraxane and Gemcitabine.   To help prevent nausea and vomiting after your treatment, we encourage you to take your nausea medication as directed. d   If you develop nausea and vomiting that is not controlled by your nausea medication, call the clinic.   BELOW ARE SYMPTOMS THAT SHOULD BE REPORTED IMMEDIATELY:  *FEVER GREATER THAN 100.5 F  *CHILLS WITH OR WITHOUT FEVER  NAUSEA AND VOMITING THAT IS NOT CONTROLLED WITH YOUR NAUSEA MEDICATION  *UNUSUAL SHORTNESS OF BREATH  *UNUSUAL BRUISING OR BLEEDING  TENDERNESS IN MOUTH AND THROAT WITH OR WITHOUT PRESENCE OF ULCERS  *URINARY PROBLEMS  *BOWEL PROBLEMS  UNUSUAL RASH Items with * indicate a potential emergency and should be followed up as soon as possible.  Feel free to call the clinic you have any questions or concerns. The clinic phone number is [phone].

## 2013-12-26 ENCOUNTER — Other Ambulatory Visit (HOSPITAL_BASED_OUTPATIENT_CLINIC_OR_DEPARTMENT_OTHER): Payer: Medicare Other

## 2013-12-26 ENCOUNTER — Encounter (HOSPITAL_COMMUNITY): Payer: Self-pay

## 2013-12-26 ENCOUNTER — Ambulatory Visit (HOSPITAL_COMMUNITY)
Admission: RE | Admit: 2013-12-26 | Discharge: 2013-12-26 | Disposition: A | Discharge status: Home / Self Care | Payer: Medicare Other | Source: Ambulatory Visit | Attending: Oncology | Admitting: Oncology

## 2013-12-26 DIAGNOSIS — I77811 Abdominal aortic ectasia: Secondary | ICD-10-CM | POA: Insufficient documentation

## 2013-12-26 DIAGNOSIS — R109 Unspecified abdominal pain: Secondary | ICD-10-CM | POA: Insufficient documentation

## 2013-12-26 DIAGNOSIS — K7689 Other specified diseases of liver: Secondary | ICD-10-CM | POA: Insufficient documentation

## 2013-12-26 DIAGNOSIS — N281 Cyst of kidney, acquired: Secondary | ICD-10-CM | POA: Insufficient documentation

## 2013-12-26 DIAGNOSIS — I998 Other disorder of circulatory system: Secondary | ICD-10-CM | POA: Insufficient documentation

## 2013-12-26 DIAGNOSIS — I700 Atherosclerosis of aorta: Secondary | ICD-10-CM | POA: Insufficient documentation

## 2013-12-26 DIAGNOSIS — C252 Malignant neoplasm of tail of pancreas: Secondary | ICD-10-CM

## 2013-12-26 DIAGNOSIS — Z9221 Personal history of antineoplastic chemotherapy: Secondary | ICD-10-CM | POA: Insufficient documentation

## 2013-12-26 DIAGNOSIS — C259 Malignant neoplasm of pancreas, unspecified: Secondary | ICD-10-CM

## 2013-12-26 DIAGNOSIS — D7389 Other diseases of spleen: Secondary | ICD-10-CM | POA: Insufficient documentation

## 2013-12-26 LAB — BUN AND CREATININE (CC13)
BUN: 15.1 mg/dL (ref 7.0–26.0)
Creatinine: 1.4 mg/dL — ABNORMAL HIGH (ref 0.7–1.3)

## 2013-12-26 MED ORDER — IOHEXOL 300 MG/ML  SOLN
100.0000 mL | Freq: Once | INTRAMUSCULAR | Status: AC | PRN
  Administered 2013-12-26: 100 mL via INTRAVENOUS

## 2013-12-27 ENCOUNTER — Ambulatory Visit (HOSPITAL_BASED_OUTPATIENT_CLINIC_OR_DEPARTMENT_OTHER): Payer: Medicare Other | Admitting: Nurse Practitioner

## 2013-12-27 ENCOUNTER — Ambulatory Visit: Payer: Medicare Other

## 2013-12-27 ENCOUNTER — Telehealth: Payer: Self-pay | Admitting: Oncology

## 2013-12-27 VITALS — BP 138/80 | HR 66 | Temp 98.2°F | Resp 18 | Ht 73.0 in | Wt 212.5 lb

## 2013-12-27 DIAGNOSIS — R109 Unspecified abdominal pain: Secondary | ICD-10-CM

## 2013-12-27 DIAGNOSIS — F3289 Other specified depressive episodes: Secondary | ICD-10-CM

## 2013-12-27 DIAGNOSIS — C259 Malignant neoplasm of pancreas, unspecified: Secondary | ICD-10-CM

## 2013-12-27 DIAGNOSIS — C252 Malignant neoplasm of tail of pancreas: Secondary | ICD-10-CM

## 2013-12-27 DIAGNOSIS — R634 Abnormal weight loss: Secondary | ICD-10-CM

## 2013-12-27 DIAGNOSIS — E119 Type 2 diabetes mellitus without complications: Secondary | ICD-10-CM

## 2013-12-27 DIAGNOSIS — R630 Anorexia: Secondary | ICD-10-CM

## 2013-12-27 DIAGNOSIS — F329 Major depressive disorder, single episode, unspecified: Secondary | ICD-10-CM

## 2013-12-27 DIAGNOSIS — C787 Secondary malignant neoplasm of liver and intrahepatic bile duct: Secondary | ICD-10-CM

## 2013-12-27 NOTE — Telephone Encounter (Signed)
gv pt appt schedule for aug °

## 2013-12-27 NOTE — Progress Notes (Addendum)
[facility] OFFICE PROGRESS NOTE   Diagnosis:  Pancreas cancer.  INTERVAL HISTORY:   Christian Kaiser returns as scheduled. He was last treated with gemcitabine/Abraxane 12/20/2013. He denies nausea/vomiting. No mouth sores. No diarrhea or constipation. Fingertips feel "slick". Last week he had intermittent left-sided abdominal pain for 2 days. He took his pain medication as needed. The pain has resolved and has not recurred. He continues to have a good appetite. The right shoulder pain is better.  Objective:  Vital signs in last 24 hours:  Blood pressure 138/80, pulse 66, temperature 98.2 F (36.8 C), temperature source Oral, resp. rate 18, height 6\' 1"  (1.854 m), weight 212 lb 8 oz (96.389 kg), SpO2 99.00%.    HEENT: No thrush or ulcerations. Lymphatics: No palpable cervical, supraclavicular or axillary lymph nodes. Resp: Lungs clear. Cardio: Regular cardiac rhythm. GI: Abdomen soft with mild tenderness at the left upper quadrant. No hepatomegaly. No mass. Vascular: No leg edema. Calves soft and nontender.  Port-A-Cath site is without erythema.  Lab Results:  Lab Results  Component Value Date   WBC 3.9* 12/20/2013   HGB 10.6* 12/20/2013   HCT 32.4* 12/20/2013   MCV 90.1 12/20/2013   PLT 138* 12/20/2013   NEUTROABS 2.5 12/20/2013    Imaging:  Ct Abdomen Pelvis W Contrast  12/26/2013   CLINICAL DATA:  Pancreatic cancer diagnosed 14 months ago. Chemotherapy ongoing. Low abdominal pain.  EXAM: CT ABDOMEN AND PELVIS WITH CONTRAST  TECHNIQUE: Multidetector CT imaging of the abdomen and pelvis was performed using the standard protocol following bolus administration of intravenous contrast.  CONTRAST:  139mL OMNIPAQUE IOHEXOL 300 MG/ML  SOLN  COMPARISON:  Prior CTs 08/02/2013 and 04/25/2013.  FINDINGS: There is stable chronic findings at the left lung base with subpleural scarring in the lower lobe. There is no significant pleural or pericardial effusion.  The partially calcified  mass involving the pancreatic tail is ill-defined and somewhat difficult to accurately measure. However, it does appear slightly larger, measuring 4.4 x 2.3 cm on image 33 of series 6. 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  Peripherally enhancing lesion in the right hepatic lobe adjacent to the right portal vein has enlarged, measuring 2.3 cm on image 28 of series 6. This previously measured 10 mm and is consistent with a metastasis. The subcapsular lesion involving the lateral segment of the left hepatic lobe is unchanged, measuring 12 mm on image 41. No other liver lesions are seen. The gallbladder, biliary system and pancreatic head appear normal.  The spleen and adrenal glands appear normal. There is a stable partially calcified cystic lesion in the upper pole of the right kidney. The left kidney appears normal.  There is stable mild dilatation of the abdominal aorta and stable atherosclerosis of its branches and the iliac arteries. There are no enlarged abdominal pelvic lymph nodes. There is no ascites or peritoneal nodularity.  Gastric wall thickening appears stable. The small bowel, appendix and colon appear stable. There are diverticular changes of the sigmoid colon. There is stable mild heterogeneous enlargement of the prostate gland. The bladder appears normal.  There are no worrisome osseous findings.  IMPRESSION: 1. Interval enlargement of peripherally enhancing lesion centrally in the right hepatic lobe consistent with progressive metastatic disease. The residual lesion in the left hepatic lobe is stable. 2. Interval mild progression in partially calcified mass involving the pancreatic tail. 3. No other evidence of metastatic disease. 4. Stable occlusion of the splenic vein.  Electronically Signed   By: Camie Patience M.D.   On: 12/26/2013 14:57    Medications: I have reviewed the patient's current medications.  Assessment/Plan: 1. Adenocarcinoma of the  pancreas status post ultrasound-guided biopsy of a liver lesion 10/18/2012 consistent with metastatic pancreas cancer. CT 10/11/2012 revealed a pancreas tail mass and liver metastases. CA 19-9 in normal range on 10/24/2012. Initiation of gemcitabine/Abraxane on a day 1, day 8 day 15 schedule on 10/28/2012. Current schedule is day one/day 8.  Restaging CT 01/26/2013 revealed a decrease in the size of liver metastases and no new lesions  Gemcitabine/Abraxane continued on a day 1, day 8 schedule.  Restaging CT 04/25/2013 with a decrease in the size of the pancreas mass and liver lesions.  Gemcitabine/Abraxane continued on a day 1, day 8 schedule.  Restaging CT 08/08/2013-further regression of the pancreas mass, stable liver lesions.  Gemcitabine/Abraxane continued on a day 1, day 8 schedule.  Gemcitabine/Abraxane changed to an every 2 week schedule beginning 10/11/2013.  Restaging CT abdomen/pelvis 12/26/2013 with interval enlargement of a peripherally enhancing lesion in the right hepatic lobe; stable lesion left hepatic lobe; interval mild progression of the mass involving the pancreatic tail. 2. Abdominal pain secondary to the pancreas mass. Improved. 3. Anorexia/weight loss. Improved. 4. Diabetes. Exacerbated by steroid premedication. Decadron was reduced to 5 mg after cycle 1 day 1. 5. History of coronary artery disease. 6. Headache following cycle 1 day 1 gemcitabine/Abraxane. Question related to Zofran.  7. History of thrombocytopenia secondary to chemotherapy. 8. Mild to moderate loss of vibratory sense in the fingertips. Question secondary to Abraxane versus diabetes. Not interfering with activity at present. 9. Depression-he declined an antidepressant. Counseling services were offered as well.   Disposition: Christian Kaiser appears unchanged. The recent restaging CT scans showed evidence of mild progression involving a liver lesion and the pancreas mass. Dr. Benay Spice reviewed the CT findings as  well as the images on the computer with Christian Kaiser and his wife.  Dr. Benay Spice recommends a treatment break with a repeat CT scan at an approximate three-month interval. We initiated discussion regarding the FOLFOX regimen. They were provided with written information on 5-FU and oxaliplatin.  Christian Kaiser will return for a followup visit and Port-A-Cath flush on 01/31/2014. He will contact the office in the interim with any problems.  Patient seen with Dr. Benay Spice. 25 minutes were spent face-to-face at today's visit with the majority of that time involving counseling/correlation of care.    Ned Card ANP/GNP-BC   12/27/2013  9:45 AM  This was a shared visit with Ned Card. The restaging CT reveals mild progression of the metastatic pancreas cancer. We discussed the CT findings and reviewed the images with Christian Kaiser and his wife. I recommend a treatment break. We will consider FOLFOX or FOLFIRINOX when there is more significant disease progression.  He will return for an office visit and Port-A-Cath flush in one month.  [name], M.D.

## 2013-12-27 NOTE — Patient Instructions (Signed)
Fluorouracil, 5-FU injection What is this medicine? FLUOROURACIL, 5-FU (flure oh YOOR a sil) is a chemotherapy drug. It slows the growth of cancer cells. This medicine is used to treat many types of cancer like breast cancer, colon or rectal cancer, pancreatic cancer, and stomach cancer. This medicine may be used for other purposes; ask your health care provider or pharmacist if you have questions. COMMON BRAND NAME(S): Adrucil What should I tell my health care provider before I take this medicine? They need to know if you have any of these conditions: -blood disorders -dihydropyrimidine dehydrogenase (DPD) deficiency -infection (especially a virus infection such as chickenpox, cold sores, or herpes) -kidney disease -liver disease -malnourished, poor nutrition -recent or ongoing radiation therapy -an unusual or allergic reaction to fluorouracil, other chemotherapy, other medicines, foods, dyes, or preservatives -pregnant or trying to get pregnant -breast-feeding How should I use this medicine? This drug is given as an infusion or injection into a vein. It is administered in a hospital or clinic by a specially trained health care professional. Talk to your pediatrician regarding the use of this medicine in children. Special care may be needed. Overdosage: If you think you have taken too much of this medicine contact a poison control center or emergency room at once. NOTE: This medicine is only for you. Do not share this medicine with others. What if I miss a dose? It is important not to miss your dose. Call your doctor or health care professional if you are unable to keep an appointment. What may interact with this medicine? -allopurinol -cimetidine -dapsone -digoxin -hydroxyurea -leucovorin -levamisole -medicines for seizures like ethotoin, fosphenytoin, phenytoin -medicines to increase blood counts like filgrastim, pegfilgrastim, sargramostim -medicines that treat or prevent blood  clots like warfarin, enoxaparin, and dalteparin -methotrexate -metronidazole -pyrimethamine -some other chemotherapy drugs like busulfan, cisplatin, estramustine, vinblastine -trimethoprim -trimetrexate -vaccines Talk to your doctor or health care professional before taking any of these medicines: -acetaminophen -aspirin -ibuprofen -ketoprofen -naproxen This list may not describe all possible interactions. Give your health care provider a list of all the medicines, herbs, non-prescription drugs, or dietary supplements you use. Also tell them if you smoke, drink alcohol, or use illegal drugs. Some items may interact with your medicine. What should I watch for while using this medicine? Visit your doctor for checks on your progress. This drug may make you feel generally unwell. This is not uncommon, as chemotherapy can affect healthy cells as well as cancer cells. Report any side effects. Continue your course of treatment even though you feel ill unless your doctor tells you to stop. In some cases, you may be given additional medicines to help with side effects. Follow all directions for their use. Call your doctor or health care professional for advice if you get a fever, chills or sore throat, or other symptoms of a cold or flu. Do not treat yourself. This drug decreases your body's ability to fight infections. Try to avoid being around people who are sick. This medicine may increase your risk to bruise or bleed. Call your doctor or health care professional if you notice any unusual bleeding. Be careful brushing and flossing your teeth or using a toothpick because you may get an infection or bleed more easily. If you have any dental work done, tell your dentist you are receiving this medicine. Avoid taking products that contain aspirin, acetaminophen, ibuprofen, naproxen, or ketoprofen unless instructed by your doctor. These medicines may hide a fever. Do not become pregnant while taking this  medicine. Women should inform their doctor if they wish to become pregnant or think they might be pregnant. There is a potential for serious side effects to an unborn child. Talk to your health care professional or pharmacist for more information. Do not breast-feed an infant while taking this medicine. Men should inform their doctor if they wish to father a child. This medicine may lower sperm counts. Do not treat diarrhea with over the counter products. Contact your doctor if you have diarrhea that lasts more than 2 days or if it is severe and watery. This medicine can make you more sensitive to the sun. Keep out of the sun. If you cannot avoid being in the sun, wear protective clothing and use sunscreen. Do not use sun lamps or tanning beds/booths. What side effects may I notice from receiving this medicine? Side effects that you should report to your doctor or health care professional as soon as possible: -allergic reactions like skin rash, itching or hives, swelling of the face, lips, or tongue -low blood counts - this medicine may decrease the number of white blood cells, red blood cells and platelets. You may be at increased risk for infections and bleeding. -signs of infection - fever or chills, cough, sore throat, pain or difficulty passing urine -signs of decreased platelets or bleeding - bruising, pinpoint red spots on the skin, black, tarry stools, blood in the urine -signs of decreased red blood cells - unusually weak or tired, fainting spells, lightheadedness -breathing problems -changes in vision -chest pain -mouth sores -nausea and vomiting -pain, swelling, redness at site where injected -pain, tingling, numbness in the hands or feet -redness, swelling, or sores on hands or feet -stomach pain -unusual bleeding Side effects that usually do not require medical attention (report to your doctor or health care professional if they continue or are bothersome): -changes in finger or  toe nails -diarrhea -dry or itchy skin -hair loss -headache -loss of appetite -sensitivity of eyes to the light -stomach upset -unusually teary eyes This list may not describe all possible side effects. Call your doctor for medical advice about side effects. You may report side effects to FDA at 1-800-FDA-1088. Where should I keep my medicine? This drug is given in a hospital or clinic and will not be stored at home. NOTE: This sheet is a summary. It may not cover all possible information. If you have questions about this medicine, talk to your doctor, pharmacist, or health care provider.  2015, Elsevier/Gold Standard. (2007-10-05 13:53:16) Oxaliplatin Injection What is this medicine? OXALIPLATIN (ox AL i PLA tin) is a chemotherapy drug. It targets fast dividing cells, like cancer cells, and causes these cells to die. This medicine is used to treat cancers of the colon and rectum, and many other cancers. This medicine may be used for other purposes; ask your health care provider or pharmacist if you have questions. COMMON BRAND NAME(S): Eloxatin What should I tell my health care provider before I take this medicine? They need to know if you have any of these conditions: -kidney disease -an unusual or allergic reaction to oxaliplatin, other chemotherapy, other medicines, foods, dyes, or preservatives -pregnant or trying to get pregnant -breast-feeding How should I use this medicine? This drug is given as an infusion into a vein. It is administered in a hospital or clinic by a specially trained health care professional. Talk to your pediatrician regarding the use of this medicine in children. Special care may be needed. Overdosage: If you think you  have taken too much of this medicine contact a poison control center or emergency room at once. NOTE: This medicine is only for you. Do not share this medicine with others. What if I miss a dose? It is important not to miss a dose. Call your  doctor or health care professional if you are unable to keep an appointment. What may interact with this medicine? -medicines to increase blood counts like filgrastim, pegfilgrastim, sargramostim -probenecid -some antibiotics like amikacin, gentamicin, neomycin, polymyxin B, streptomycin, tobramycin -zalcitabine Talk to your doctor or health care professional before taking any of these medicines: -acetaminophen -aspirin -ibuprofen -ketoprofen -naproxen This list may not describe all possible interactions. Give your health care provider a list of all the medicines, herbs, non-prescription drugs, or dietary supplements you use. Also tell them if you smoke, drink alcohol, or use illegal drugs. Some items may interact with your medicine. What should I watch for while using this medicine? Your condition will be monitored carefully while you are receiving this medicine. You will need important blood work done while you are taking this medicine. This medicine can make you more sensitive to cold. Do not drink cold drinks or use ice. Cover exposed skin before coming in contact with cold temperatures or cold objects. When out in cold weather wear warm clothing and cover your mouth and nose to warm the air that goes into your lungs. Tell your doctor if you get sensitive to the cold. This drug may make you feel generally unwell. This is not uncommon, as chemotherapy can affect healthy cells as well as cancer cells. Report any side effects. Continue your course of treatment even though you feel ill unless your doctor tells you to stop. In some cases, you may be given additional medicines to help with side effects. Follow all directions for their use. Call your doctor or health care professional for advice if you get a fever, chills or sore throat, or other symptoms of a cold or flu. Do not treat yourself. This drug decreases your body's ability to fight infections. Try to avoid being around people who are  sick. This medicine may increase your risk to bruise or bleed. Call your doctor or health care professional if you notice any unusual bleeding. Be careful brushing and flossing your teeth or using a toothpick because you may get an infection or bleed more easily. If you have any dental work done, tell your dentist you are receiving this medicine. Avoid taking products that contain aspirin, acetaminophen, ibuprofen, naproxen, or ketoprofen unless instructed by your doctor. These medicines may hide a fever. Do not become pregnant while taking this medicine. Women should inform their doctor if they wish to become pregnant or think they might be pregnant. There is a potential for serious side effects to an unborn child. Talk to your health care professional or pharmacist for more information. Do not breast-feed an infant while taking this medicine. Call your doctor or health care professional if you get diarrhea. Do not treat yourself. What side effects may I notice from receiving this medicine? Side effects that you should report to your doctor or health care professional as soon as possible: -allergic reactions like skin rash, itching or hives, swelling of the face, lips, or tongue -low blood counts - This drug may decrease the number of white blood cells, red blood cells and platelets. You may be at increased risk for infections and bleeding. -signs of infection - fever or chills, cough, sore throat, pain or difficulty  passing urine -signs of decreased platelets or bleeding - bruising, pinpoint red spots on the skin, black, tarry stools, nosebleeds -signs of decreased red blood cells - unusually weak or tired, fainting spells, lightheadedness -breathing problems -chest pain, pressure -cough -diarrhea -jaw tightness -mouth sores -nausea and vomiting -pain, swelling, redness or irritation at the injection site -pain, tingling, numbness in the hands or feet -problems with balance, talking,  walking -redness, blistering, peeling or loosening of the skin, including inside the mouth -trouble passing urine or change in the amount of urine Side effects that usually do not require medical attention (report to your doctor or health care professional if they continue or are bothersome): -changes in vision -constipation -hair loss -loss of appetite -metallic taste in the mouth or changes in taste -stomach pain This list may not describe all possible side effects. Call your doctor for medical advice about side effects. You may report side effects to FDA at 1-800-FDA-1088. Where should I keep my medicine? This drug is given in a hospital or clinic and will not be stored at home. NOTE: This sheet is a summary. It may not cover all possible information. If you have questions about this medicine, talk to your doctor, pharmacist, or health care provider.  2015, Elsevier/Gold Standard. (2007-12-27 17:22:47)  

## 2014-01-10 ENCOUNTER — Telehealth: Payer: Self-pay | Admitting: *Deleted

## 2014-01-10 ENCOUNTER — Telehealth: Payer: Self-pay | Admitting: Oncology

## 2014-01-10 ENCOUNTER — Other Ambulatory Visit: Payer: Self-pay | Admitting: *Deleted

## 2014-01-10 NOTE — Telephone Encounter (Signed)
added pt appt.Marland KitchenMarland KitchenMarland KitchenMarland Kitchenpt aware of appt and will call if appt is still needed on tomorrow

## 2014-01-10 NOTE — Telephone Encounter (Signed)
VERBAL ORDER AND READ BACK TO LISA THOMAS,NP- PT. TO TAKE TWO [place] 5/325 EVERY SIX HOURS AS NEEDED FOR PAIN. IF THIS DOSE OF MEDICATION DOES NOT RELIEVE PT.'S PAIN OR HE HAS A FEVER, NAUSEA, VOMITING, OR DIARRHEA; PT. SHOULD GO TO THE EMERGENCY ROOM TO BE EVALUATED. NOTIFIED PT.'S WIFE OF THE ABOVE INSTRUCTIONS WITH TEACH BACK METHOD. PT. WILL CALL TO THIS OFFICE TOMORROW WITH A UPDATE.

## 2014-01-10 NOTE — Telephone Encounter (Signed)
   Provider input needed: CONSTANT SHARP PAIN AT A SCALE OF TEN SINCE THIS MORNING.   Reason for call: PT. HAS HAD PAIN ALL WEEK BUT NOT AS SEVERE AS TODAY.  Gastrointestinal: negative for constipation, diarrhea, nausea and vomiting   ALLERGIES:  is allergic to altace.  Patient last received chemotherapy/ treatment on [place] ON 12/20/13  Patient was last seen in the office on 12/27/13      Next appt is 01/31/14  Is patient having fevers greater than 100.5?  no   Is patient having uncontrolled pain, or new pain? yes   Is patient having new back pain that changes with position (worsens or eases when laying down?)  no   Is patient able to eat and drink? yes    Is patient able to pass stool without difficulty?   no     Is patient having uncontrolled nausea?  no    patient calls 01/10/2014 with complaint of  Gastrointestinal: positive for LOWER LEFT SIDED PAIN    Summary Based on the above information advised patient to  AWAIT A RETURN CALL.   Waverly Ferrari M  01/10/2014, 3:54 PM   Background Info  Asaf Elmquist Wolpert   DOB: [birth year]   MR#: [id number]   CSN#   [id number] 01/10/2014

## 2014-01-10 NOTE — Telephone Encounter (Signed)
Call from pt's wife reporting his Hydrocodone expired 10/2013, asks if it is OK for him to take? Suggested she call pharmacy where filled, they will be able to tell her when med is no longer safe to take. One year expiration date is applied to all medications. She voiced understanding. Instructed her to get new Rx when in the office on 7/30.

## 2014-01-11 ENCOUNTER — Telehealth: Payer: Self-pay | Admitting: *Deleted

## 2014-01-11 ENCOUNTER — Other Ambulatory Visit: Payer: Self-pay | Admitting: *Deleted

## 2014-01-11 ENCOUNTER — Ambulatory Visit: Payer: Medicare Other | Admitting: Nurse Practitioner

## 2014-01-11 DIAGNOSIS — C259 Malignant neoplasm of pancreas, unspecified: Secondary | ICD-10-CM

## 2014-01-11 MED ORDER — HYDROCODONE-ACETAMINOPHEN 5-325 MG PO TABS: 1.0000 | ORAL_TABLET | ORAL | Status: DC | PRN

## 2014-01-11 NOTE — Telephone Encounter (Signed)
Pt did not come for appt today as scheduled.  Called pt at home and spoke with pt and wife.  Per pt, he was feeling much better today.  Still has some pain but nothing like it was yesterday.  Pt stated he took expired pain medications with some relief.  Pt did not think he needed to come in for evaluation at this time.   Informed pt that a new prescription for pain meds will be ready for pt/wife to pick up today.  Wife voiced understanding.

## 2014-01-31 ENCOUNTER — Other Ambulatory Visit: Payer: Self-pay | Admitting: *Deleted

## 2014-01-31 ENCOUNTER — Telehealth: Payer: Self-pay | Admitting: Oncology

## 2014-01-31 ENCOUNTER — Ambulatory Visit (HOSPITAL_BASED_OUTPATIENT_CLINIC_OR_DEPARTMENT_OTHER): Payer: Medicare Other | Admitting: Oncology

## 2014-01-31 VITALS — BP 138/87 | HR 71 | Temp 98.1°F | Resp 18 | Ht 73.0 in | Wt 211.5 lb

## 2014-01-31 DIAGNOSIS — C259 Malignant neoplasm of pancreas, unspecified: Secondary | ICD-10-CM

## 2014-01-31 DIAGNOSIS — C787 Secondary malignant neoplasm of liver and intrahepatic bile duct: Secondary | ICD-10-CM

## 2014-01-31 DIAGNOSIS — C252 Malignant neoplasm of tail of pancreas: Secondary | ICD-10-CM

## 2014-01-31 MED ORDER — OXYCODONE-ACETAMINOPHEN 5-325 MG PO TABS
1.0000 | ORAL_TABLET | ORAL | Status: DC | PRN
Start: 2014-01-31 — End: 2014-03-05

## 2014-01-31 NOTE — Telephone Encounter (Signed)
gv and printed appt sched and avs for pt for Aug and SEpt.....sed added tx. °

## 2014-01-31 NOTE — Progress Notes (Signed)
[facility] OFFICE PROGRESS NOTE   Diagnosis: Pancreas cancer  INTERVAL HISTORY:   [name] returns as scheduled. He has developed abdominal pain over the past 2 weeks. He takes hydrocodone twice daily and has partial relief of pain. The pain is in the left abdomen and left lower back. His appetite has diminished. He has an intermittent cough. No dyspnea or chest pain. No hemoptysis.  Objective:  Vital signs in last 24 hours:  Blood pressure 138/87, pulse 71, temperature 98.1 F (36.7 C), temperature source Oral, resp. rate 18, height 6\' 1"  (1.854 m), weight 211 lb 8 oz (95.936 kg).    HEENT: No thrush or ulcers Resp: Lungs clear bilaterally Cardio: Regular rate and rhythm with premature beats GI: No hepatosplenomegaly, tender in the mid and left abdomen, no mass Vascular: No leg edema  Portacath/PICC-without erythema  Lab Results:  Lab Results  Component Value Date   WBC 3.9* 12/20/2013   HGB 10.6* 12/20/2013   HCT 32.4* 12/20/2013   MCV 90.1 12/20/2013   PLT 138* 12/20/2013   NEUTROABS 2.5 12/20/2013     Medications: I have reviewed the patient's current medications.  Assessment/Plan: 1. Adenocarcinoma of the pancreas status post ultrasound-guided biopsy of a liver lesion 10/18/2012 consistent with metastatic pancreas cancer. CT 10/11/2012 revealed a pancreas tail mass and liver metastases. CA 19-9 in normal range on 10/24/2012. Initiation of gemcitabine/Abraxane on a day 1, day 8 day 15 schedule on 10/28/2012. Current schedule is day one/day 8.  Restaging CT 01/26/2013 revealed a decrease in the size of liver metastases and no new lesions  Gemcitabine/Abraxane continued on a day 1, day 8 schedule.  Restaging CT 04/25/2013 with a decrease in the size of the pancreas mass and liver lesions.  Gemcitabine/Abraxane continued on a day 1, day 8 schedule.  Restaging CT 08/08/2013-further regression of the pancreas mass, stable liver lesions.  Gemcitabine/Abraxane  continued on a day 1, day 8 schedule.  Gemcitabine/Abraxane changed to an every 2 week schedule beginning 10/11/2013.  Restaging CT abdomen/pelvis 12/26/2013 with interval enlargement of a peripherally enhancing lesion in the right hepatic lobe; stable lesion left hepatic lobe; interval mild progression of the mass involving the pancreatic tail. 2. Abdominal pain secondary to the pancreas mass. Progressive. 3. Anorexia/weight loss. He has again developed anorexia. 4. Diabetes. Exacerbated by steroid premedication. Decadron was reduced to 5 mg after cycle 1 day 1. 5. History of coronary artery disease. 6. Headache following cycle 1 day 1 gemcitabine/Abraxane. Question related to Zofran.  7. History of thrombocytopenia secondary to chemotherapy. 8. 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 Question secondary to Abraxane versus diabetes.  9. Depression-he declined an antidepressant. Counseling services were offered as well.   Disposition:  [name] has developed anorexia and increased pain. The restaging CT 12/26/2013 was consistent with disease progression. I discussed treatment options with [name]. He would like to proceed with a trial of salvage chemotherapy. I recommend FOLFOX. We will consider adding irinotecan after 2 cycles if he tolerates the FOLFOX well.  We reviewed the potential toxicities associated with the FOLFOX regimen including the chance for hematologic toxicity, mucositis, diarrhea, and alopecia. We discussed the rash, hyperpigmentation, and hand/foot syndrome associated with 5-fluorouracil. We reviewed the various types of neuropathy seen with oxaliplatin. He agrees to proceed.  A first cycle of FOLFOX will be scheduled for 02/07/2014. He will return for an office visit and cycle 2 on 02/21/2014.  He will  contact us for increased cough.  We prescribed oxycodone for pain.  [name], MD  01/31/2014  9:20 AM

## 2014-02-04 ENCOUNTER — Other Ambulatory Visit: Payer: Self-pay | Admitting: Oncology

## 2014-02-07 ENCOUNTER — Ambulatory Visit (HOSPITAL_BASED_OUTPATIENT_CLINIC_OR_DEPARTMENT_OTHER): Payer: Medicare Other

## 2014-02-07 ENCOUNTER — Other Ambulatory Visit (HOSPITAL_BASED_OUTPATIENT_CLINIC_OR_DEPARTMENT_OTHER): Payer: Medicare Other

## 2014-02-07 ENCOUNTER — Other Ambulatory Visit: Payer: Self-pay | Admitting: *Deleted

## 2014-02-07 VITALS — BP 129/76 | HR 68 | Temp 97.0°F | Resp 18

## 2014-02-07 DIAGNOSIS — C259 Malignant neoplasm of pancreas, unspecified: Secondary | ICD-10-CM

## 2014-02-07 DIAGNOSIS — Z452 Encounter for adjustment and management of vascular access device: Secondary | ICD-10-CM

## 2014-02-07 DIAGNOSIS — Z95828 Presence of other vascular implants and grafts: Secondary | ICD-10-CM

## 2014-02-07 DIAGNOSIS — Z5111 Encounter for antineoplastic chemotherapy: Secondary | ICD-10-CM

## 2014-02-07 DIAGNOSIS — C252 Malignant neoplasm of tail of pancreas: Secondary | ICD-10-CM

## 2014-02-07 LAB — CBC WITH DIFFERENTIAL/PLATELET
BASO%: 0.6 % (ref 0.0–2.0)
Basophils Absolute: 0 10*3/uL (ref 0.0–0.1)
EOS%: 2.2 % (ref 0.0–7.0)
Eosinophils Absolute: 0.1 10*3/uL (ref 0.0–0.5)
HCT: 36.2 % — ABNORMAL LOW (ref 38.4–49.9)
HGB: 11.7 g/dL — ABNORMAL LOW (ref 13.0–17.1)
LYMPH%: 21.2 % (ref 14.0–49.0)
MCH: 28.2 pg (ref 27.2–33.4)
MCHC: 32.4 g/dL (ref 32.0–36.0)
MCV: 86.9 fL (ref 79.3–98.0)
MONO#: 0.4 10*3/uL (ref 0.1–0.9)
MONO%: 9.5 % (ref 0.0–14.0)
NEUT#: 2.9 10*3/uL (ref 1.5–6.5)
NEUT%: 66.5 % (ref 39.0–75.0)
PLATELETS: 106 10*3/uL — AB (ref 140–400)
RBC: 4.16 10*6/uL — AB (ref 4.20–5.82)
RDW: 15.2 % — AB (ref 11.0–14.6)
WBC: 4.3 10*3/uL (ref 4.0–10.3)
lymph#: 0.9 10*3/uL (ref 0.9–3.3)

## 2014-02-07 LAB — COMPREHENSIVE METABOLIC PANEL (CC13)
ALT: 18 U/L (ref 0–55)
AST: 22 U/L (ref 5–34)
Albumin: 3.8 g/dL (ref 3.5–5.0)
Alkaline Phosphatase: 60 U/L (ref 40–150)
Anion Gap: 9 mEq/L (ref 3–11)
BILIRUBIN TOTAL: 0.45 mg/dL (ref 0.20–1.20)
BUN: 17.9 mg/dL (ref 7.0–26.0)
CO2: 24 mEq/L (ref 22–29)
Calcium: 9.4 mg/dL (ref 8.4–10.4)
Chloride: 106 mEq/L (ref 98–109)
Creatinine: 1.5 mg/dL — ABNORMAL HIGH (ref 0.7–1.3)
Glucose: 264 mg/dl — ABNORMAL HIGH (ref 70–140)
POTASSIUM: 4.6 meq/L (ref 3.5–5.1)
SODIUM: 139 meq/L (ref 136–145)
TOTAL PROTEIN: 7.1 g/dL (ref 6.4–8.3)

## 2014-02-07 MED ORDER — ONDANSETRON 16 MG/50ML IVPB (CHCC)
16.0000 mg | Freq: Once | INTRAVENOUS | Status: AC
  Administered 2014-02-07: 16 mg via INTRAVENOUS

## 2014-02-07 MED ORDER — DEXAMETHASONE SODIUM PHOSPHATE 20 MG/5ML IJ SOLN
20.0000 mg | Freq: Once | INTRAMUSCULAR | Status: AC
  Administered 2014-02-07: 20 mg via INTRAVENOUS

## 2014-02-07 MED ORDER — OXYCODONE-ACETAMINOPHEN 5-325 MG PO TABS
ORAL_TABLET | ORAL | Status: AC
  Filled 2014-02-07: qty 2

## 2014-02-07 MED ORDER — ONDANSETRON 16 MG/50ML IVPB (CHCC)
INTRAVENOUS | Status: AC
  Filled 2014-02-07: qty 16

## 2014-02-07 MED ORDER — SODIUM CHLORIDE 0.9 % IJ SOLN
10.0000 mL | INTRAMUSCULAR | Status: DC | PRN
  Administered 2014-02-07: 10 mL via INTRAVENOUS
  Filled 2014-02-07: qty 10

## 2014-02-07 MED ORDER — DEXTROSE 5 % IV SOLN
85.0000 mg/m2 | Freq: Once | INTRAVENOUS | Status: AC
  Administered 2014-02-07: 190 mg via INTRAVENOUS
  Filled 2014-02-07: qty 38

## 2014-02-07 MED ORDER — MORPHINE SULFATE ER 15 MG PO TBCR: 15.0000 mg | EXTENDED_RELEASE_TABLET | Freq: Two times a day (BID) | ORAL | Status: DC

## 2014-02-07 MED ORDER — LEUCOVORIN CALCIUM INJECTION 350 MG
400.0000 mg/m2 | Freq: Once | INTRAMUSCULAR | Status: AC
  Administered 2014-02-07: 888 mg via INTRAVENOUS
  Filled 2014-02-07: qty 44.4

## 2014-02-07 MED ORDER — DEXAMETHASONE SODIUM PHOSPHATE 20 MG/5ML IJ SOLN
INTRAMUSCULAR | Status: AC
  Filled 2014-02-07: qty 5

## 2014-02-07 MED ORDER — OXYCODONE-ACETAMINOPHEN 5-325 MG PO TABS
2.0000 | ORAL_TABLET | Freq: Once | ORAL | Status: AC
Start: 2014-02-07 — End: 2014-02-07
  Administered 2014-02-07: 2 via ORAL

## 2014-02-07 MED ORDER — FLUOROURACIL CHEMO INJECTION 5 GM/100ML
2400.0000 mg/m2 | INTRAVENOUS | Status: DC
  Administered 2014-02-07: 5350 mg via INTRAVENOUS
  Filled 2014-02-07: qty 107

## 2014-02-07 MED ORDER — DEXTROSE 5 % IV SOLN
Freq: Once | INTRAVENOUS | Status: AC
  Administered 2014-02-07: 11:00:00 via INTRAVENOUS

## 2014-02-07 NOTE — Patient Instructions (Signed)
[facility] Discharge Instructions for Patients Receiving Chemotherapy  Today you received the following chemotherapy agents: Oxaliplatin, Leucovorin, 5FU. You are going home with a chemo pump for 46 hours.  To help prevent nausea and vomiting after your treatment, we encourage you to take your nausea medication as prescribed.    If you develop nausea and vomiting that is not controlled by your nausea medication, call the clinic.   BELOW ARE SYMPTOMS THAT SHOULD BE REPORTED IMMEDIATELY:  *FEVER GREATER THAN 100.5 F  *CHILLS WITH OR WITHOUT FEVER  NAUSEA AND VOMITING THAT IS NOT CONTROLLED WITH YOUR NAUSEA MEDICATION  *UNUSUAL SHORTNESS OF BREATH  *UNUSUAL BRUISING OR BLEEDING  TENDERNESS IN MOUTH AND THROAT WITH OR WITHOUT PRESENCE OF ULCERS  *URINARY PROBLEMS  *BOWEL PROBLEMS  UNUSUAL RASH Items with * indicate a potential emergency and should be followed up as soon as possible.  Feel free to call the clinic you have any questions or concerns. The clinic phone number is [phone].

## 2014-02-07 NOTE — Patient Instructions (Signed)

## 2014-02-09 ENCOUNTER — Telehealth: Payer: Self-pay | Admitting: *Deleted

## 2014-02-09 ENCOUNTER — Ambulatory Visit (HOSPITAL_BASED_OUTPATIENT_CLINIC_OR_DEPARTMENT_OTHER): Payer: Medicare Other

## 2014-02-09 VITALS — BP 115/72 | HR 65

## 2014-02-09 DIAGNOSIS — C259 Malignant neoplasm of pancreas, unspecified: Secondary | ICD-10-CM

## 2014-02-09 DIAGNOSIS — Z452 Encounter for adjustment and management of vascular access device: Secondary | ICD-10-CM

## 2014-02-09 DIAGNOSIS — C252 Malignant neoplasm of tail of pancreas: Secondary | ICD-10-CM

## 2014-02-09 MED ORDER — HEPARIN SOD (PORK) LOCK FLUSH 100 UNIT/ML IV SOLN
500.0000 [IU] | Freq: Once | INTRAVENOUS | Status: AC | PRN
  Administered 2014-02-09: 500 [IU]
  Filled 2014-02-09: qty 5

## 2014-02-09 MED ORDER — SODIUM CHLORIDE 0.9 % IJ SOLN
10.0000 mL | INTRAMUSCULAR | Status: DC | PRN
  Administered 2014-02-09: 10 mL
  Filled 2014-02-09: qty 10

## 2014-02-09 NOTE — Telephone Encounter (Signed)
Pt returned nurse's call.  Stated he was doing fine.  Fair appetite and drinking lots of fluids as tolerated.  Pt also drinks supplements - Ensure, Boost.  Denied nausea/vomiting; bowel and bladder function fine.  Stated had pain this am in abdomen, pt took pain meds with relief.  Instructed pt to monitor for constipation problem with pain meds.  Pt voiced understanding.  Pt aware of next office appt.

## 2014-02-09 NOTE — Telephone Encounter (Signed)
Called pt at home for post chemo follow up.  Left message on voice mail requesting a call back. 

## 2014-02-11 ENCOUNTER — Encounter (HOSPITAL_COMMUNITY): Payer: Self-pay | Admitting: Emergency Medicine

## 2014-02-11 ENCOUNTER — Observation Stay (HOSPITAL_COMMUNITY)
Admission: EM | Admit: 2014-02-11 | Discharge: 2014-02-12 | Disposition: A | Discharge status: Home / Self Care | Payer: Medicare Other | Attending: Internal Medicine | Admitting: Internal Medicine

## 2014-02-11 DIAGNOSIS — Z87891 Personal history of nicotine dependence: Secondary | ICD-10-CM | POA: Insufficient documentation

## 2014-02-11 DIAGNOSIS — K602 Anal fissure, unspecified: Secondary | ICD-10-CM | POA: Insufficient documentation

## 2014-02-11 DIAGNOSIS — E1165 Type 2 diabetes mellitus with hyperglycemia: Secondary | ICD-10-CM

## 2014-02-11 DIAGNOSIS — I2510 Atherosclerotic heart disease of native coronary artery without angina pectoris: Secondary | ICD-10-CM | POA: Diagnosis not present

## 2014-02-11 DIAGNOSIS — R109 Unspecified abdominal pain: Secondary | ICD-10-CM

## 2014-02-11 DIAGNOSIS — Z8619 Personal history of other infectious and parasitic diseases: Secondary | ICD-10-CM | POA: Diagnosis not present

## 2014-02-11 DIAGNOSIS — Z951 Presence of aortocoronary bypass graft: Secondary | ICD-10-CM | POA: Diagnosis not present

## 2014-02-11 DIAGNOSIS — Z794 Long term (current) use of insulin: Secondary | ICD-10-CM | POA: Insufficient documentation

## 2014-02-11 DIAGNOSIS — Z79899 Other long term (current) drug therapy: Secondary | ICD-10-CM | POA: Insufficient documentation

## 2014-02-11 DIAGNOSIS — C259 Malignant neoplasm of pancreas, unspecified: Principal | ICD-10-CM

## 2014-02-11 DIAGNOSIS — Z7982 Long term (current) use of aspirin: Secondary | ICD-10-CM | POA: Insufficient documentation

## 2014-02-11 DIAGNOSIS — I10 Essential (primary) hypertension: Secondary | ICD-10-CM | POA: Diagnosis not present

## 2014-02-11 DIAGNOSIS — K259 Gastric ulcer, unspecified as acute or chronic, without hemorrhage or perforation: Secondary | ICD-10-CM | POA: Diagnosis not present

## 2014-02-11 DIAGNOSIS — Z87442 Personal history of urinary calculi: Secondary | ICD-10-CM | POA: Diagnosis not present

## 2014-02-11 DIAGNOSIS — E118 Type 2 diabetes mellitus with unspecified complications: Secondary | ICD-10-CM | POA: Diagnosis present

## 2014-02-11 DIAGNOSIS — K5732 Diverticulitis of large intestine without perforation or abscess without bleeding: Secondary | ICD-10-CM | POA: Insufficient documentation

## 2014-02-11 DIAGNOSIS — G893 Neoplasm related pain (acute) (chronic): Secondary | ICD-10-CM

## 2014-02-11 DIAGNOSIS — I779 Disorder of arteries and arterioles, unspecified: Secondary | ICD-10-CM | POA: Diagnosis not present

## 2014-02-11 DIAGNOSIS — IMO0002 Reserved for concepts with insufficient information to code with codable children: Secondary | ICD-10-CM

## 2014-02-11 DIAGNOSIS — D696 Thrombocytopenia, unspecified: Secondary | ICD-10-CM

## 2014-02-11 DIAGNOSIS — E785 Hyperlipidemia, unspecified: Secondary | ICD-10-CM | POA: Diagnosis not present

## 2014-02-11 DIAGNOSIS — C257 Malignant neoplasm of other parts of pancreas: Secondary | ICD-10-CM | POA: Diagnosis present

## 2014-02-11 DIAGNOSIS — E119 Type 2 diabetes mellitus without complications: Secondary | ICD-10-CM

## 2014-02-11 NOTE — ED Notes (Signed)
Pt arrived to the Ed with a complaint of abdominal pain.  Pt had his first cancer treatment on Friday.  Pt states pain is overwhelming the pain medication prescribed to him.  Pt's pain is located the left side with radiation to the back.

## 2014-02-11 NOTE — ED Notes (Signed)
Pt took 10 mg percocet at home at 2230, 15 mg ER morphine at 2100 w/o relief.  Pt reports vomiting x 5.

## 2014-02-12 ENCOUNTER — Inpatient Hospital Stay (HOSPITAL_COMMUNITY): Payer: Medicare Other

## 2014-02-12 ENCOUNTER — Encounter (HOSPITAL_COMMUNITY): Payer: Self-pay | Admitting: Internal Medicine

## 2014-02-12 DIAGNOSIS — IMO0001 Reserved for inherently not codable concepts without codable children: Secondary | ICD-10-CM

## 2014-02-12 DIAGNOSIS — R109 Unspecified abdominal pain: Secondary | ICD-10-CM | POA: Diagnosis present

## 2014-02-12 DIAGNOSIS — D649 Anemia, unspecified: Secondary | ICD-10-CM

## 2014-02-12 DIAGNOSIS — D696 Thrombocytopenia, unspecified: Secondary | ICD-10-CM

## 2014-02-12 DIAGNOSIS — E118 Type 2 diabetes mellitus with unspecified complications: Secondary | ICD-10-CM | POA: Diagnosis present

## 2014-02-12 DIAGNOSIS — M549 Dorsalgia, unspecified: Secondary | ICD-10-CM

## 2014-02-12 DIAGNOSIS — G893 Neoplasm related pain (acute) (chronic): Secondary | ICD-10-CM

## 2014-02-12 DIAGNOSIS — C259 Malignant neoplasm of pancreas, unspecified: Principal | ICD-10-CM

## 2014-02-12 DIAGNOSIS — E119 Type 2 diabetes mellitus without complications: Secondary | ICD-10-CM

## 2014-02-12 DIAGNOSIS — C252 Malignant neoplasm of tail of pancreas: Secondary | ICD-10-CM

## 2014-02-12 DIAGNOSIS — E1165 Type 2 diabetes mellitus with hyperglycemia: Secondary | ICD-10-CM

## 2014-02-12 DIAGNOSIS — C787 Secondary malignant neoplasm of liver and intrahepatic bile duct: Secondary | ICD-10-CM

## 2014-02-12 LAB — COMPREHENSIVE METABOLIC PANEL
ALK PHOS: 64 U/L (ref 39–117)
ALT: 23 U/L (ref 0–53)
ANION GAP: 15 (ref 5–15)
AST: 23 U/L (ref 0–37)
Albumin: 3.9 g/dL (ref 3.5–5.2)
BUN: 29 mg/dL — AB (ref 6–23)
CALCIUM: 9.6 mg/dL (ref 8.4–10.5)
CO2: 23 meq/L (ref 19–32)
Chloride: 100 mEq/L (ref 96–112)
Creatinine, Ser: 1.29 mg/dL (ref 0.50–1.35)
GFR, EST AFRICAN AMERICAN: 60 mL/min — AB (ref >90)
GFR, EST NON AFRICAN AMERICAN: 52 mL/min — AB (ref >90)
Glucose, Bld: 318 mg/dL — ABNORMAL HIGH (ref 70–99)
POTASSIUM: 4.4 meq/L (ref 3.7–5.3)
SODIUM: 138 meq/L (ref 137–147)
TOTAL PROTEIN: 7.2 g/dL (ref 6.0–8.3)
Total Bilirubin: 0.4 mg/dL (ref 0.3–1.2)

## 2014-02-12 LAB — CBC
HCT: 34.8 % — ABNORMAL LOW (ref 39.0–52.0)
Hemoglobin: 11.6 g/dL — ABNORMAL LOW (ref 13.0–17.0)
MCH: 28.5 pg (ref 26.0–34.0)
MCHC: 33.3 g/dL (ref 30.0–36.0)
MCV: 85.5 fL (ref 78.0–100.0)
PLATELETS: 91 10*3/uL — AB (ref 150–400)
RBC: 4.07 MIL/uL — ABNORMAL LOW (ref 4.22–5.81)
RDW: 13.8 % (ref 11.5–15.5)
WBC: 4.6 10*3/uL (ref 4.0–10.5)

## 2014-02-12 LAB — DIFFERENTIAL
Basophils Absolute: 0 10*3/uL (ref 0.0–0.1)
Basophils Relative: 0 % (ref 0–1)
Eosinophils Absolute: 0.1 10*3/uL (ref 0.0–0.7)
Eosinophils Relative: 1 % (ref 0–5)
LYMPHS PCT: 21 % (ref 12–46)
Lymphs Abs: 0.9 10*3/uL (ref 0.7–4.0)
MONO ABS: 0.2 10*3/uL (ref 0.1–1.0)
Monocytes Relative: 5 % (ref 3–12)
Neutro Abs: 3.2 10*3/uL (ref 1.7–7.7)
Neutrophils Relative %: 73 % (ref 43–77)

## 2014-02-12 LAB — LIPASE, BLOOD: Lipase: 49 U/L (ref 11–59)

## 2014-02-12 LAB — GLUCOSE, CAPILLARY: GLUCOSE-CAPILLARY: 224 mg/dL — AB (ref 70–99)

## 2014-02-12 MED ORDER — SORBITOL 70 % SOLN: 30.0000 mL | Freq: Every day | Status: DC | PRN

## 2014-02-12 MED ORDER — ATORVASTATIN CALCIUM 40 MG PO TABS
40.0000 mg | ORAL_TABLET | Freq: Every day | ORAL | Status: DC
  Filled 2014-02-12: qty 1

## 2014-02-12 MED ORDER — INSULIN DETEMIR 100 UNIT/ML ~~LOC~~ SOLN: 25.0000 [IU] | Freq: Every day | SUBCUTANEOUS | Status: DC

## 2014-02-12 MED ORDER — PANTOPRAZOLE SODIUM 40 MG IV SOLR
40.0000 mg | INTRAVENOUS | Status: DC
  Administered 2014-02-12: 40 mg via INTRAVENOUS
  Filled 2014-02-12 (×2): qty 40

## 2014-02-12 MED ORDER — HYDROMORPHONE HCL PF 1 MG/ML IJ SOLN: 1.0000 mg | INTRAMUSCULAR | Status: DC | PRN

## 2014-02-12 MED ORDER — HYDROMORPHONE HCL PF 1 MG/ML IJ SOLN
1.0000 mg | Freq: Once | INTRAMUSCULAR | Status: AC
  Administered 2014-02-12: 1 mg via INTRAVENOUS
  Filled 2014-02-12: qty 1

## 2014-02-12 MED ORDER — LIDOCAINE-PRILOCAINE 2.5-2.5 % EX CREA: TOPICAL_CREAM | CUTANEOUS | Status: DC | PRN

## 2014-02-12 MED ORDER — HYDROMORPHONE HCL PF 1 MG/ML IJ SOLN
1.0000 mg | INTRAMUSCULAR | Status: DC | PRN
  Administered 2014-02-12: 1 mg via INTRAVENOUS
  Filled 2014-02-12: qty 1

## 2014-02-12 MED ORDER — PROCHLORPERAZINE MALEATE 10 MG PO TABS: 10.0000 mg | ORAL_TABLET | Freq: Four times a day (QID) | ORAL | Status: DC | PRN

## 2014-02-12 MED ORDER — ACETAMINOPHEN 325 MG PO TABS: 650.0000 mg | ORAL_TABLET | Freq: Four times a day (QID) | ORAL | Status: DC | PRN

## 2014-02-12 MED ORDER — DIPHENHYDRAMINE-APAP (SLEEP) 25-500 MG PO TABS: 2.0000 | ORAL_TABLET | Freq: Every evening | ORAL | Status: DC | PRN

## 2014-02-12 MED ORDER — ONDANSETRON HCL 4 MG/2ML IJ SOLN: 4.0000 mg | Freq: Three times a day (TID) | INTRAMUSCULAR | Status: DC | PRN

## 2014-02-12 MED ORDER — FISH OIL 1200 MG PO CAPS: 1.0000 | ORAL_CAPSULE | Freq: Two times a day (BID) | ORAL | Status: DC

## 2014-02-12 MED ORDER — ZOLPIDEM TARTRATE 5 MG PO TABS
2.5000 mg | ORAL_TABLET | Freq: Every evening | ORAL | Status: DC | PRN
  Administered 2014-02-12: 2.5 mg via ORAL
  Filled 2014-02-12: qty 1

## 2014-02-12 MED ORDER — ACETAMINOPHEN 650 MG RE SUPP: 650.0000 mg | Freq: Four times a day (QID) | RECTAL | Status: DC | PRN

## 2014-02-12 MED ORDER — NITROGLYCERIN 0.4 MG SL SUBL: 0.4000 mg | SUBLINGUAL_TABLET | SUBLINGUAL | Status: DC | PRN

## 2014-02-12 MED ORDER — ISOSORBIDE MONONITRATE ER 60 MG PO TB24
60.0000 mg | ORAL_TABLET | Freq: Every morning | ORAL | Status: DC
  Administered 2014-02-12: 60 mg via ORAL
  Filled 2014-02-12: qty 1

## 2014-02-12 MED ORDER — LIDOCAINE-PRILOCAINE 2.5-2.5 % EX CREA
TOPICAL_CREAM | Freq: Once | CUTANEOUS | Status: AC
  Administered 2014-02-12: 05:00:00 via TOPICAL
  Filled 2014-02-12: qty 5

## 2014-02-12 MED ORDER — SODIUM CHLORIDE 0.9 % IV SOLN
INTRAVENOUS | Status: DC
  Administered 2014-02-12: 05:00:00 via INTRAVENOUS

## 2014-02-12 MED ORDER — MORPHINE SULFATE ER 15 MG PO TBCR
15.0000 mg | EXTENDED_RELEASE_TABLET | Freq: Two times a day (BID) | ORAL | Status: DC
Start: 2014-02-12 — End: 2014-02-12
  Administered 2014-02-12: 15 mg via ORAL
  Filled 2014-02-12: qty 1

## 2014-02-12 MED ORDER — ENOXAPARIN SODIUM 40 MG/0.4ML ~~LOC~~ SOLN
40.0000 mg | SUBCUTANEOUS | Status: DC
Start: 2014-02-12 — End: 2014-02-12
  Filled 2014-02-12: qty 0.4

## 2014-02-12 MED ORDER — ONDANSETRON HCL 4 MG/2ML IJ SOLN
4.0000 mg | Freq: Once | INTRAMUSCULAR | Status: AC
  Administered 2014-02-12: 4 mg via INTRAVENOUS
  Filled 2014-02-12: qty 2

## 2014-02-12 MED ORDER — INSULIN ASPART 100 UNIT/ML ~~LOC~~ SOLN
0.0000 [IU] | Freq: Three times a day (TID) | SUBCUTANEOUS | Status: DC
  Administered 2014-02-12: 3 [IU] via SUBCUTANEOUS

## 2014-02-12 MED ORDER — ASPIRIN EC 81 MG PO TBEC
81.0000 mg | DELAYED_RELEASE_TABLET | Freq: Every day | ORAL | Status: DC
  Administered 2014-02-12: 81 mg via ORAL
  Filled 2014-02-12: qty 1

## 2014-02-12 MED ORDER — BISACODYL 5 MG PO TBEC: 5.0000 mg | DELAYED_RELEASE_TABLET | Freq: Every day | ORAL | Status: DC | PRN

## 2014-02-12 MED ORDER — ONDANSETRON HCL 4 MG/2ML IJ SOLN: 4.0000 mg | Freq: Four times a day (QID) | INTRAMUSCULAR | Status: DC | PRN

## 2014-02-12 MED ORDER — OMEGA-3-ACID ETHYL ESTERS 1 G PO CAPS
1.0000 g | ORAL_CAPSULE | Freq: Two times a day (BID) | ORAL | Status: DC
  Administered 2014-02-12: 1 g via ORAL
  Filled 2014-02-12 (×2): qty 1

## 2014-02-12 MED ORDER — ONDANSETRON HCL 4 MG PO TABS: 4.0000 mg | ORAL_TABLET | Freq: Four times a day (QID) | ORAL | Status: DC | PRN

## 2014-02-12 NOTE — H&P (Signed)
[facility] History and Physical  MARDELL CRAGG ZTI:[id number] DOB: 1935/04/23 DOA: 02/11/2014  Referring physician: ER physician. PCP: Mathews Argyle, MD   Chief Complaint: Abdominal pain.  HPI: Christian Kaiser is a 79 y.o. male with history of metastatic pancreatic cancer recently placed on chemotherapy on Wednesday started developing abdominal pain pain with nausea vomiting since Friday 2 days ago. Patient's pain is most of the left flank and left groin area. Patient denies any fever chills chest pain or shortness of breath. Patient states the pain is really worsened. He came to the ER because of uncontrollable pain despite taking his home medication. In the ER he was given Dilaudid for which pain improved but still persisted and has been admitted for pain management. Patient stated pain his left flank radiating to the groin. Denies any dysuria or discharges. Last bowel movement was yesterday morning.  Review of Systems: As presented in the history of presenting illness, rest negative.  Past Medical History  Diagnosis Date  . Diabetes mellitus     type 2  . Kidney stones   . HTN (hypertension)   . HLD (hyperlipidemia)   . CAD (coronary artery disease)     s/p 4v CABG 1990s and redo CABG 2006; cath 2007 showed patent grafts  . Carotid disease, bilateral     06/2010 carotid doppler <40% RICA stenosis and 82-50% LICA stenosis  . Gastric ulcer 2003  . Diverticulitis   . Anal fissure     s/p repair  . Shingles   . Cervical spondylosis   . Sacroiliitis   . Pancreas cancer 10/2012   Past Surgical History  Procedure Laterality Date  . Coronary artery bypass graft      1990s (SVG OM2/OM3, SVG to RCA, LIMA to LAD) and redo 2006 (reverse SVT to OM & distal LCx, reverse SVG to PDA, preservation of LIMA)  . Rotator cuff repair      Left  . Knee arthroscopy      Right   Social History:  reports that he quit smoking about 11 years ago. He does not have any smokeless tobacco  history on file. He reports that he does not drink alcohol or use illicit drugs. Where does patient live home. Can patient participate in ADLs? Yes.  Allergies  Allergen Reactions  . Altace [Ramipril]     cough     Family History:  Family History  Problem Relation Age of Onset  . Cancer Father       Prior to Admission medications   Medication Sig Start Date End Date Taking? Authorizing Provider  Artificial Tear Solution (JUST TEARS EYE DROPS OP) Apply 1 drop to eye at bedtime.   Yes Historical Provider, MD  aspirin EC 81 MG tablet Take 81 mg by mouth daily.   Yes Historical Provider, MD  bisacodyl (DULCOLAX) 5 MG EC tablet Take 5 mg by mouth daily as needed for mild constipation or moderate constipation.   Yes Historical Provider, MD  diphenhydramine-acetaminophen (TYLENOL PM) 25-500 MG TABS Take 2 tablets by mouth at bedtime as needed (sleep).   Yes Historical Provider, MD  glimepiride (AMARYL) 4 MG tablet Take 4 mg by mouth 2 (two) times daily.   Yes Historical Provider, MD  insulin detemir (LEVEMIR) 100 UNIT/ML injection Inject 40 Units into the skin at bedtime.    Yes Historical Provider, MD  isosorbide mononitrate (IMDUR) 60 MG 24 hr tablet Take 1 tablet (60 mg total) by mouth every morning. 07/03/13  Yes Mallie Mussel  Nicholes Stairs III, MD  lidocaine-prilocaine (EMLA) cream Apply topically as needed. apply over port 1-2 hours prior to chemotherapy. 05/17/13  Yes Owens Shark, NP  metFORMIN (GLUCOPHAGE) 1000 MG tablet Take 1,000 mg by mouth 2 (two) times daily with a meal.   Yes Historical Provider, MD  morphine (MS CONTIN) 15 MG 12 hr tablet Take 1 tablet (15 mg total) by mouth every 12 (twelve) hours. 02/07/14  Yes Ladell Pier, MD  nitroGLYCERIN (NITROSTAT) 0.4 MG SL tablet Place 0.4 mg under the tongue every 5 (five) minutes as needed for chest pain.   Yes Historical Provider, MD  Omega-3 Fatty Acids (FISH OIL) 1200 MG CAPS Take 1 capsule by mouth 2 (two) times daily.    Yes Historical  Provider, MD  oxyCODONE-acetaminophen (PERCOCET/ROXICET) 5-325 MG per tablet Take 1-2 tablets by mouth every 4 (four) hours as needed for severe pain. 01/31/14  Yes Ladell Pier, MD  prochlorperazine (COMPAZINE) 10 MG tablet Take 1 tablet (10 mg total) by mouth every 6 (six) hours as needed (nausea). 10/28/12  Yes Ladell Pier, MD  simvastatin (ZOCOR) 80 MG tablet Take 80 mg by mouth at bedtime.   Yes Historical Provider, MD  Lancets Glory Rosebush ULTRASOFT) lancets  05/02/13   Historical Provider, MD  NOVOFINE 32G X 6 MM MISC  08/28/13   Historical Provider, MD  ONE TOUCH ULTRA TEST test strip  05/30/13   Historical Provider, MD    Physical Exam: Filed Vitals:   02/12/14 0200 02/12/14 0245 02/12/14 0330 02/12/14 0357  BP: 152/90 153/89 151/89 150/98  Pulse: 67 65 78 68  Temp:    97.6 F (36.4 C)  TempSrc:    Oral  Resp: 15 11 18 18   Height:    6\' 1"  (1.854 m)  Weight:    92.443 kg (203 lb 12.8 oz)  SpO2: 98% 100% 96% 98%     General:  Well-built and nourished.  Eyes: Anicteric no pallor.  ENT: No discharge from the ears eyes nose mouth.  Neck: No mass felt.  Cardiovascular: S1-S2 heard.  Respiratory: No rhonchi or crepitations.  Abdomen: Soft nontender bowel sounds present no guarding rigidity.  Skin: No obvious rash.  Musculoskeletal: No edema.  Psychiatric: Appears normal.  Neurologic: Alert awake oriented to time place and person. Moves all extremities.  Labs on Admission:  Basic Metabolic Panel:  Recent Labs Lab 02/07/14 0942 02/12/14 0034  NA 139 138  K 4.6 4.4  CL  --  100  CO2 24 23  GLUCOSE 264* 318*  BUN 17.9 29*  CREATININE 1.5* 1.29  CALCIUM 9.4 9.6   Liver Function Tests:  Recent Labs Lab 02/07/14 0942 02/12/14 0034  AST 22 23  ALT 18 23  ALKPHOS 60 64  BILITOT 0.45 0.4  PROT 7.1 7.2  ALBUMIN 3.8 3.9    Recent Labs Lab 02/12/14 0034  LIPASE 49   No results found for this basename: AMMONIA,  in the last 168  hours CBC:  Recent Labs Lab 02/07/14 0942 02/12/14 0034  WBC 4.3 4.6  NEUTROABS 2.9  --   HGB 11.7* 11.6*  HCT 36.2* 34.8*  MCV 86.9 85.5  PLT 106* 91*   Cardiac Enzymes: No results found for this basename: CKTOTAL, CKMB, CKMBINDEX, TROPONINI,  in the last 168 hours  BNP (last 3 results) No results found for this basename: PROBNP,  in the last 8760 hours CBG: No results found for this basename: GLUCAP,  in the last 168 hours  Radiological Exams on Admission: No results found.   Assessment/Plan Principal Problem:   Abdominal pain Active Problems:   CAD (coronary artery disease)   Malignant neoplasm of pancreas, part unspecified   Diabetes mellitus type 2, controlled   1. Abdominal pain history of metastatic pancreatic cancer - most likely pain related to cancer. Patient also has recently received chemotherapy. Since patient has had previous history of ureteral stone CT scan has ordered to check for any obstructing stone, less likely. Patient has been placed on Dilaudid along with his home medications. I have listed Dr. Benay Spice in the oncology consult. 2. Diabetes mellitus  - blood sugar has been found to be high but patient has not taken his Lantus last night. I have decreased patient's Lantus dose from 40-25 because patient is stating is not able to eat well. Closely follow CBGs with sliding-scale. For now I am holding off patient's oral antidiabetic. 3. Anemia and thrombocytopenia - probably related to chemotherapy. Follow CBC. Patient also states that couple of vomiting episodes were dark in color. Protonix. 4. CAD - denies any chest pain. 5. Hyperlipidemia - on statins.    Code Status: Full code.  Family Communication: Patient's wife at the bedside.  Disposition Plan: Admit to inpatient.    KAKRAKANDY,ARSHAD N. [facility] Pager [phone].  If 7PM-7AM, please contact night-coverage [email or web address] Password [email or web address] 02/12/2014, 4:30 AM

## 2014-02-12 NOTE — Discharge Summary (Signed)
Physician Discharge Summary  Talbert [facility] QIO:[id number] DOB: 07-15-1934 DOA: 02/11/2014  PCP: Mathews Argyle, MD  Admit date: 02/11/2014 Discharge date: 02/12/2014  Time spent: 35 minutes  Recommendations for Outpatient Follow-up:  1. Follow up with PCP in 1-2 mos 2. Follow up with primary Oncologist as scheduled  Discharge Diagnoses:  Principal Problem:   Abdominal pain Active Problems:   CAD (coronary artery disease)   Malignant neoplasm of pancreas, part unspecified   Diabetes mellitus type 2, controlled   Discharge Condition: Improved  Diet recommendation: Regular, as tolerated  Filed Weights   02/11/14 2223 02/12/14 0357  Weight: 95.709 kg (211 lb) 92.443 kg (203 lb 12.8 oz)    History of present illness:  See admit from earlier this AM for details. Briefly, pt presents with a hx of known pancreatic CA with abd pain.   Hospital Course:  The patient was admitted to the floor. Pt was continued on dilauded and was seen by Oncologist, Dr. Benay Spice. Pt underwent imaging of the abd which revealed stable pancreatic mass. No kidney stones seen and no signs of bowel obstruction. Overnight, pt reported a bowel movement which resulted in improvement in symptoms. Pt has remained medically stable for discharge. Bowel regimen will be advised.  Consultations:  Oncology  Discharge Exam: Filed Vitals:   02/12/14 0200 02/12/14 0245 02/12/14 0330 02/12/14 0357  BP: 152/90 153/89 151/89 150/98  Pulse: 67 65 78 68  Temp:    97.6 F (36.4 C)  TempSrc:    Oral  Resp: 15 11 18 18   Height:    6\' 1"  (3.244 m)  Weight:    92.443 kg (203 lb 12.8 oz)  SpO2: 98% 100% 96% 98%    General: Awake, in nad Cardiovascular: regular,s 1, s2 Respiratory: normal resp effort, no wheezing  Discharge Instructions     Medication List         aspirin EC 81 MG tablet  Take 81 mg by mouth daily.     bisacodyl 5 MG EC tablet  Commonly known as:  DULCOLAX  Take 5 mg by mouth daily as  needed for mild constipation or moderate constipation.     diphenhydramine-acetaminophen 25-500 MG Tabs  Commonly known as:  TYLENOL PM  Take 2 tablets by mouth at bedtime as needed (sleep).     Fish Oil 1200 MG Caps  Take 1 capsule by mouth 2 (two) times daily.     glimepiride 4 MG tablet  Commonly known as:  AMARYL  Take 4 mg by mouth 2 (two) times daily.     insulin detemir 100 UNIT/ML injection  Commonly known as:  LEVEMIR  Inject 40 Units into the skin at bedtime.     isosorbide mononitrate 60 MG 24 hr tablet  Commonly known as:  IMDUR  Take 1 tablet (60 mg total) by mouth every morning.     JUST TEARS EYE DROPS OP  Apply 1 drop to eye at bedtime.     lidocaine-prilocaine cream  Commonly known as:  EMLA  Apply topically as needed. apply over port 1-2 hours prior to chemotherapy.     metFORMIN 1000 MG tablet  Commonly known as:  GLUCOPHAGE  Take 1,000 mg by mouth 2 (two) times daily with a meal.     morphine 15 MG 12 hr tablet  Commonly known as:  MS CONTIN  Take 1 tablet (15 mg total) by mouth every 12 (twelve) hours.     nitroGLYCERIN 0.4 MG SL tablet  Commonly  known as:  NITROSTAT  Place 0.4 mg under the tongue every 5 (five) minutes as needed for chest pain.     NOVOFINE 32G X 6 MM Misc  Generic drug:  Insulin Pen Needle     ONE TOUCH ULTRA TEST test strip  Generic drug:  glucose blood     onetouch ultrasoft lancets     oxyCODONE-acetaminophen 5-325 MG per tablet  Commonly known as:  PERCOCET/ROXICET  Take 1-2 tablets by mouth every 4 (four) hours as needed for severe pain.     prochlorperazine 10 MG tablet  Commonly known as:  COMPAZINE  Take 1 tablet (10 mg total) by mouth every 6 (six) hours as needed (nausea).     simvastatin 80 MG tablet  Commonly known as:  ZOCOR  Take 80 mg by mouth at bedtime.     sorbitol 70 % Soln  Take 30 mLs by mouth daily as needed for moderate constipation.       Allergies  Allergen Reactions  . Altace  [Ramipril]     cough    Follow-up Information   Follow up with Mathews Argyle, MD. Schedule an appointment as soon as possible for a visit in 1 week.   Specialty:  Internal Medicine   Contact information:   [address] [place] [address] [phone]       Follow up with [name], MD. (as scheduled)    Specialty:  Oncology   Contact information:   [place] [address] [phone]        The results of significant diagnostics from this hospitalization (including imaging, microbiology, ancillary and laboratory) are listed below for reference.    Significant Diagnostic Studies: Ct Abdomen Pelvis Wo Contrast  02/12/2014   CLINICAL DATA:  History pancreatic cancer diagnosed May 2014 with ongoing chemotherapy assess for hydronephrosis. Epigastric/right lower quadrant pain and nausea.  EXAM: CT ABDOMEN AND PELVIS WITHOUT CONTRAST  TECHNIQUE: Multidetector CT imaging of the abdomen and pelvis was performed following the standard protocol without IV contrast.  COMPARISON:  12/26/2013 and 08/02/2013 as well as 06/27/2009  FINDINGS: Lung bases demonstrate 2 stable nodular opacities over the posterior left lower lobe measuring 1 and 1.2 cm respectively. Stable 2 mm nodular density along the left major fissure. Mild coronary artery calcifications present.  Abdominal images demonstrate evidence of patient's known metastatic liver disease with an ill-defined hypodense mass over the central right lobe measuring 3.2 cm in diameter on this noncontrast exam. Patient's known lateral segment left lobe lesion measures 1.7 cm as these measure slightly larger compared to the previous contrast-enhanced exam. No definite new liver lesions are visualized. There is evidence of patient's low density mass over the distal body/ tail of the pancreas with adjacent calcification not well-defined on this exam without intravenous contrast. There is mild stable prominence  of the adrenal glands with stable 1.7 cm mass over the lateral limb of the left adrenal gland versus adjacent lymph node. The spleen and gallbladder are within normal.  The kidneys are normal in size without evidence of hydronephrosis. There is no perinephric inflammation or fluid. There is no change in a 2.8 cm cyst over the upper pole right kidney with a 9 mm calcification along its superior surface. No evidence of nephrolithiasis. Stable exophytic 1 cm cyst over the mid pole of the left kidney. Ureters are within normal.  The appendix is normal. There is diverticulosis of the colon. There is calcified plaque over the abdominal  aorta and iliac arteries. There is mild aneurysmal dilatation of the distal infrarenal abdominal aorta measuring 3.1 cm in AP diameter.  Pelvic images demonstrate the bladder, rectum and prostate to be within normal. There is no free fluid. There are degenerative changes of the spine and hips.  IMPRESSION: Evidence of patient's known hypodense mass over the distal body/ tail the pancreas with mild adjacent calcification without significant change on this noncontrast exam. Two known ill-defined hypodense liver metastases with the larger over the right lobe measuring 3.2 cm which appears slightly larger compared to the recent prior exam.  Stable nodular densities over the posterior left lower lobe.  1.7 cm Stable nodule over the lateral limb of the left adrenal gland versus adjacent lymph node.  Stable bilateral renal cysts.  Diverticulosis of the colon.  3.1 cm infrarenal abdominal aortic aneurysm unchanged.   Electronically Signed   By: Marin Olp M.D.   On: 02/12/2014 08:56   Dg Abd Portable 1v  02/12/2014   CLINICAL DATA:  Left lower quadrant abdominal pain  EXAM: PORTABLE ABDOMEN - 1 VIEW  COMPARISON:  None.  FINDINGS: The bowel gas pattern is normal. No radio-opaque calculi or other significant radiographic abnormality are seen.  IMPRESSION: Negative.   Electronically Signed   By:  Kerby Moors M.D.   On: 02/12/2014 08:18    Microbiology: No results found for this or any previous visit (from the past 240 hour(s)).   Labs: Basic Metabolic Panel:  Recent Labs Lab 02/07/14 0942 02/12/14 0034  NA 139 138  K 4.6 4.4  CL  --  100  CO2 24 23  GLUCOSE 264* 318*  BUN 17.9 29*  CREATININE 1.5* 1.29  CALCIUM 9.4 9.6   Liver Function Tests:  Recent Labs Lab 02/07/14 0942 02/12/14 0034  AST 22 23  ALT 18 23  ALKPHOS 60 64  BILITOT 0.45 0.4  PROT 7.1 7.2  ALBUMIN 3.8 3.9    Recent Labs Lab 02/12/14 0034  LIPASE 49   No results found for this basename: AMMONIA,  in the last 168 hours CBC:  Recent Labs Lab 02/07/14 0942 02/12/14 0034  WBC 4.3 4.6  NEUTROABS 2.9 3.2  HGB 11.7* 11.6*  HCT 36.2* 34.8*  MCV 86.9 85.5  PLT 106* 91*   Cardiac Enzymes: No results found for this basename: CKTOTAL, CKMB, CKMBINDEX, TROPONINI,  in the last 168 hours BNP: BNP (last 3 results) No results found for this basename: PROBNP,  in the last 8760 hours CBG:  Recent Labs Lab 02/12/14 0740  GLUCAP 224*   Signed:  CHIU, STEPHEN K  [facility] 02/12/2014, 11:24 AM

## 2014-02-12 NOTE — Progress Notes (Signed)
UR completed 

## 2014-02-12 NOTE — ED Provider Notes (Signed)
CSN: [id number]     Arrival date & time 02/11/14  2205 History   First MD Initiated Contact with Patient 02/11/14 2304     Chief Complaint  Patient presents with  . Abdominal Pain     (Consider location/radiation/quality/duration/timing/severity/associated sxs/prior Treatment) HPI 79 year old male presents to emergency department from home with complaint of severe abdominal pain.  Patient has history of pancreatic cancer, started on new chemotherapy regimen on Friday.  Patient on morphine twice a day and Percocet for breakthrough.  He reports no relief over the weekend.  He has had some nausea and vomiting but none currently.  He denies any fever or chills.  Pain in epigastrium and right upper quadrant. Past Medical History  Diagnosis Date  . Diabetes mellitus     type 2  . Kidney stones   . HTN (hypertension)   . HLD (hyperlipidemia)   . CAD (coronary artery disease)     s/p 4v CABG 1990s and redo CABG 2006; cath 2007 showed patent grafts  . Carotid disease, bilateral     06/2010 carotid doppler <40% RICA stenosis and 95-18% LICA stenosis  . Gastric ulcer 2003  . Diverticulitis   . Anal fissure     s/p repair  . Shingles   . Cervical spondylosis   . Sacroiliitis   . Pancreas cancer 10/2012   Past Surgical History  Procedure Laterality Date  . Coronary artery bypass graft      1990s (SVG OM2/OM3, SVG to RCA, LIMA to LAD) and redo 2006 (reverse SVT to OM & distal LCx, reverse SVG to PDA, preservation of LIMA)  . Rotator cuff repair      Left  . Knee arthroscopy      Right   Family History  Problem Relation Age of Onset  . Cancer Father    History  Substance Use Topics  . Smoking status: Former Smoker    Quit date: 10/19/2002  . Smokeless tobacco: Not on file  . Alcohol Use: No    Review of Systems  See History of Present Illness; otherwise all other systems are reviewed and negative   Allergies  Altace  Home Medications   Prior to Admission medications    Medication Sig Start Date End Date Taking? Authorizing Provider  Artificial Tear Solution (JUST TEARS EYE DROPS OP) Apply 1 drop to eye at bedtime.   Yes Historical Provider, MD  aspirin EC 81 MG tablet Take 81 mg by mouth daily.   Yes Historical Provider, MD  bisacodyl (DULCOLAX) 5 MG EC tablet Take 5 mg by mouth daily as needed for mild constipation or moderate constipation.   Yes Historical Provider, MD  diphenhydramine-acetaminophen (TYLENOL PM) 25-500 MG TABS Take 2 tablets by mouth at bedtime as needed (sleep).   Yes Historical Provider, MD  glimepiride (AMARYL) 4 MG tablet Take 4 mg by mouth 2 (two) times daily.   Yes Historical Provider, MD  insulin detemir (LEVEMIR) 100 UNIT/ML injection Inject 40 Units into the skin at bedtime.    Yes Historical Provider, MD  isosorbide mononitrate (IMDUR) 60 MG 24 hr tablet Take 1 tablet (60 mg total) by mouth every morning. 07/03/13  Yes Belva Crome III, MD  lidocaine-prilocaine (EMLA) cream Apply topically as needed. apply over port 1-2 hours prior to chemotherapy. 05/17/13  Yes Owens Shark, NP  metFORMIN (GLUCOPHAGE) 1000 MG tablet Take 1,000 mg by mouth 2 (two) times daily with a meal.   Yes Historical Provider, MD  morphine (MS CONTIN)  15 MG 12 hr tablet Take 1 tablet (15 mg total) by mouth every 12 (twelve) hours. 02/07/14  Yes Ladell Pier, MD  nitroGLYCERIN (NITROSTAT) 0.4 MG SL tablet Place 0.4 mg under the tongue every 5 (five) minutes as needed for chest pain.   Yes Historical Provider, MD  Omega-3 Fatty Acids (FISH OIL) 1200 MG CAPS Take 1 capsule by mouth 2 (two) times daily.    Yes Historical Provider, MD  oxyCODONE-acetaminophen (PERCOCET/ROXICET) 5-325 MG per tablet Take 1-2 tablets by mouth every 4 (four) hours as needed for severe pain. 01/31/14  Yes Ladell Pier, MD  prochlorperazine (COMPAZINE) 10 MG tablet Take 1 tablet (10 mg total) by mouth every 6 (six) hours as needed (nausea). 10/28/12  Yes Ladell Pier, MD  simvastatin  (ZOCOR) 80 MG tablet Take 80 mg by mouth at bedtime.   Yes Historical Provider, MD  Lancets Glory Rosebush ULTRASOFT) lancets  05/02/13   Historical Provider, MD  NOVOFINE 32G X 6 MM MISC  08/28/13   Historical Provider, MD  ONE TOUCH ULTRA TEST test strip  05/30/13   Historical Provider, MD   BP 146/98  Pulse 60  Temp(Src) 99 F (37.2 C) (Oral)  Resp 19  Ht 6\' 1"  (1.854 m)  Wt 211 lb (95.709 kg)  BMI 27.84 kg/m2  SpO2 93% Physical Exam  Nursing note and vitals reviewed. Constitutional: He is oriented to person, place, and time. He appears well-developed and well-nourished. He appears distressed.  HENT:  Head: Normocephalic and atraumatic.  Right Ear: External ear normal.  Left Ear: External ear normal.  Nose: Nose normal.  Mouth/Throat: Oropharynx is clear and moist.  Eyes: Conjunctivae and EOM are normal. Pupils are equal, round, and reactive to light.  Neck: Normal range of motion. Neck supple. No JVD present. No tracheal deviation present. No thyromegaly present.  Cardiovascular: Normal rate, regular rhythm, normal heart sounds and intact distal pulses.  Exam reveals no gallop and no friction rub.   No murmur heard. Pulmonary/Chest: Effort normal and breath sounds normal. No stridor. No respiratory distress. He has no wheezes. He has no rales. He exhibits no tenderness.  Abdominal: Soft. Bowel sounds are normal. He exhibits no distension and no mass. There is tenderness (tenderness in epigastrium right upper quadrant). There is no rebound and no guarding.  Musculoskeletal: Normal range of motion. He exhibits no edema and no tenderness.  Lymphadenopathy:    He has no cervical adenopathy.  Neurological: He is alert and oriented to person, place, and time. He exhibits normal muscle tone. Coordination normal.  Skin: Skin is warm and dry. No rash noted. No erythema. No pallor.  Psychiatric: He has a normal mood and affect. His behavior is normal. Judgment and thought content normal.     ED Course  Procedures (including critical care time) Labs Review Labs Reviewed  COMPREHENSIVE METABOLIC PANEL - Abnormal; Notable for the following:    Glucose, Bld 318 (*)    BUN 29 (*)    GFR calc non Af Amer 52 (*)    GFR calc Af Amer 60 (*)    All other components within normal limits  CBC - Abnormal; Notable for the following:    RBC 4.07 (*)    Hemoglobin 11.6 (*)    HCT 34.8 (*)    Platelets 91 (*)    All other components within normal limits  LIPASE, BLOOD    Imaging Review No results found.   EKG Interpretation None  MDM   Final diagnoses:  None    79 year old male with pancreatic cancer presents with pain after starting new chemotherapy regimen.  We'll check labs, to give Dilaudid.  Patient may need admission for pain control.  3:18 AM Patient has received 3 mg Dilaudid.   pain level is gone from 10 down to 5.  Discuss with hospitalist for admission for further pain control.  Kalman Drape, MD 02/12/14 [phone]

## 2014-02-12 NOTE — Progress Notes (Signed)
Pt. Has been discharged home. He was given his discharge instructions, prescriptions, and all questions were answered. He is to be transported home this afternoon by his wife.

## 2014-02-12 NOTE — Progress Notes (Signed)
Christian Kaiser   DOB:21-Sep-1934   PI#:[id number]   AYT#:[id number]  Subjective:Christian Kaiser is a 79 year old man with a history of pancreas cancer, s/p C1 D1 FOLFOX on 8/26 as detailed below, admitted with progressive abdominal pain, works on the left flank and groin without dysuria or hematuria, as well as nausea and vomiting. No constipation or diarrhea.He had no fever, chills. No respiratory or cardiac complaints.  PAin was better controlled with Dilaudid. He is afebrile. No neuropathy symptoms.  Oncological History  1. Adenocarcinoma of the pancreas status post ultrasound-guided biopsy of a liver lesion 10/18/2012 consistent with metastatic pancreas cancer. CT 10/11/2012 revealed a pancreas tail mass and liver metastases. CA 19-9 in normal range on 10/24/2012. Initiation of gemcitabine/Abraxane on a day 1, day 8 day 15 schedule on 10/28/2012. Current schedule was day one/day 8.  Restaging CT 01/26/2013 revealed a decrease in the size of liver metastases and no new lesions  Gemcitabine/Abraxane continued on a day 1, day 8 schedule.  Restaging CT 04/25/2013 with a decrease in the size of the pancreas mass and liver lesions.  Gemcitabine/Abraxane continued on a day 1, day 8 schedule.  Restaging CT 08/08/2013-further regression of the pancreas mass, stable liver lesions.  Gemcitabine/Abraxane continued on a day 1, day 8 schedule.  Gemcitabine/Abraxane changed to an every 2 week schedule beginning 10/11/2013.  Restaging CT abdomen/pelvis 12/26/2013 with interval enlargement of a peripherally enhancing lesion in the right hepatic lobe; stable lesion left hepatic lobe; interval mild progression of the mass involving the pancreatic tail. Cycle 1 FOLFOX 02/07/2014 2. Abdominal pain secondary to the pancreas mass. Progressive. 3. Anorexia/weight loss. He has again developed anorexia. 4. Diabetes. Exacerbated by steroid premedication. Decadron was reduced to 5 mg after cycle 1 day 1. 5. History of  coronary artery disease. 6. Headache following cycle 1 day 1 gemcitabine/Abraxane. Question related to Zofran.  7. History of thrombocytopenia secondary to chemotherapy. 8. 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 Question secondary to Abraxane versus diabetes.  9. Depression-he declined an antidepressant. Counseling services were offered as well. 10. S/P C1 D1 FOLFOX initiated on 8/26. Cycle 2 is planned for 9/9 if clinically stable  Scheduled Meds: . aspirin EC  81 mg Oral Daily  . atorvastatin  40 mg Oral q1800  . enoxaparin (LOVENOX) injection  40 mg Subcutaneous Q24H  . insulin aspart  0-9 Units Subcutaneous TID WC  . insulin detemir  25 Units Subcutaneous QHS  . isosorbide mononitrate  60 mg Oral q morning - 10a  . morphine  15 mg Oral Q12H  . omega-3 acid ethyl esters  1 g Oral BID  . pantoprazole (PROTONIX) IV  40 mg Intravenous Q24H   Continuous Infusions: . sodium chloride 75 mL/hr at 02/12/14 0456   PRN Meds:.acetaminophen, acetaminophen, bisacodyl, HYDROmorphone (DILAUDID) injection, lidocaine-prilocaine, nitroGLYCERIN, ondansetron (ZOFRAN) IV, ondansetron, prochlorperazine, zolpidem   Objective:  Filed Vitals:   02/12/14 0357  BP: 150/98  Pulse: 68  Temp: 97.6 F (36.4 C)  Resp: 18   GENERAL: 79 year old AAM alert, no distress and comfortable SKIN: skin color, texture, turgor are normal, no rashes or significant lesions EYES: normal, conjunctiva are pink and non-injected, sclera clear OROPHARYNX:no exudate, no erythema and lips, buccal mucosa, and tongue normal  NECK: supple, thyroid normal size, non-tender, without nodularity LYMPH:  no palpable lymphadenopathy in the cervical, axillary or inguinal LUNGS: clear to auscultation and percussion with normal breathing effort HEART: Irregular, 2/6 systolic murmur  ABDOMEN: Soft, no hepatomegaly, no mass. Tender in the left mid abdomen Musculoskeletal: Mild tenderness at the left lower  flank     Labs:   Recent Labs Lab 02/07/14 0942 02/12/14 0034  WBC 4.3 4.6  HGB 11.7* 11.6*  HCT 36.2* 34.8*  PLT 106* 91*  MCV 86.9 85.5  MCH 28.2 28.5  MCHC 32.4 33.3  RDW 15.2* 13.8  LYMPHSABS 0.9 0.9  MONOABS 0.4 0.2  EOSABS 0.1 0.1  BASOSABS 0.0 0.0     Chemistries:    Recent Labs Lab 02/07/14 0942 02/12/14 0034  NA 139 138  K 4.6 4.4  CL  --  100  CO2 24 23  GLUCOSE 264* 318*  BUN 17.9 29*  CREATININE 1.5* 1.29  CALCIUM 9.4 9.6  AST 22 23  ALT 18 23  ALKPHOS 60 64  BILITOT 0.45 0.4    GFR Estimated Creatinine Clearance: 54.2 ml/min (by C-G formula based on Cr of 1.29).  Liver Function Tests:  Recent Labs Lab 02/07/14 0942 02/12/14 0034  AST 22 23  ALT 18 23  ALKPHOS 60 64  BILITOT 0.45 0.4  PROT 7.1 7.2  ALBUMIN 3.8 3.9    Recent Labs Lab 02/12/14 0034  LIPASE 72      Assessment/Plan: 79 y.o.   1. Adenocarcinoma of the pancreas status post ultrasound-guided biopsy of a liver lesion 10/18/2012 consistent with metastatic pancreas cancer. Restaging CT 12/26/2013 was consistent with disease progression.  S/P C1 D1 FOLFOX initiated on 8/26. Cycle 2 is planned for 9/9 if clinically stable  2. Abdominal Pain-most likely secondary to pancreas cancer 3. Nausea and Vomiting-likely delayed nausea secondary to chemotherapy versus nausea secondary to pancreas cancer  4.Anemia 5.Thrombocytopenia secondary to  6. DVT prophylaxis On Lovenox  7. Full Code  Christian Kaiser presents with increased abdominal/back pain and new onset nausea/vomiting. The nausea and vomiting may be related to chemotherapy given last week. He has experienced increased pain over the past month. This is most likely secondary to progression of the pancreas cancer.  Recommendations:  1. Continue MS Contin, Dilaudid or Percocet for breakthrough pain 2. anti-medics as needed 3. followup CT abdomen to look for other cause of the abdominal/back pain such as a kidney  stone  We will continue following Christian Kaiser in the hospital and as an outpatient.  **Disclaimer: This note was dictated with voice recognition software. Similar sounding words can inadvertently be transcribed and this note may contain transcription errors which may not have been corrected upon publication of note.** WERTMAN,SARA E, PA-C 02/12/2014  7:48 AM

## 2014-02-13 ENCOUNTER — Other Ambulatory Visit: Payer: Self-pay | Admitting: Geriatric Medicine

## 2014-02-13 DIAGNOSIS — M545 Low back pain, unspecified: Secondary | ICD-10-CM

## 2014-02-14 ENCOUNTER — Other Ambulatory Visit: Payer: Medicare Other

## 2014-02-14 ENCOUNTER — Ambulatory Visit
Admission: RE | Admit: 2014-02-14 | Discharge: 2014-02-14 | Disposition: A | Discharge status: Home / Self Care | Payer: Medicare Other | Source: Ambulatory Visit | Attending: Geriatric Medicine | Admitting: Geriatric Medicine

## 2014-02-14 DIAGNOSIS — M545 Low back pain, unspecified: Secondary | ICD-10-CM

## 2014-02-14 MED ORDER — GADOBENATE DIMEGLUMINE 529 MG/ML IV SOLN
20.0000 mL | Freq: Once | INTRAVENOUS | Status: AC | PRN
  Administered 2014-02-14: 20 mL via INTRAVENOUS

## 2014-02-18 ENCOUNTER — Other Ambulatory Visit: Payer: Self-pay | Admitting: Oncology

## 2014-02-19 DIAGNOSIS — Z951 Presence of aortocoronary bypass graft: Secondary | ICD-10-CM | POA: Diagnosis not present

## 2014-02-19 DIAGNOSIS — Z8739 Personal history of other diseases of the musculoskeletal system and connective tissue: Secondary | ICD-10-CM | POA: Diagnosis not present

## 2014-02-19 DIAGNOSIS — K5909 Other constipation: Secondary | ICD-10-CM | POA: Diagnosis not present

## 2014-02-19 DIAGNOSIS — Z87891 Personal history of nicotine dependence: Secondary | ICD-10-CM | POA: Insufficient documentation

## 2014-02-19 DIAGNOSIS — E785 Hyperlipidemia, unspecified: Secondary | ICD-10-CM | POA: Insufficient documentation

## 2014-02-19 DIAGNOSIS — Z79899 Other long term (current) drug therapy: Secondary | ICD-10-CM | POA: Diagnosis not present

## 2014-02-19 DIAGNOSIS — Z7982 Long term (current) use of aspirin: Secondary | ICD-10-CM | POA: Diagnosis not present

## 2014-02-19 DIAGNOSIS — Z8509 Personal history of malignant neoplasm of other digestive organs: Secondary | ICD-10-CM | POA: Insufficient documentation

## 2014-02-19 DIAGNOSIS — I10 Essential (primary) hypertension: Secondary | ICD-10-CM | POA: Insufficient documentation

## 2014-02-19 DIAGNOSIS — I2510 Atherosclerotic heart disease of native coronary artery without angina pectoris: Secondary | ICD-10-CM | POA: Insufficient documentation

## 2014-02-19 DIAGNOSIS — Z8619 Personal history of other infectious and parasitic diseases: Secondary | ICD-10-CM | POA: Insufficient documentation

## 2014-02-19 DIAGNOSIS — R1084 Generalized abdominal pain: Secondary | ICD-10-CM | POA: Diagnosis present

## 2014-02-19 DIAGNOSIS — E119 Type 2 diabetes mellitus without complications: Secondary | ICD-10-CM | POA: Diagnosis not present

## 2014-02-19 DIAGNOSIS — Z87442 Personal history of urinary calculi: Secondary | ICD-10-CM | POA: Insufficient documentation

## 2014-02-20 ENCOUNTER — Telehealth: Payer: Self-pay | Admitting: *Deleted

## 2014-02-20 ENCOUNTER — Encounter (HOSPITAL_COMMUNITY): Payer: Self-pay | Admitting: Emergency Medicine

## 2014-02-20 ENCOUNTER — Emergency Department (HOSPITAL_COMMUNITY): Payer: Medicare Other

## 2014-02-20 ENCOUNTER — Emergency Department (HOSPITAL_COMMUNITY)
Admission: EM | Admit: 2014-02-20 | Discharge: 2014-02-20 | Disposition: A | Discharge status: Home / Self Care | Payer: Medicare Other | Attending: Emergency Medicine | Admitting: Emergency Medicine

## 2014-02-20 DIAGNOSIS — K5904 Chronic idiopathic constipation: Secondary | ICD-10-CM

## 2014-02-20 DIAGNOSIS — IMO0001 Reserved for inherently not codable concepts without codable children: Secondary | ICD-10-CM

## 2014-02-20 HISTORY — DX: Reserved for concepts with insufficient information to code with codable children: IMO0002

## 2014-02-20 LAB — COMPREHENSIVE METABOLIC PANEL
ALK PHOS: 64 U/L (ref 39–117)
ALT: 22 U/L (ref 0–53)
ANION GAP: 14 (ref 5–15)
AST: 22 U/L (ref 0–37)
Albumin: 3.5 g/dL (ref 3.5–5.2)
BILIRUBIN TOTAL: 0.3 mg/dL (ref 0.3–1.2)
BUN: 13 mg/dL (ref 6–23)
CHLORIDE: 100 meq/L (ref 96–112)
CO2: 24 meq/L (ref 19–32)
Calcium: 9.2 mg/dL (ref 8.4–10.5)
Creatinine, Ser: 0.97 mg/dL (ref 0.50–1.35)
GFR, EST AFRICAN AMERICAN: 90 mL/min — AB (ref >90)
GFR, EST NON AFRICAN AMERICAN: 78 mL/min — AB (ref >90)
GLUCOSE: 189 mg/dL — AB (ref 70–99)
POTASSIUM: 3.9 meq/L (ref 3.7–5.3)
Sodium: 138 mEq/L (ref 137–147)
Total Protein: 6.6 g/dL (ref 6.0–8.3)

## 2014-02-20 LAB — URINALYSIS, ROUTINE W REFLEX MICROSCOPIC
BILIRUBIN URINE: NEGATIVE
Glucose, UA: 250 mg/dL — AB
KETONES UR: NEGATIVE mg/dL
Leukocytes, UA: NEGATIVE
NITRITE: NEGATIVE
PH: 6.5 (ref 5.0–8.0)
PROTEIN: 30 mg/dL — AB
Specific Gravity, Urine: 1.015 (ref 1.005–1.030)
UROBILINOGEN UA: 1 mg/dL (ref 0.0–1.0)

## 2014-02-20 LAB — CBC WITH DIFFERENTIAL/PLATELET
Basophils Absolute: 0 10*3/uL (ref 0.0–0.1)
Basophils Relative: 0 % (ref 0–1)
Eosinophils Absolute: 0.1 10*3/uL (ref 0.0–0.7)
Eosinophils Relative: 1 % (ref 0–5)
HEMATOCRIT: 31.6 % — AB (ref 39.0–52.0)
HEMOGLOBIN: 10.7 g/dL — AB (ref 13.0–17.0)
LYMPHS ABS: 1.1 10*3/uL (ref 0.7–4.0)
Lymphocytes Relative: 22 % (ref 12–46)
MCH: 28.6 pg (ref 26.0–34.0)
MCHC: 33.9 g/dL (ref 30.0–36.0)
MCV: 84.5 fL (ref 78.0–100.0)
MONOS PCT: 6 % (ref 3–12)
Monocytes Absolute: 0.3 10*3/uL (ref 0.1–1.0)
NEUTROS ABS: 3.5 10*3/uL (ref 1.7–7.7)
NEUTROS PCT: 71 % (ref 43–77)
Platelets: 77 10*3/uL — ABNORMAL LOW (ref 150–400)
RBC: 3.74 MIL/uL — ABNORMAL LOW (ref 4.22–5.81)
RDW: 13.9 % (ref 11.5–15.5)
WBC: 4.9 10*3/uL (ref 4.0–10.5)

## 2014-02-20 LAB — URINE MICROSCOPIC-ADD ON

## 2014-02-20 LAB — LIPASE, BLOOD: Lipase: 16 U/L (ref 11–59)

## 2014-02-20 MED ORDER — KETOROLAC TROMETHAMINE 30 MG/ML IJ SOLN
30.0000 mg | Freq: Once | INTRAMUSCULAR | Status: AC
  Administered 2014-02-20: 30 mg via INTRAVENOUS
  Filled 2014-02-20: qty 1

## 2014-02-20 MED ORDER — GI COCKTAIL ~~LOC~~
30.0000 mL | Freq: Once | ORAL | Status: AC
  Administered 2014-02-20: 30 mL via ORAL
  Filled 2014-02-20: qty 30

## 2014-02-20 MED ORDER — SORBITOL 70 % PO SOLN: 15.0000 mL | Freq: Every day | ORAL | Status: DC | PRN

## 2014-02-20 MED ORDER — FENTANYL CITRATE 0.05 MG/ML IJ SOLN
150.0000 ug | Freq: Once | INTRAMUSCULAR | Status: AC
  Administered 2014-02-20: 150 ug via INTRAVENOUS
  Filled 2014-02-20: qty 4

## 2014-02-20 MED ORDER — DICYCLOMINE HCL 20 MG PO TABS: 20.0000 mg | ORAL_TABLET | Freq: Two times a day (BID) | ORAL | Status: DC

## 2014-02-20 MED ORDER — DICYCLOMINE HCL 10 MG/ML IM SOLN
20.0000 mg | Freq: Once | INTRAMUSCULAR | Status: AC
  Administered 2014-02-20: 20 mg via INTRAMUSCULAR
  Filled 2014-02-20: qty 2

## 2014-02-20 MED ORDER — HYDROMORPHONE HCL PF 2 MG/ML IJ SOLN
2.0000 mg | Freq: Once | INTRAMUSCULAR | Status: AC
  Administered 2014-02-20: 2 mg via INTRAVENOUS
  Filled 2014-02-20: qty 1

## 2014-02-20 MED ORDER — SENNOSIDES-DOCUSATE SODIUM 8.6-50 MG PO TABS: 1.0000 | ORAL_TABLET | Freq: Two times a day (BID) | ORAL | Status: AC

## 2014-02-20 MED ORDER — SODIUM CHLORIDE 0.9 % IV SOLN
1000.0000 mL | Freq: Once | INTRAVENOUS | Status: AC
  Administered 2014-02-20: 1000 mL via INTRAVENOUS

## 2014-02-20 MED ORDER — HEPARIN SOD (PORK) LOCK FLUSH 100 UNIT/ML IV SOLN
500.0000 [IU] | Freq: Once | INTRAVENOUS | Status: AC
  Administered 2014-02-20: 500 [IU]
  Filled 2014-02-20: qty 5

## 2014-02-20 MED ORDER — ONDANSETRON HCL 4 MG/2ML IJ SOLN
4.0000 mg | Freq: Once | INTRAMUSCULAR | Status: AC
  Administered 2014-02-20: 4 mg via INTRAVENOUS
  Filled 2014-02-20: qty 2

## 2014-02-20 MED ORDER — HYDROMORPHONE HCL PF 1 MG/ML IJ SOLN
1.0000 mg | Freq: Once | INTRAMUSCULAR | Status: AC
  Administered 2014-02-20: 1 mg via INTRAVENOUS
  Filled 2014-02-20: qty 1

## 2014-02-20 NOTE — Discharge Instructions (Signed)

## 2014-02-20 NOTE — Telephone Encounter (Signed)
Call from pt's wife reporting constipation. Last BM 5 days ago. Was seen in ED 9/7 for abdominal pain related to constipation. Asks if it is safe to do an enema? YES. Next visit 02/20/14. Reviewed with Lattie Haw, NP: Order received to add Sorbitol 20ml PRN as a back up plan to recently added Senokot and twice daily Miralax. Called pt informing him of new PRN Sorbitol Rx.

## 2014-02-20 NOTE — ED Provider Notes (Signed)
CSN: [id number]     Arrival date & time 02/19/14  2344 History   First MD Initiated Contact with Patient 02/20/14 0102     Chief Complaint  Patient presents with  . Abdominal Pain     (Consider location/radiation/quality/duration/timing/severity/associated sxs/prior Treatment) Patient is a 79 y.o. male presenting with abdominal pain. The history is provided by the patient.  Abdominal Pain Pain location:  Generalized Pain quality: sharp   Pain radiates to:  Does not radiate Pain severity:  Severe Onset quality:  Gradual Duration:  3 days Timing:  Constant Progression:  Unchanged Chronicity:  Recurrent Context: not trauma   Relieved by:  Nothing Worsened by:  Nothing tried Ineffective treatments:  None tried Associated symptoms: nausea   Risk factors: no alcohol abuse     Past Medical History  Diagnosis Date  . Diabetes mellitus     type 2  . Kidney stones   . HTN (hypertension)   . HLD (hyperlipidemia)   . CAD (coronary artery disease)     s/p 4v CABG 1990s and redo CABG 2006; cath 2007 showed patent grafts  . Carotid disease, bilateral     06/2010 carotid doppler <40% RICA stenosis and 56-31% LICA stenosis  . Gastric ulcer 2003  . Diverticulitis   . Anal fissure     s/p repair  . Shingles   . Cervical spondylosis   . Sacroiliitis   . Pancreas cancer 10/2012  . Bulging disc    Past Surgical History  Procedure Laterality Date  . Coronary artery bypass graft      1990s (SVG OM2/OM3, SVG to RCA, LIMA to LAD) and redo 2006 (reverse SVT to OM & distal LCx, reverse SVG to PDA, preservation of LIMA)  . Rotator cuff repair      Left  . Knee arthroscopy      Right   Family History  Problem Relation Age of Onset  . Cancer Father    History  Substance Use Topics  . Smoking status: Former Smoker    Quit date: 10/19/2002  . Smokeless tobacco: Not on file  . Alcohol Use: No    Review of Systems  Gastrointestinal: Positive for nausea and abdominal pain.  All  other systems reviewed and are negative.     Allergies  Altace  Home Medications   Prior to Admission medications   Medication Sig Start Date End Date Taking? Authorizing Provider  Artificial Tear Solution (JUST TEARS EYE DROPS OP) Apply 1 drop to eye at bedtime.    Historical Provider, MD  aspirin EC 81 MG tablet Take 81 mg by mouth daily.    Historical Provider, MD  bisacodyl (DULCOLAX) 5 MG EC tablet Take 5 mg by mouth daily as needed for mild constipation or moderate constipation.    Historical Provider, MD  diphenhydramine-acetaminophen (TYLENOL PM) 25-500 MG TABS Take 2 tablets by mouth at bedtime as needed (sleep).    Historical Provider, MD  glimepiride (AMARYL) 4 MG tablet Take 4 mg by mouth 2 (two) times daily.    Historical Provider, MD  insulin detemir (LEVEMIR) 100 UNIT/ML injection Inject 40 Units into the skin at bedtime.     Historical Provider, MD  isosorbide mononitrate (IMDUR) 60 MG 24 hr tablet Take 1 tablet (60 mg total) by mouth every morning. 07/03/13   Sinclair Grooms, MD  Lancets Glory Rosebush ULTRASOFT) lancets  05/02/13   Historical Provider, MD  lidocaine-prilocaine (EMLA) cream Apply topically as needed. apply over port 1-2 hours prior  to chemotherapy. 05/17/13   Owens Shark, NP  metFORMIN (GLUCOPHAGE) 1000 MG tablet Take 1,000 mg by mouth 2 (two) times daily with a meal.    Historical Provider, MD  morphine (MS CONTIN) 15 MG 12 hr tablet Take 1 tablet (15 mg total) by mouth every 12 (twelve) hours. 02/07/14   Ladell Pier, MD  nitroGLYCERIN (NITROSTAT) 0.4 MG SL tablet Place 0.4 mg under the tongue every 5 (five) minutes as needed for chest pain.    Historical Provider, MD  NOVOFINE 32G X 6 MM MISC  08/28/13   Historical Provider, MD  Omega-3 Fatty Acids (FISH OIL) 1200 MG CAPS Take 1 capsule by mouth 2 (two) times daily.     Historical Provider, MD  ONE TOUCH ULTRA TEST test strip  05/30/13   Historical Provider, MD  oxyCODONE-acetaminophen (PERCOCET/ROXICET)  5-325 MG per tablet Take 1-2 tablets by mouth every 4 (four) hours as needed for severe pain. 01/31/14   Ladell Pier, MD  prochlorperazine (COMPAZINE) 10 MG tablet Take 1 tablet (10 mg total) by mouth every 6 (six) hours as needed (nausea). 10/28/12   Ladell Pier, MD  simvastatin (ZOCOR) 80 MG tablet Take 80 mg by mouth at bedtime.    Historical Provider, MD  sorbitol 70 % SOLN Take 30 mLs by mouth daily as needed for moderate constipation. 02/12/14   Donne Hazel, MD   BP 196/107  Pulse 80  Temp(Src) 98.7 F (37.1 C) (Oral)  Resp 18  SpO2 100% Physical Exam  Constitutional: He is oriented to person, place, and time. He appears well-developed and well-nourished. No distress.  HENT:  Head: Normocephalic and atraumatic.  Mouth/Throat: Oropharynx is clear and moist.  Eyes: Conjunctivae are normal. Pupils are equal, round, and reactive to light.  Neck: Normal range of motion. Neck supple.  Cardiovascular: Normal rate, regular rhythm and intact distal pulses.   Pulmonary/Chest: Effort normal and breath sounds normal. He has no wheezes. He has no rales.  Abdominal: Soft. Bowel sounds are increased. There is no rebound and no guarding.  Musculoskeletal: Normal range of motion.  Neurological: He is alert and oriented to person, place, and time.  Skin: Skin is warm and dry.  Psychiatric: He has a normal mood and affect.    ED Course  Procedures (including critical care time) Labs Review Labs Reviewed  CBC WITH DIFFERENTIAL  COMPREHENSIVE METABOLIC PANEL  URINALYSIS, ROUTINE W REFLEX MICROSCOPIC  LIPASE, BLOOD    Imaging Review No results found.   EKG Interpretation None      MDM   Final diagnoses:  None   Medications  ondansetron (ZOFRAN) injection 4 mg (4 mg Intravenous Given 02/20/14 0113)  0.9 %  sodium chloride infusion (0 mLs Intravenous Stopped 02/20/14 0351)  fentaNYL (SUBLIMAZE) injection 150 mcg (150 mcg Intravenous Given 02/20/14 0132)  dicyclomine (BENTYL)  injection 20 mg (20 mg Intramuscular Given 02/20/14 0131)  ketorolac (TORADOL) 30 MG/ML injection 30 mg (30 mg Intravenous Given 02/20/14 0321)  gi cocktail (Maalox,Lidocaine,Donnatal) (30 mLs Oral Given 02/20/14 0321)  HYDROmorphone (DILAUDID) injection 2 mg (2 mg Intravenous Given 02/20/14 0320)  HYDROmorphone (DILAUDID) injection 1 mg (1 mg Intravenous Given 02/20/14 0358)    Markedly improved post medication.  Pain is improved and gas is markedly improved.  Will increase miralax to BID and add sennokot.  Will prescribe bentyl.  Patient to follow up with PMD and oncology strict return precautions given    Desi Carby Alfonso Patten, MD 02/20/14 [phone]

## 2014-02-20 NOTE — ED Notes (Signed)
Pt states he is having abd pain that started last week  Pt states his stomach is sore and is very painful  Pt was seen here one day last week for same  Pt states has pancreatic cancer  Pt is receiving chemo, last treatment was August 26th

## 2014-02-20 NOTE — ED Notes (Signed)
Urine collected.

## 2014-02-21 ENCOUNTER — Other Ambulatory Visit (HOSPITAL_BASED_OUTPATIENT_CLINIC_OR_DEPARTMENT_OTHER): Payer: Medicare Other

## 2014-02-21 ENCOUNTER — Ambulatory Visit (HOSPITAL_BASED_OUTPATIENT_CLINIC_OR_DEPARTMENT_OTHER): Payer: Medicare Other

## 2014-02-21 ENCOUNTER — Telehealth: Payer: Self-pay | Admitting: *Deleted

## 2014-02-21 ENCOUNTER — Ambulatory Visit (HOSPITAL_BASED_OUTPATIENT_CLINIC_OR_DEPARTMENT_OTHER): Payer: Medicare Other | Admitting: Nurse Practitioner

## 2014-02-21 ENCOUNTER — Inpatient Hospital Stay: Payer: Medicare Other

## 2014-02-21 ENCOUNTER — Telehealth: Payer: Self-pay | Admitting: Oncology

## 2014-02-21 VITALS — BP 173/89 | HR 69 | Temp 98.4°F | Resp 19 | Ht 73.0 in | Wt 205.0 lb

## 2014-02-21 DIAGNOSIS — D696 Thrombocytopenia, unspecified: Secondary | ICD-10-CM

## 2014-02-21 DIAGNOSIS — C259 Malignant neoplasm of pancreas, unspecified: Secondary | ICD-10-CM

## 2014-02-21 DIAGNOSIS — C252 Malignant neoplasm of tail of pancreas: Secondary | ICD-10-CM

## 2014-02-21 DIAGNOSIS — Z23 Encounter for immunization: Secondary | ICD-10-CM

## 2014-02-21 DIAGNOSIS — Z95828 Presence of other vascular implants and grafts: Secondary | ICD-10-CM

## 2014-02-21 DIAGNOSIS — R52 Pain, unspecified: Secondary | ICD-10-CM

## 2014-02-21 DIAGNOSIS — C787 Secondary malignant neoplasm of liver and intrahepatic bile duct: Secondary | ICD-10-CM

## 2014-02-21 LAB — CBC WITH DIFFERENTIAL/PLATELET
BASO%: 0.6 % (ref 0.0–2.0)
BASOS ABS: 0 10*3/uL (ref 0.0–0.1)
EOS%: 1.3 % (ref 0.0–7.0)
Eosinophils Absolute: 0.1 10*3/uL (ref 0.0–0.5)
HCT: 34.1 % — ABNORMAL LOW (ref 38.4–49.9)
HEMOGLOBIN: 11 g/dL — AB (ref 13.0–17.1)
LYMPH%: 16.4 % (ref 14.0–49.0)
MCH: 27.9 pg (ref 27.2–33.4)
MCHC: 32.2 g/dL (ref 32.0–36.0)
MCV: 86.7 fL (ref 79.3–98.0)
MONO#: 0.3 10*3/uL (ref 0.1–0.9)
MONO%: 7.3 % (ref 0.0–14.0)
NEUT%: 74.4 % (ref 39.0–75.0)
NEUTROS ABS: 3.6 10*3/uL (ref 1.5–6.5)
PLATELETS: 80 10*3/uL — AB (ref 140–400)
RBC: 3.93 10*6/uL — AB (ref 4.20–5.82)
RDW: 15.2 % — AB (ref 11.0–14.6)
WBC: 4.8 10*3/uL (ref 4.0–10.3)
lymph#: 0.8 10*3/uL — ABNORMAL LOW (ref 0.9–3.3)

## 2014-02-21 LAB — COMPREHENSIVE METABOLIC PANEL (CC13)
ALK PHOS: 60 U/L (ref 40–150)
ALT: 20 U/L (ref 0–55)
AST: 23 U/L (ref 5–34)
Albumin: 3.6 g/dL (ref 3.5–5.0)
Anion Gap: 10 mEq/L (ref 3–11)
BUN: 13.8 mg/dL (ref 7.0–26.0)
CO2: 23 mEq/L (ref 22–29)
CREATININE: 1.3 mg/dL (ref 0.7–1.3)
Calcium: 9.2 mg/dL (ref 8.4–10.4)
Chloride: 106 mEq/L (ref 98–109)
Glucose: 268 mg/dl — ABNORMAL HIGH (ref 70–140)
POTASSIUM: 4 meq/L (ref 3.5–5.1)
Sodium: 139 mEq/L (ref 136–145)
Total Bilirubin: 0.57 mg/dL (ref 0.20–1.20)
Total Protein: 6.8 g/dL (ref 6.4–8.3)

## 2014-02-21 MED ORDER — MORPHINE SULFATE ER 30 MG PO TBCR: 30.0000 mg | EXTENDED_RELEASE_TABLET | Freq: Two times a day (BID) | ORAL | Status: DC

## 2014-02-21 MED ORDER — HYDROMORPHONE HCL 2 MG PO TABS: 2.0000 mg | ORAL_TABLET | ORAL | Status: DC | PRN

## 2014-02-21 MED ORDER — INFLUENZA VAC SPLIT QUAD 0.5 ML IM SUSY
0.5000 mL | PREFILLED_SYRINGE | Freq: Once | INTRAMUSCULAR | Status: AC
  Administered 2014-02-21: 0.5 mL via INTRAMUSCULAR
  Filled 2014-02-21: qty 0.5

## 2014-02-21 MED ORDER — SODIUM CHLORIDE 0.9 % IJ SOLN
10.0000 mL | INTRAMUSCULAR | Status: DC | PRN
  Administered 2014-02-21: 10 mL via INTRAVENOUS
  Filled 2014-02-21: qty 10

## 2014-02-21 MED ORDER — HEPARIN SOD (PORK) LOCK FLUSH 100 UNIT/ML IV SOLN
500.0000 [IU] | Freq: Once | INTRAVENOUS | Status: AC
  Administered 2014-02-21: 500 [IU] via INTRAVENOUS
  Filled 2014-02-21: qty 5

## 2014-02-21 NOTE — Telephone Encounter (Signed)
Per staff message and POF I have scheduled appts. Advised scheduler of appts. JMW  

## 2014-02-21 NOTE — Telephone Encounter (Signed)
gv adn printed appt sched and avs fo rpt for Sept...sed added tx....emailed MW to add tx. 9.16

## 2014-02-21 NOTE — Patient Instructions (Signed)

## 2014-02-21 NOTE — Progress Notes (Addendum)
[facility] OFFICE PROGRESS NOTE   Diagnosis:  Pancreas cancer  INTERVAL HISTORY:   Mr. Wieman returns as scheduled. He completed cycle 1 FOLFOX 02/07/2014. He had nausea beginning on the day of pump discontinuation. The nausea lasted for "several days". No mouth sores. No diarrhea. He has been constipated. He tried sorbitol yesterday with good results. The cold sensitivity lasted for several days. No persistent neuropathy symptoms. He continues to have left-sided abdominal pain. Current regimen consisting of MS Contin 15 mg every 12 hours and Percocet as needed has not been effective. He denies back pain. No leg weakness or numbness.  He was seen in the emergency Department on 02/12/2014 and 02/20/2014 with abdominal pain. CT abdomen/pelvis on 02/12/2014 showed the pancreas mass was not significantly changed. A liver lesion appeared slightly larger.  Objective:  Vital signs in last 24 hours:  Blood pressure 173/89, pulse 69, temperature 98.4 F (36.9 C), temperature source Oral, resp. rate 19, height 6\' 1"  (1.854 m), weight 205 lb (92.987 kg), SpO2 99.00%.    HEENT: No thrush or ulcers. Resp: Lungs clear bilaterally. Cardio: Regular rate and rhythm. GI: Abdomen soft with mild tenderness at the left abdomen. No mass. No organomegaly. Vascular: No leg edema.  Port-A-Cath site without erythema.   Lab Results:  Lab Results  Component Value Date   WBC 4.8 02/21/2014   HGB 11.0* 02/21/2014   HCT 34.1* 02/21/2014   MCV 86.7 02/21/2014   PLT 80* 02/21/2014   NEUTROABS 3.6 02/21/2014    Imaging:  Dg Abd Acute W/chest  02/20/2014   CLINICAL DATA:  Increasing mid abdominal pain for 3 days. Nausea. Cancer patient undergoing chemotherapy.  EXAM: ACUTE ABDOMEN SERIES (ABDOMEN 2 VIEW & CHEST 1 VIEW)  COMPARISON:  05/02/2012  FINDINGS: Postoperative changes in the mediastinum. Power port type central venous catheter with tip localizing over the distal SVC. Borderline heart size with  normal pulmonary vascularity. Probable emphysematous changes and fibrosis in the lungs. No blunting of costophrenic angles. No pneumothorax. No focal consolidation or airspace disease. Tortuous aorta.  Scattered gas and stool throughout the colon. No small or large bowel distention. No free intra-abdominal air. No abnormal air-fluid levels. Degenerative changes in the spine and hips.  IMPRESSION: Emphysematous changes in the lungs. No evidence of active pulmonary disease. Nonobstructive bowel gas pattern.   Electronically Signed   By: Lucienne Capers M.D.   On: 02/20/2014 02:13    Medications: I have reviewed the patient's current medications.  Assessment/Plan: 1. Adenocarcinoma of the pancreas status post ultrasound-guided biopsy of a liver lesion 10/18/2012 consistent with metastatic pancreas cancer. CT 10/11/2012 revealed a pancreas tail mass and liver metastases. CA 19-9 in normal range on 10/24/2012. Initiation of gemcitabine/Abraxane on a day 1, day 8 day 15 schedule on 10/28/2012. Current schedule is day one/day 8.  Restaging CT 01/26/2013 revealed a decrease in the size of liver metastases and no new lesions  Gemcitabine/Abraxane continued on a day 1, day 8 schedule.  Restaging CT 04/25/2013 with a decrease in the size of the pancreas mass and liver lesions.  Gemcitabine/Abraxane continued on a day 1, day 8 schedule.  Restaging CT 08/08/2013-further regression of the pancreas mass, stable liver lesions.  Gemcitabine/Abraxane continued on a day 1, day 8 schedule.  Gemcitabine/Abraxane changed to an every 2 week schedule beginning 10/11/2013.  Restaging CT abdomen/pelvis 12/26/2013 with interval enlargement of a peripherally enhancing lesion in the right hepatic lobe; stable lesion left hepatic lobe; interval mild progression of the  mass involving the pancreatic tail. Cycle 1 FOLFOX 02/07/2014. 2. Abdominal pain secondary to the pancreas mass. Progressive. 3. Anorexia/weight loss. He has  again developed anorexia. 4. Diabetes. Exacerbated by steroid premedication. Decadron was reduced to 5 mg after cycle 1 day 1. 5. History of coronary artery disease. 6. Headache following cycle 1 day 1 gemcitabine/Abraxane. Question related to Zofran.  7. History of thrombocytopenia secondary to chemotherapy. 8. 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 Question secondary to Abraxane versus diabetes.  9. Depression-he declined an antidepressant. Counseling services were offered as well. 10. Thrombocytopenia secondary to chemotherapy. 11. MRI lumbar spine 02/14/2014 with disc protrusion L5-S1 with foraminal encroachment bilaterally right greater than left. He denies back pain at present.   Disposition: Mr. Spoelstra has completed 1 cycle of FOLFOX.  He has thrombocytopenia on labs today. Treatment will be held and rescheduled for one week.  His pain is poorly controlled. He understands the pain is most likely secondary to pancreas cancer. He will increase MS Contin to 30 mg every 12 hours and begin Dilaudid 2-4 mg every 4 hours as needed for breakthrough pain. He knows to contact the office if this is not effective.  We will see him in followup prior to cycle 3 in 3 weeks. He will contact the office in the interim as outlined above or with any other problems.  Patient seen with Dr. Benay Spice.    Ned Card ANP/GNP-BC   02/21/2014  9:42 AM  This was a shared visit with Ned Card.  We adjusted the narcotic pain regimen. Cycle 2 FOLFOX will be delayed secondary to thrombocytopenia. I will dose reduce the oxaliplatin with the next cycle of chemotherapy.  [name], M.D.

## 2014-02-28 ENCOUNTER — Ambulatory Visit (HOSPITAL_BASED_OUTPATIENT_CLINIC_OR_DEPARTMENT_OTHER): Payer: Medicare Other

## 2014-02-28 ENCOUNTER — Other Ambulatory Visit (HOSPITAL_BASED_OUTPATIENT_CLINIC_OR_DEPARTMENT_OTHER): Payer: Medicare Other

## 2014-02-28 ENCOUNTER — Ambulatory Visit (HOSPITAL_BASED_OUTPATIENT_CLINIC_OR_DEPARTMENT_OTHER): Payer: Medicare Other | Admitting: Nurse Practitioner

## 2014-02-28 ENCOUNTER — Telehealth: Payer: Self-pay | Admitting: Oncology

## 2014-02-28 ENCOUNTER — Telehealth: Payer: Self-pay

## 2014-02-28 ENCOUNTER — Ambulatory Visit: Payer: Medicare Other | Admitting: Nutrition

## 2014-02-28 VITALS — BP 139/74 | HR 56 | Temp 97.5°F | Resp 20

## 2014-02-28 DIAGNOSIS — R112 Nausea with vomiting, unspecified: Secondary | ICD-10-CM

## 2014-02-28 DIAGNOSIS — C259 Malignant neoplasm of pancreas, unspecified: Secondary | ICD-10-CM

## 2014-02-28 DIAGNOSIS — C252 Malignant neoplasm of tail of pancreas: Secondary | ICD-10-CM

## 2014-02-28 DIAGNOSIS — E1165 Type 2 diabetes mellitus with hyperglycemia: Secondary | ICD-10-CM

## 2014-02-28 DIAGNOSIS — R634 Abnormal weight loss: Secondary | ICD-10-CM

## 2014-02-28 DIAGNOSIS — N289 Disorder of kidney and ureter, unspecified: Secondary | ICD-10-CM

## 2014-02-28 DIAGNOSIS — E860 Dehydration: Secondary | ICD-10-CM

## 2014-02-28 DIAGNOSIS — C787 Secondary malignant neoplasm of liver and intrahepatic bile duct: Secondary | ICD-10-CM

## 2014-02-28 DIAGNOSIS — Z5111 Encounter for antineoplastic chemotherapy: Secondary | ICD-10-CM

## 2014-02-28 DIAGNOSIS — D696 Thrombocytopenia, unspecified: Secondary | ICD-10-CM

## 2014-02-28 DIAGNOSIS — E1039 Type 1 diabetes mellitus with other diabetic ophthalmic complication: Secondary | ICD-10-CM

## 2014-02-28 DIAGNOSIS — R630 Anorexia: Secondary | ICD-10-CM

## 2014-02-28 DIAGNOSIS — R197 Diarrhea, unspecified: Secondary | ICD-10-CM

## 2014-02-28 DIAGNOSIS — G893 Neoplasm related pain (acute) (chronic): Secondary | ICD-10-CM

## 2014-02-28 DIAGNOSIS — IMO0002 Reserved for concepts with insufficient information to code with codable children: Secondary | ICD-10-CM

## 2014-02-28 DIAGNOSIS — Z95828 Presence of other vascular implants and grafts: Secondary | ICD-10-CM

## 2014-02-28 DIAGNOSIS — Z452 Encounter for adjustment and management of vascular access device: Secondary | ICD-10-CM

## 2014-02-28 LAB — CBC WITH DIFFERENTIAL/PLATELET
BASO%: 0.6 % (ref 0.0–2.0)
BASOS ABS: 0 10*3/uL (ref 0.0–0.1)
EOS ABS: 0.1 10*3/uL (ref 0.0–0.5)
EOS%: 1.6 % (ref 0.0–7.0)
HEMATOCRIT: 33 % — AB (ref 38.4–49.9)
HEMOGLOBIN: 10.7 g/dL — AB (ref 13.0–17.1)
LYMPH%: 16.8 % (ref 14.0–49.0)
MCH: 27.9 pg (ref 27.2–33.4)
MCHC: 32.4 g/dL (ref 32.0–36.0)
MCV: 86.3 fL (ref 79.3–98.0)
MONO#: 0.4 10*3/uL (ref 0.1–0.9)
MONO%: 9.5 % (ref 0.0–14.0)
NEUT#: 2.9 10*3/uL (ref 1.5–6.5)
NEUT%: 71.5 % (ref 39.0–75.0)
PLATELETS: 106 10*3/uL — AB (ref 140–400)
RBC: 3.82 10*6/uL — AB (ref 4.20–5.82)
RDW: 14.4 % (ref 11.0–14.6)
WBC: 4.1 10*3/uL (ref 4.0–10.3)
lymph#: 0.7 10*3/uL — ABNORMAL LOW (ref 0.9–3.3)

## 2014-02-28 LAB — COMPREHENSIVE METABOLIC PANEL (CC13)
ALT: 19 U/L (ref 0–55)
ANION GAP: 10 meq/L (ref 3–11)
AST: 20 U/L (ref 5–34)
Albumin: 3.4 g/dL — ABNORMAL LOW (ref 3.5–5.0)
Alkaline Phosphatase: 67 U/L (ref 40–150)
BUN: 13.3 mg/dL (ref 7.0–26.0)
CO2: 24 meq/L (ref 22–29)
Calcium: 9 mg/dL (ref 8.4–10.4)
Chloride: 103 mEq/L (ref 98–109)
Creatinine: 1.4 mg/dL — ABNORMAL HIGH (ref 0.7–1.3)
GLUCOSE: 340 mg/dL — AB (ref 70–140)
Potassium: 4.2 mEq/L (ref 3.5–5.1)
SODIUM: 138 meq/L (ref 136–145)
TOTAL PROTEIN: 6.5 g/dL (ref 6.4–8.3)
Total Bilirubin: 0.38 mg/dL (ref 0.20–1.20)

## 2014-02-28 MED ORDER — OXALIPLATIN CHEMO INJECTION 100 MG/20ML
65.0000 mg/m2 | Freq: Once | INTRAVENOUS | Status: AC
  Administered 2014-02-28: 145 mg via INTRAVENOUS
  Filled 2014-02-28: qty 29

## 2014-02-28 MED ORDER — LORAZEPAM 0.5 MG PO TABS: 0.5000 mg | ORAL_TABLET | Freq: Three times a day (TID) | ORAL | Status: AC | PRN

## 2014-02-28 MED ORDER — DEXTROSE 5 % IV SOLN
Freq: Once | INTRAVENOUS | Status: AC
  Administered 2014-02-28: 11:00:00 via INTRAVENOUS

## 2014-02-28 MED ORDER — LEUCOVORIN CALCIUM INJECTION 350 MG
400.0000 mg/m2 | Freq: Once | INTRAVENOUS | Status: AC
  Administered 2014-02-28: 888 mg via INTRAVENOUS
  Filled 2014-02-28: qty 44.4

## 2014-02-28 MED ORDER — PALONOSETRON HCL INJECTION 0.25 MG/5ML
0.2500 mg | Freq: Once | INTRAVENOUS | Status: AC
  Administered 2014-02-28: 0.25 mg via INTRAVENOUS

## 2014-02-28 MED ORDER — SODIUM CHLORIDE 0.9 % IJ SOLN
10.0000 mL | INTRAMUSCULAR | Status: DC | PRN
  Administered 2014-02-28: 10 mL via INTRAVENOUS
  Filled 2014-02-28: qty 10

## 2014-02-28 MED ORDER — SODIUM CHLORIDE 0.9 % IV SOLN
150.0000 mg | Freq: Once | INTRAVENOUS | Status: AC
  Administered 2014-02-28: 150 mg via INTRAVENOUS
  Filled 2014-02-28: qty 5

## 2014-02-28 MED ORDER — SODIUM CHLORIDE 0.9 % IV SOLN
2400.0000 mg/m2 | INTRAVENOUS | Status: DC
  Administered 2014-02-28: 5350 mg via INTRAVENOUS
  Filled 2014-02-28: qty 107

## 2014-02-28 MED ORDER — DEXAMETHASONE SODIUM PHOSPHATE 20 MG/5ML IJ SOLN
12.0000 mg | Freq: Once | INTRAMUSCULAR | Status: AC
  Administered 2014-02-28: 12 mg via INTRAVENOUS

## 2014-02-28 MED ORDER — DEXAMETHASONE SODIUM PHOSPHATE 20 MG/5ML IJ SOLN
INTRAMUSCULAR | Status: AC
  Filled 2014-02-28: qty 5

## 2014-02-28 MED ORDER — ATROPINE SULFATE 1 MG/ML IJ SOLN: 0.5000 mg | Freq: Once | INTRAMUSCULAR | Status: DC | PRN

## 2014-02-28 MED ORDER — PALONOSETRON HCL INJECTION 0.25 MG/5ML
INTRAVENOUS | Status: AC
  Filled 2014-02-28: qty 5

## 2014-02-28 NOTE — Progress Notes (Signed)
Discharged at 1445 ambulatory in no distress.

## 2014-02-28 NOTE — Telephone Encounter (Signed)
gv and printed appt sched and avs for pt for Sept and OCT....sed added tx °

## 2014-02-28 NOTE — Telephone Encounter (Signed)
LVM for Dr Carlyle Lipa nurse that cyndy bacon NP saw pt today. He is waking up with low BS in 40-60 range, symptomatic. Jenny Reichmann told pt to take 20 levimir SQ tonight (half his 40 unit dose ordered). Asked that Dr Felipa Eth check with pt tomorrow.

## 2014-02-28 NOTE — Patient Instructions (Signed)

## 2014-02-28 NOTE — Progress Notes (Signed)
Patient requested to speak with me during infusion.  He has been having increased nausea.  Also complains of very low blood sugars (40-60)  first thing in the morning with elevated blood sugars later on in the day (340 on 9/16).  Patient states he has been unable to eat.  Weight has decreased and was documented as 205 pounds September 9.  Decreased from 216.6 pounds in April 29.  Patient also complains of increased pain.  Nutrition diagnosis: Unintended weight loss related to nausea as evidenced by 11.6 pound weight loss in the past 5 months.  Intervention: Educated patient on strategies for improving nausea.  Recommended patient continue nausea medications and communicate with physician if these are not effective. Recommended small, bland meals with increased oral nutrition supplements to promote weight maintenance. Questions answered.  Teach back method used.  Monitoring, evaluation, goals: Patient will tolerate increased oral intake to minimize further weight loss.  Next visit: Patient will contact me by phone for followup as needed.   **Disclaimer: This note was dictated with voice recognition software. Similar sounding words can inadvertently be transcribed and this note may contain transcription errors which may not have been corrected upon publication of note.**

## 2014-02-28 NOTE — Patient Instructions (Signed)
[facility] Discharge Instructions for Patients Receiving Chemotherapy  Today you received the following chemotherapy agents:  Oxaliplatin, Leucovorin and 5FU  To help prevent nausea and vomiting after your treatment, we encourage you to take your nausea medication as ordered per MD.   If you develop nausea and vomiting that is not controlled by your nausea medication, call the clinic.   BELOW ARE SYMPTOMS THAT SHOULD BE REPORTED IMMEDIATELY:  *FEVER GREATER THAN 100.5 F  *CHILLS WITH OR WITHOUT FEVER  NAUSEA AND VOMITING THAT IS NOT CONTROLLED WITH YOUR NAUSEA MEDICATION  *UNUSUAL SHORTNESS OF BREATH  *UNUSUAL BRUISING OR BLEEDING  TENDERNESS IN MOUTH AND THROAT WITH OR WITHOUT PRESENCE OF ULCERS  *URINARY PROBLEMS  *BOWEL PROBLEMS  UNUSUAL RASH Items with * indicate a potential emergency and should be followed up as soon as possible.  Feel free to call the clinic you have any questions or concerns. The clinic phone number is [phone].    

## 2014-02-28 NOTE — Progress Notes (Signed)
1100-Pt having continued issues with pain, nausea and constipation.  Michel Harrow NP notified and will see patient prior to discharge today.

## 2014-03-02 ENCOUNTER — Ambulatory Visit (HOSPITAL_BASED_OUTPATIENT_CLINIC_OR_DEPARTMENT_OTHER): Payer: Medicare Other

## 2014-03-02 ENCOUNTER — Ambulatory Visit (HOSPITAL_BASED_OUTPATIENT_CLINIC_OR_DEPARTMENT_OTHER): Payer: Medicare Other | Admitting: Nurse Practitioner

## 2014-03-02 ENCOUNTER — Other Ambulatory Visit: Payer: Self-pay | Admitting: *Deleted

## 2014-03-02 VITALS — BP 135/81 | HR 73 | Temp 97.9°F

## 2014-03-02 DIAGNOSIS — IMO0002 Reserved for concepts with insufficient information to code with codable children: Secondary | ICD-10-CM

## 2014-03-02 DIAGNOSIS — E860 Dehydration: Secondary | ICD-10-CM

## 2014-03-02 DIAGNOSIS — E119 Type 2 diabetes mellitus without complications: Secondary | ICD-10-CM

## 2014-03-02 DIAGNOSIS — C787 Secondary malignant neoplasm of liver and intrahepatic bile duct: Secondary | ICD-10-CM

## 2014-03-02 DIAGNOSIS — E1165 Type 2 diabetes mellitus with hyperglycemia: Secondary | ICD-10-CM

## 2014-03-02 DIAGNOSIS — C252 Malignant neoplasm of tail of pancreas: Secondary | ICD-10-CM

## 2014-03-02 DIAGNOSIS — C782 Secondary malignant neoplasm of pleura: Secondary | ICD-10-CM

## 2014-03-02 DIAGNOSIS — C259 Malignant neoplasm of pancreas, unspecified: Secondary | ICD-10-CM

## 2014-03-02 MED ORDER — SODIUM CHLORIDE 0.9 % IV SOLN: 1000.0000 mL | INTRAVENOUS | Status: AC

## 2014-03-02 MED ORDER — OXYCODONE-ACETAMINOPHEN 5-325 MG PO TABS
2.0000 | ORAL_TABLET | Freq: Once | ORAL | Status: AC
Start: 2014-03-02 — End: 2014-03-02
  Administered 2014-03-02: 2 via ORAL

## 2014-03-02 MED ORDER — OXYCODONE-ACETAMINOPHEN 5-325 MG PO TABS
ORAL_TABLET | ORAL | Status: AC
  Filled 2014-03-02: qty 2

## 2014-03-02 MED ORDER — SODIUM CHLORIDE 0.9 % IV SOLN
Freq: Once | INTRAVENOUS | Status: AC
  Administered 2014-03-02: 13:00:00 via INTRAVENOUS

## 2014-03-02 MED ORDER — HEPARIN SOD (PORK) LOCK FLUSH 100 UNIT/ML IV SOLN
500.0000 [IU] | Freq: Once | INTRAVENOUS | Status: AC | PRN
  Administered 2014-03-02: 500 [IU]
  Filled 2014-03-02: qty 5

## 2014-03-02 MED ORDER — SODIUM CHLORIDE 0.9 % IJ SOLN
10.0000 mL | INTRAMUSCULAR | Status: DC | PRN
  Administered 2014-03-02: 10 mL
  Filled 2014-03-02: qty 10

## 2014-03-02 NOTE — Patient Instructions (Signed)

## 2014-03-05 ENCOUNTER — Ambulatory Visit (HOSPITAL_BASED_OUTPATIENT_CLINIC_OR_DEPARTMENT_OTHER): Payer: Medicare Other

## 2014-03-05 ENCOUNTER — Ambulatory Visit: Payer: Self-pay | Admitting: Oncology

## 2014-03-05 ENCOUNTER — Inpatient Hospital Stay (HOSPITAL_COMMUNITY)
Admission: AD | Admit: 2014-03-05 | Discharge: 2014-03-10 | DRG: 436 | Disposition: A | Discharge status: Home / Self Care | Payer: Medicare Other | Source: Ambulatory Visit | Attending: Oncology | Admitting: Oncology

## 2014-03-05 ENCOUNTER — Encounter (HOSPITAL_COMMUNITY): Payer: Self-pay | Admitting: *Deleted

## 2014-03-05 ENCOUNTER — Telehealth: Payer: Self-pay | Admitting: *Deleted

## 2014-03-05 ENCOUNTER — Other Ambulatory Visit: Payer: Self-pay | Admitting: *Deleted

## 2014-03-05 VITALS — BP 120/67 | HR 72 | Temp 98.9°F | Resp 18

## 2014-03-05 VITALS — BP 90/67 | HR 111 | Temp 98.2°F | Resp 20 | Ht 73.0 in | Wt 197.2 lb

## 2014-03-05 DIAGNOSIS — C259 Malignant neoplasm of pancreas, unspecified: Secondary | ICD-10-CM | POA: Diagnosis present

## 2014-03-05 DIAGNOSIS — R634 Abnormal weight loss: Secondary | ICD-10-CM

## 2014-03-05 DIAGNOSIS — C252 Malignant neoplasm of tail of pancreas: Secondary | ICD-10-CM

## 2014-03-05 DIAGNOSIS — R630 Anorexia: Secondary | ICD-10-CM

## 2014-03-05 DIAGNOSIS — Z951 Presence of aortocoronary bypass graft: Secondary | ICD-10-CM | POA: Diagnosis not present

## 2014-03-05 DIAGNOSIS — R109 Unspecified abdominal pain: Secondary | ICD-10-CM | POA: Diagnosis present

## 2014-03-05 DIAGNOSIS — K5900 Constipation, unspecified: Secondary | ICD-10-CM | POA: Diagnosis present

## 2014-03-05 DIAGNOSIS — Z515 Encounter for palliative care: Secondary | ICD-10-CM

## 2014-03-05 DIAGNOSIS — Z87891 Personal history of nicotine dependence: Secondary | ICD-10-CM

## 2014-03-05 DIAGNOSIS — E119 Type 2 diabetes mellitus without complications: Secondary | ICD-10-CM | POA: Diagnosis present

## 2014-03-05 DIAGNOSIS — D6959 Other secondary thrombocytopenia: Secondary | ICD-10-CM | POA: Diagnosis present

## 2014-03-05 DIAGNOSIS — Z7982 Long term (current) use of aspirin: Secondary | ICD-10-CM

## 2014-03-05 DIAGNOSIS — C787 Secondary malignant neoplasm of liver and intrahepatic bile duct: Secondary | ICD-10-CM | POA: Diagnosis present

## 2014-03-05 DIAGNOSIS — R079 Chest pain, unspecified: Secondary | ICD-10-CM

## 2014-03-05 DIAGNOSIS — E785 Hyperlipidemia, unspecified: Secondary | ICD-10-CM | POA: Diagnosis present

## 2014-03-05 DIAGNOSIS — R3911 Hesitancy of micturition: Secondary | ICD-10-CM

## 2014-03-05 DIAGNOSIS — R110 Nausea: Secondary | ICD-10-CM

## 2014-03-05 DIAGNOSIS — Z794 Long term (current) use of insulin: Secondary | ICD-10-CM

## 2014-03-05 DIAGNOSIS — I2510 Atherosclerotic heart disease of native coronary artery without angina pectoris: Secondary | ICD-10-CM | POA: Diagnosis present

## 2014-03-05 DIAGNOSIS — F329 Major depressive disorder, single episode, unspecified: Secondary | ICD-10-CM

## 2014-03-05 DIAGNOSIS — E43 Unspecified severe protein-calorie malnutrition: Secondary | ICD-10-CM | POA: Insufficient documentation

## 2014-03-05 DIAGNOSIS — Z79899 Other long term (current) drug therapy: Secondary | ICD-10-CM | POA: Diagnosis not present

## 2014-03-05 DIAGNOSIS — R112 Nausea with vomiting, unspecified: Secondary | ICD-10-CM

## 2014-03-05 DIAGNOSIS — Z66 Do not resuscitate: Secondary | ICD-10-CM | POA: Diagnosis present

## 2014-03-05 DIAGNOSIS — F3289 Other specified depressive episodes: Secondary | ICD-10-CM

## 2014-03-05 DIAGNOSIS — Z6826 Body mass index (BMI) 26.0-26.9, adult: Secondary | ICD-10-CM

## 2014-03-05 DIAGNOSIS — T451X5A Adverse effect of antineoplastic and immunosuppressive drugs, initial encounter: Secondary | ICD-10-CM | POA: Diagnosis present

## 2014-03-05 DIAGNOSIS — I10 Essential (primary) hypertension: Secondary | ICD-10-CM | POA: Diagnosis present

## 2014-03-05 LAB — CBC WITH DIFFERENTIAL/PLATELET
BASOS PCT: 0 % (ref 0–1)
Basophils Absolute: 0 10*3/uL (ref 0.0–0.1)
Eosinophils Absolute: 0.1 10*3/uL (ref 0.0–0.7)
Eosinophils Relative: 1 % (ref 0–5)
HCT: 33.4 % — ABNORMAL LOW (ref 39.0–52.0)
HEMOGLOBIN: 11.1 g/dL — AB (ref 13.0–17.0)
Lymphocytes Relative: 25 % (ref 12–46)
Lymphs Abs: 1.1 10*3/uL (ref 0.7–4.0)
MCH: 28.3 pg (ref 26.0–34.0)
MCHC: 33.2 g/dL (ref 30.0–36.0)
MCV: 85.2 fL (ref 78.0–100.0)
MONOS PCT: 8 % (ref 3–12)
Monocytes Absolute: 0.4 10*3/uL (ref 0.1–1.0)
Neutro Abs: 3 10*3/uL (ref 1.7–7.7)
Neutrophils Relative %: 66 % (ref 43–77)
Platelets: 89 10*3/uL — ABNORMAL LOW (ref 150–400)
RBC: 3.92 MIL/uL — ABNORMAL LOW (ref 4.22–5.81)
RDW: 13.7 % (ref 11.5–15.5)
WBC: 4.5 10*3/uL (ref 4.0–10.5)

## 2014-03-05 LAB — COMPREHENSIVE METABOLIC PANEL
ALBUMIN: 3.5 g/dL (ref 3.5–5.2)
ALT: 18 U/L (ref 0–53)
AST: 16 U/L (ref 0–37)
Alkaline Phosphatase: 72 U/L (ref 39–117)
Anion gap: 13 (ref 5–15)
BUN: 22 mg/dL (ref 6–23)
CO2: 25 mEq/L (ref 19–32)
Calcium: 9 mg/dL (ref 8.4–10.5)
Chloride: 96 mEq/L (ref 96–112)
Creatinine, Ser: 1.26 mg/dL (ref 0.50–1.35)
GFR calc Af Amer: 62 mL/min — ABNORMAL LOW (ref >90)
GFR calc non Af Amer: 53 mL/min — ABNORMAL LOW (ref >90)
GLUCOSE: 272 mg/dL — AB (ref 70–99)
POTASSIUM: 4.3 meq/L (ref 3.7–5.3)
SODIUM: 134 meq/L — AB (ref 137–147)
Total Bilirubin: 0.5 mg/dL (ref 0.3–1.2)
Total Protein: 6.8 g/dL (ref 6.0–8.3)

## 2014-03-05 MED ORDER — NITROGLYCERIN 0.4 MG SL SUBL: 0.4000 mg | SUBLINGUAL_TABLET | SUBLINGUAL | Status: DC | PRN

## 2014-03-05 MED ORDER — ENOXAPARIN SODIUM 40 MG/0.4ML ~~LOC~~ SOLN
40.0000 mg | SUBCUTANEOUS | Status: DC
  Administered 2014-03-05 – 2014-03-09 (×5): 40 mg via SUBCUTANEOUS
  Filled 2014-03-05 (×6): qty 0.4

## 2014-03-05 MED ORDER — MORPHINE SULFATE 4 MG/ML IJ SOLN
4.0000 mg | Freq: Once | INTRAMUSCULAR | Status: AC
  Administered 2014-03-05: 4 mg via INTRAVENOUS

## 2014-03-05 MED ORDER — FLEET ENEMA 7-19 GM/118ML RE ENEM
1.0000 | ENEMA | Freq: Every day | RECTAL | Status: DC | PRN
  Administered 2014-03-05: 1 via RECTAL
  Filled 2014-03-05: qty 1

## 2014-03-05 MED ORDER — ACETAMINOPHEN 325 MG PO TABS: 650.0000 mg | ORAL_TABLET | Freq: Four times a day (QID) | ORAL | Status: DC | PRN

## 2014-03-05 MED ORDER — ACETAMINOPHEN 650 MG RE SUPP: 650.0000 mg | Freq: Four times a day (QID) | RECTAL | Status: DC | PRN

## 2014-03-05 MED ORDER — HYDROMORPHONE HCL 1 MG/ML IJ SOLN
1.0000 mg | INTRAMUSCULAR | Status: DC | PRN
  Administered 2014-03-05 – 2014-03-07 (×8): 2 mg via INTRAVENOUS
  Administered 2014-03-07: 1 mg via INTRAVENOUS
  Administered 2014-03-07 – 2014-03-08 (×4): 2 mg via INTRAVENOUS
  Filled 2014-03-05 (×13): qty 2

## 2014-03-05 MED ORDER — SODIUM CHLORIDE 0.9 % IV SOLN
INTRAVENOUS | Status: DC
  Administered 2014-03-05 – 2014-03-10 (×8): via INTRAVENOUS

## 2014-03-05 MED ORDER — MORPHINE SULFATE 4 MG/ML IJ SOLN
INTRAMUSCULAR | Status: AC
  Filled 2014-03-05: qty 1

## 2014-03-05 MED ORDER — SORBITOL 70 % PO SOLN: 15.0000 mL | Freq: Every day | ORAL | Status: DC | PRN

## 2014-03-05 MED ORDER — SENNA 8.6 MG PO TABS
1.0000 | ORAL_TABLET | Freq: Two times a day (BID) | ORAL | Status: DC
  Administered 2014-03-05 – 2014-03-09 (×9): 8.6 mg via ORAL
  Filled 2014-03-05 (×9): qty 1

## 2014-03-05 MED ORDER — MORPHINE SULFATE (CONCENTRATE) 20 MG/ML PO SOLN: ORAL | Status: DC

## 2014-03-05 MED ORDER — ONDANSETRON 8 MG/50ML IVPB (CHCC)
8.0000 mg | Freq: Once | INTRAVENOUS | Status: AC
  Administered 2014-03-05: 8 mg via INTRAVENOUS

## 2014-03-05 MED ORDER — SODIUM CHLORIDE 0.9 % IV SOLN
Freq: Once | INTRAVENOUS | Status: AC
  Administered 2014-03-05: 14:00:00 via INTRAVENOUS

## 2014-03-05 MED ORDER — SORBITOL 70 % PO SOLN
30.0000 mL | Freq: Every day | ORAL | Status: DC | PRN
  Administered 2014-03-06: 30 mL via ORAL
  Filled 2014-03-05: qty 30

## 2014-03-05 MED ORDER — ONDANSETRON 8 MG/NS 50 ML IVPB
INTRAVENOUS | Status: AC
  Filled 2014-03-05: qty 8

## 2014-03-05 MED ORDER — ONDANSETRON HCL 4 MG PO TABS: 4.0000 mg | ORAL_TABLET | Freq: Four times a day (QID) | ORAL | Status: DC | PRN

## 2014-03-05 MED ORDER — ONDANSETRON HCL 4 MG/2ML IJ SOLN
4.0000 mg | Freq: Four times a day (QID) | INTRAMUSCULAR | Status: DC | PRN
  Administered 2014-03-05 – 2014-03-06 (×4): 4 mg via INTRAVENOUS
  Filled 2014-03-05 (×4): qty 2

## 2014-03-05 MED ORDER — MORPHINE SULFATE ER 30 MG PO TBCR
60.0000 mg | EXTENDED_RELEASE_TABLET | Freq: Two times a day (BID) | ORAL | Status: DC
  Administered 2014-03-05 – 2014-03-09 (×8): 60 mg via ORAL
  Filled 2014-03-05 (×8): qty 2

## 2014-03-05 MED ORDER — INSULIN DETEMIR 100 UNIT/ML ~~LOC~~ SOLN
40.0000 [IU] | Freq: Every day | SUBCUTANEOUS | Status: DC
  Administered 2014-03-05 – 2014-03-09 (×5): 40 [IU] via SUBCUTANEOUS
  Filled 2014-03-05 (×5): qty 0.4

## 2014-03-05 NOTE — Addendum Note (Signed)
Addended by: [name] B on: 03/05/2014 05:12 PM   Modules accepted: Level of Service

## 2014-03-05 NOTE — H&P (Signed)
Patient History and Physical   AURYN PAIGE [id number] [birth year] 79 y.o. 03/05/2014    Patient Identification: Metastatic pancreas cancer admitted with severe abdominal pain  HPI:  Mr. Eskelson has developed increased left-sided abdominal/flank pain over the past month. He was admitted 02/12/2014. A CT of the abdomen revealed no acute findings. He was seen in the emergency room with abdominal pain 02/20/2014.  Mr. Higginbotham completed a second cycle of FOLFOX chemotherapy beginning 02/28/2014. He reports the onset of severe left abdominal pain beginning 03/02/2014. He has constipation and nausea. He has also noted urinary hesitancy.  He presented to the office for evaluation of the pain today. He was given intravenous morphine (12 mg) without relief of the pain. He is now admitted for further evaluation.  PMH:  Past Medical History  Diagnosis Date  . Diabetes mellitus     type 2  . Kidney stones   . HTN (hypertension)   . HLD (hyperlipidemia)   . CAD (coronary artery disease)     s/p 4v CABG 1990s and redo CABG 2006; cath 2007 showed patent grafts  . Carotid disease, bilateral     06/2010 carotid doppler <40% RICA stenosis and 01-60% LICA stenosis  . Gastric ulcer 2003  . Diverticulitis   . Anal fissure     s/p repair  . Shingles   . Cervical spondylosis   . Sacroiliitis   . Pancreas cancer 10/2012  . Bulging disc     Past Surgical History  Procedure Laterality Date  . Coronary artery bypass graft      1990s (SVG OM2/OM3, SVG to RCA, LIMA to LAD) and redo 2006 (reverse SVT to OM & distal LCx, reverse SVG to PDA, preservation of LIMA)  . Rotator cuff repair      Left  . Knee arthroscopy      Right    Allergies:  Allergies  Allergen Reactions  . Altace [Ramipril] Cough    cough     Medications:  C. electronically medical record Social History:    reports that he quit smoking about 11 years ago. He does not have any smokeless tobacco history on file. He  reports that he does not drink alcohol or use illicit drugs.  Family History:  Family History  Problem Relation Age of Onset  . Cancer Father     Review of Systems:  Positives include: Nausea, left abdomen/flank pain, constipation, urinary hesitancy  A complete ROS was otherwise negative.   Physical Exam:  There were no vitals taken for this visit.  HEENT: No thrush, erythema at the soft palate Lungs: Clear bilaterally Cardiac: Regular rate and rhythm Abdomen: No hepatomegaly, no mass, tender throughout the left abdomen.  Vascular: No leg edema Neurologic: Alert and oriented, pulse remains, the motor exam appears intact in the lower extremity bilaterally Musculoskeletal: No tenderness at the left flank  Lab Results:  Lab Results  Component Value Date   WBC 4.1 02/28/2014   HGB 10.7* 02/28/2014   HCT 33.0* 02/28/2014   MCV 86.3 02/28/2014   PLT 106* 02/28/2014   NEUTROABS 2.9 02/28/2014     Impression and Plan:  1. Metastatic pancreas cancer, initial diagnosis May 2014, pancreas tail mass with liver metastases  Currently being treated with salvage FOLFOX chemotherapy, status post cycle 2 on 02/28/2014  2. Left abdominal pain-likely secondary to pancreas cancer  3. constipation secondary to chemotherapy and narcotic analgesics  4. Diabetes  5. history of coronary artery disease  6. thrombocytopenia secondary to chemotherapy  7. anorexia/weight loss  Mr. Dimaggio presents with progressive abdominal pain. The pain has increased over the past month. I think it is very likely the pain is related to the pancreas cancer. He will be admitted for narcotic analgesics. We will consider asking gastroenterology to perform a celiac block.  I discussed CPR and ACLS issues with Mr. Brager and his wife. He will be placed on a no CODE BLUE status.  Plan:  1. Narcotic analgesics 2. bowel regimen 3. GI consult to consider a celiac block 4. DVT prophylaxis with Lovenox      [name], MD 03/05/2014, 5:30 PM

## 2014-03-05 NOTE — Progress Notes (Signed)
PAC dressing changed, saline locked, blood return noted. Report called to 3W to [facility]. Patient taken to admitting with wife via wheelchair.

## 2014-03-05 NOTE — Progress Notes (Signed)
MS 4 mg IV given for c/o pain 10/10 and repeated 30 minutes later for continued c/o pain 10/10 left side abdomen.   Zofran was given for c/o nausea.  30 min after second dose of MS,  Pt states pain down to 8/10,  Still "bad" per pt..  Another MS 4 mg IV was given per Dr. Benay Spice.   See MAR.   At 4:20 pm pt and after 12 mg total of MS IV,  Pt states pain back up to 10/10.  He states no relief from MS.   Pt has gone to Rest room about 5 times in the past few hours.  C/o feeling constipated and feels like he has to have a BM but states has only been able to have small BM.  Dr. Benay Spice notified of above.  He states pt can either be admitted to hospital or can go home and try new Rx for increase in MS Contin and Roxanol.  Dr. Benay Spice also instructed pt to use fleets enema at home tonight. Pt states he would rather just go into the hospital now.

## 2014-03-05 NOTE — Telephone Encounter (Signed)
Pt's wife called; reports "he has been in severe pain for 3 days and now he is vomiting"  Wife states that he did eat dinner last night and it stayed down but this am vomiting; left side pain "he just took Dilaudid at 10:30"  Request to be seen ASAP.  Discussed with Dr. Benay Spice; notified pt wife that Dr. Benay Spice will work him in this am and to come on now.  Wife verbalized understanding and confirmed they will be on their way.

## 2014-03-05 NOTE — Patient Instructions (Signed)

## 2014-03-05 NOTE — Progress Notes (Addendum)
[facility] OFFICE PROGRESS NOTE   Diagnosis: Pancreas cancer  INTERVAL HISTORY:   He returns prior to a scheduled visit. He complains of severe left abdominal pain radiating toward the left flank. The pain is not relieved with MS Contin and hydromorphone. He completed cycle 2 FOLFOX on 02/28/2014. He reports constipation and urinary hesitancy. He also has nausea.  Objective:  Vital signs in last 24 hours:  Blood pressure 90/67, pulse 111, temperature 98.2 F (36.8 C), temperature source Oral, resp. rate 20, height 6\' 1"  (1.854 m), weight 197 lb 3.2 oz (89.449 kg).    HEENT: No thrush, erythema at the soft palate Resp: Lungs clear bilaterally Cardio: Regular rate and rhythm GI: No hepatomegaly, no mass, tender throughout the left abdomen. Vascular: No leg edema    Portacath/PICC-without erythema  Lab Results:  Lab Results  Component Value Date   WBC 4.1 02/28/2014   HGB 10.7* 02/28/2014   HCT 33.0* 02/28/2014   MCV 86.3 02/28/2014   PLT 106* 02/28/2014   NEUTROABS 2.9 02/28/2014     Imaging:  No results found.  Medications: I have reviewed the patient's current medications.  Assessment/Plan: 1. Adenocarcinoma of the pancreas status post ultrasound-guided biopsy of a liver lesion 10/18/2012 consistent with metastatic pancreas cancer. CT 10/11/2012 revealed a pancreas tail mass and liver metastases. CA 19-9 in normal range on 10/24/2012. Initiation of gemcitabine/Abraxane on a day 1, day 8 day 15 schedule on 10/28/2012. Current schedule is day one/day 8.  Restaging CT 01/26/2013 revealed a decrease in the size of liver metastases and no new lesions  Gemcitabine/Abraxane continued on a day 1, day 8 schedule.  Restaging CT 04/25/2013 with a decrease in the size of the pancreas mass and liver lesions.  Gemcitabine/Abraxane continued on a day 1, day 8 schedule.  Restaging CT 08/08/2013-further regression of the pancreas mass, stable liver lesions.    Gemcitabine/Abraxane continued on a day 1, day 8 schedule.  Gemcitabine/Abraxane changed to an every 2 week schedule beginning 10/11/2013.  Restaging CT abdomen/pelvis 12/26/2013 with interval enlargement of a peripherally enhancing lesion in the right hepatic lobe; stable lesion left hepatic lobe; interval mild progression of the mass involving the pancreatic tail.  Cycle 1 FOLFOX 02/07/2014. 2. Abdominal pain secondary to the pancreas mass. Progressive. 3. Anorexia/weight loss. He has again developed anorexia. 4. Diabetes. Exacerbated by steroid premedication. Decadron was reduced to 5 mg after cycle 1 day 1. 5. History of coronary artery disease. 6. Headache following cycle 1 day 1 gemcitabine/Abraxane. Question related to Zofran.  7. History of thrombocytopenia secondary to chemotherapy. 8. 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 Question secondary to Abraxane versus diabetes.  9. Depression-he declined an antidepressant. Counseling services were offered as well. 10. Thrombocytopenia secondary to chemotherapy.       11. MRI lumbar spine 02/14/2014 with disc protrusion L5-S1 with foraminal encroachment bilaterally right greater than left.    Disposition:  Mr. Varkey has metastatic pancreas cancer. He has completed 2 cycles of salvage therapy with FOLFOX. His abdominal pain has progressed. He appears uncomfortable today. He will receive intravenous fluids, antiemetics, and IV morphine in the chemotherapy infusion room today. We increase the MS Contin to 60 mg every 12 hours and he will use Roxanol for breakthrough pain.   Mr. Weatherall return for an office visit 03/08/2014. We will consider referring him for a celiac block if the pain cannot be controlled with narcotics.  [name], MD  03/05/2014  1:08 PM   The abdominal pain was not relieved with IV morphine. He will be admitted for further evaluation.

## 2014-03-06 LAB — GLUCOSE, CAPILLARY
Glucose-Capillary: 326 mg/dL — ABNORMAL HIGH (ref 70–99)
Glucose-Capillary: 144 mg/dL — ABNORMAL HIGH (ref 70–99)
Glucose-Capillary: 194 mg/dL — ABNORMAL HIGH (ref 70–99)
Glucose-Capillary: 262 mg/dL — ABNORMAL HIGH (ref 70–99)
Glucose-Capillary: 216 mg/dL — ABNORMAL HIGH (ref 70–99)

## 2014-03-06 MED ORDER — BOOST PLUS PO LIQD
237.0000 mL | Freq: Two times a day (BID) | ORAL | Status: DC
  Administered 2014-03-06 – 2014-03-09 (×6): 237 mL via ORAL
  Filled 2014-03-06 (×10): qty 237

## 2014-03-06 MED ORDER — PROMETHAZINE HCL 25 MG/ML IJ SOLN
6.2500 mg | Freq: Four times a day (QID) | INTRAMUSCULAR | Status: DC | PRN
  Administered 2014-03-06 – 2014-03-07 (×2): 6.25 mg via INTRAVENOUS
  Filled 2014-03-06 (×2): qty 1

## 2014-03-06 MED ORDER — PROMETHAZINE HCL 25 MG/ML IJ SOLN
6.2500 mg | Freq: Four times a day (QID) | INTRAMUSCULAR | Status: AC | PRN
  Administered 2014-03-06: 6.25 mg via INTRAVENOUS
  Filled 2014-03-06: qty 1

## 2014-03-06 MED ORDER — INSULIN ASPART 100 UNIT/ML ~~LOC~~ SOLN
0.0000 [IU] | Freq: Three times a day (TID) | SUBCUTANEOUS | Status: DC
  Administered 2014-03-06: 8 [IU] via SUBCUTANEOUS
  Administered 2014-03-06: 3 [IU] via SUBCUTANEOUS
  Administered 2014-03-07: 5 [IU] via SUBCUTANEOUS
  Administered 2014-03-07 – 2014-03-08 (×3): 3 [IU] via SUBCUTANEOUS
  Administered 2014-03-09: 8 [IU] via SUBCUTANEOUS
  Administered 2014-03-09: 3 [IU] via SUBCUTANEOUS
  Administered 2014-03-10: 2 [IU] via SUBCUTANEOUS

## 2014-03-06 NOTE — Progress Notes (Signed)
Patient reported around 12 this am that he felt "dizzy". CBG was checked and blood sugar was 326.  Dr. Inda Merlin was notified who was on call for Dr. Benay Spice.  Patient is normally on Diabetes pills at home but d/c'd while here.  He did receive his nighttime dose of 40 units Levemir.  Dr. Inda Merlin would like to wait till am since patient has received Levemir for MD to address insulin coverage.Azzie Glatter [place]

## 2014-03-06 NOTE — Progress Notes (Signed)
INITIAL NUTRITION ASSESSMENT  DOCUMENTATION CODES Per approved criteria  -Severe malnutrition in the context of chronic illness  Pt meets criteria for severe MALNUTRITION in the context of chronic illness as evidenced by PO intake < 75% for approximately one month, 4% body weight loss in two weeks.   INTERVENTION: -Recommend Boost Plus BID -Encouraged intake of bland foods -RD to continue to monitor  NUTRITION DIAGNOSIS: Inadequate oral intake related to nausea/vomiting as evidenced by PO intake < 75%, wt loss.   Goal: Pt to meet >/= 90% of their estimated nutrition needs    Monitor:  Total protein/energy intake, labs, weights, GI profile  Reason for Assessment: MST  79 y.o. male  Admitting Dx: <principal problem not specified>  ASSESSMENT: Christian Kaiser has developed increased left-sided abdominal/flank pain over the past month. He was admitted 02/12/2014. A CT of the abdomen revealed no acute findings. He was seen in the emergency room with abdominal pain 02/20/2014. Christian Kaiser completed a second cycle of FOLFOX chemotherapy beginning 02/28/2014. He reports the onset of severe left abdominal pain beginning 03/02/2014. He has constipation and nausea. He has also noted urinary hesitancy.  -Pt reported minimal intake pta d/t nausea and subsequent vomiting -Has been followed by outpatient oncology RD, reported pt with good appetite in 09/2013, however, was experiencing nausea, uncontrolled blood glucose (hypo and hyper) and decreased appetite during 02/2014 visit -Pt educated on bland, easy to tolerate foods with incorporation of nutrition supplements. Was advised to continue with nausea medication.  -Per RN, pt's nausea liked induced by Dilaudid. Phenergan has been added -Pt with <25% PO intake of breakfast as it induced vomiting. Was willing to trial Boost supplements as nutrition alternative -Endorsed weight loss. Has lost 8 lbs in past two weeks (4% body weight, severe for time  frame) -Suffers from constipation; Sorbitol has been ordered -Per oncology, pt no longer candidate for chemotherapy  Height: Ht Readings from Last 1 Encounters:  03/05/14 6\' 1"  (1.854 m)    Weight: Wt Readings from Last 1 Encounters:  03/05/14 197 lb 1.5 oz (89.4 kg)    Ideal Body Weight: 184 lbs  % Ideal Body Weight: 107%  Wt Readings from Last 10 Encounters:  03/05/14 197 lb 1.5 oz (89.4 kg)  03/05/14 197 lb 3.2 oz (89.449 kg)  02/21/14 205 lb (92.987 kg)  02/12/14 203 lb 12.8 oz (92.443 kg)  01/31/14 211 lb 8 oz (95.936 kg)  12/27/13 212 lb 8 oz (96.389 kg)  12/06/13 214 lb 8 oz (97.297 kg)  11/08/13 217 lb 3.2 oz (98.521 kg)  10/11/13 216 lb 9.6 oz (98.249 kg)  09/13/13 214 lb 11.2 oz (97.387 kg)    Usual Body Weight: 214 lb  % Usual Body Weight: 92%  BMI:  Body mass index is 26.01 kg/(m^2).  Estimated Nutritional Needs: Kcal: 7619-5093 Protein: 110-120 gram Fluid: >/=2200 ml/daily  Skin: WDL  Diet Order: General  EDUCATION NEEDS: -Education needs addressed   Intake/Output Summary (Last 24 hours) at 03/06/14 1014 Last data filed at 03/06/14 0504  Gross per 24 hour  Intake 1046.25 ml  Output      0 ml  Net 1046.25 ml    Last BM: 9/21   Labs:   Recent Labs Lab 02/28/14 0935 03/05/14 1737  NA 138 134*  K 4.2 4.3  CL  --  96  CO2 24 25  BUN 13.3 22  CREATININE 1.4* 1.26  CALCIUM 9.0 9.0  GLUCOSE 340* 272*    CBG (last 3)  Recent Labs  03/05/14 2358 03/06/14 0728  GLUCAP 326* 144*    Scheduled Meds: . enoxaparin (LOVENOX) injection  40 mg Subcutaneous Q24H  . insulin aspart  0-15 Units Subcutaneous TID WC  . insulin detemir  40 Units Subcutaneous QHS  . morphine  60 mg Oral Q12H  . senna  1 tablet Oral BID    Continuous Infusions: . sodium chloride 75 mL/hr at 03/06/14 9371    Past Medical History  Diagnosis Date  . Diabetes mellitus     type 2  . Kidney stones   . HTN (hypertension)   . HLD (hyperlipidemia)   .  CAD (coronary artery disease)     s/p 4v CABG 1990s and redo CABG 2006; cath 2007 showed patent grafts  . Carotid disease, bilateral     06/2010 carotid doppler <40% RICA stenosis and 69-67% LICA stenosis  . Gastric ulcer 2003  . Diverticulitis   . Anal fissure     s/p repair  . Shingles   . Cervical spondylosis   . Sacroiliitis   . Pancreas cancer 10/2012  . Bulging disc     Past Surgical History  Procedure Laterality Date  . Coronary artery bypass graft      1990s (SVG OM2/OM3, SVG to RCA, LIMA to LAD) and redo 2006 (reverse SVT to OM & distal LCx, reverse SVG to PDA, preservation of LIMA)  . Rotator cuff repair      Left  . Knee arthroscopy      Right   Atlee Abide MS RD LDN Clinical Dietitian [phone]

## 2014-03-06 NOTE — Care Management Note (Signed)
CARE MANAGEMENT NOTE 03/06/2014  Patient:  BENAIAH, BEHAN   Account Number:  [id number]  Date Initiated:  03/06/2014  Documentation initiated by:  Leafy Kindle  Subjective/Objective Assessment:   79 yo male admitted with abd pain.  Hx of metstatic pancreatic CA     Action/Plan:   From home with wife   Anticipated DC Date:  03/08/2014   Anticipated DC Plan:  [place]  CM consult      Choice offered to / List presented to:             Status of service:  In process, will continue to follow Medicare Important Message given?   (If response is "NO", the following Medicare IM given date fields will be blank) Date Medicare IM given:   Medicare IM given by:   Date Additional Medicare IM given:   Additional Medicare IM given by:    Discharge Disposition:    Per UR Regulation:  Reviewed for med. necessity/level of care/duration of stay  If discussed at [place] of Stay Meetings, dates discussed:    Comments:  03/06/14 Marney Doctor RN,BSN,NCM Consult for CM for home hospice.  Per Dr Gearldine Shown note pt agreeable to [facility] referral.  I attempted to speak to pt and wife about home hospice, and pt states that he doesn't remember having that conversation with Dr Benay Spice.  Pts wife looked confused when I brought it up and I asked if they would like to talk more to Dr. Benay Spice about this and they stated that they would.  I asked the RN Wende Crease to call Dr. Benay Spice to have another conversation with pt and wife about home hospice.  Wende Crease states that he will have conversation this evening or tomorrow morning.  CM will continue to follow.

## 2014-03-06 NOTE — Progress Notes (Signed)
IP PROGRESS NOTE  Subjective:   He reports improvement in the abdominal pain. He has nausea after receiving Dilaudid. He has vomited several times since hospital admission. Small bowel movement.  Objective: Vital signs in last 24 hours: Blood pressure 116/77, pulse 62, temperature 98.3 F (36.8 C), temperature source Oral, resp. rate 16, height 6\' 1"  (1.854 m), weight 197 lb 1.5 oz (89.4 kg), SpO2 100.00%.  Intake/Output from previous day: 09/21 0701 - 09/22 0700 In: 1046.3 [P.O.:240; I.V.:806.3] Out: -   Physical Exam:  HEENT: No thrush Lungs: Clear bilaterally Cardiac: Regular rate and rhythm Abdomen: Soft, nontender Extremities: No leg edema   Portacath/PICC-without erythema  Lab Results:  Recent Labs  03/05/14 1737  WBC 4.5  HGB 11.1*  HCT 33.4*  PLT 89*    BMET  Recent Labs  03/05/14 1737  NA 134*  K 4.3  CL 96  CO2 25  GLUCOSE 272*  BUN 22  CREATININE 1.26  CALCIUM 9.0    Studies/Results: No results found.  Medications: I have reviewed the patient's current medications.  Assessment/Plan: 1.Metastatic pancreas cancer, initial diagnosis May 2014, pancreas tail mass with liver metastases  Most recently treated with salvage FOLFOX chemotherapy, status post cycle 2 on 02/28/2014 2. Left abdominal pain-likely secondary to pancreas cancer , improved today 3. constipation secondary to chemotherapy and narcotic analgesics  4. Diabetes  5. history of coronary artery disease  6. thrombocytopenia secondary to chemotherapy  7. anorexia/weight loss  8.  Nausea and vomiting-potentially related to narcotics versus pancreas cancer     He appears more comfortable today. The plan is to continue MS Contin and Dilaudid. We will add Phenergan for nausea. He will take sorbitol for constipation.   I do not recommend further chemotherapy. He appears to be a candidate for home Hospice care. Mr. Raver agrees to a [facility] referral.    LOS: 1 day    [name]  03/06/2014, 8:29 AM

## 2014-03-07 ENCOUNTER — Encounter: Payer: Self-pay | Admitting: Nurse Practitioner

## 2014-03-07 DIAGNOSIS — C250 Malignant neoplasm of head of pancreas: Secondary | ICD-10-CM

## 2014-03-07 DIAGNOSIS — R634 Abnormal weight loss: Secondary | ICD-10-CM | POA: Insufficient documentation

## 2014-03-07 DIAGNOSIS — R109 Unspecified abdominal pain: Secondary | ICD-10-CM

## 2014-03-07 DIAGNOSIS — R630 Anorexia: Secondary | ICD-10-CM | POA: Insufficient documentation

## 2014-03-07 DIAGNOSIS — N289 Disorder of kidney and ureter, unspecified: Secondary | ICD-10-CM | POA: Insufficient documentation

## 2014-03-07 DIAGNOSIS — E860 Dehydration: Secondary | ICD-10-CM | POA: Insufficient documentation

## 2014-03-07 DIAGNOSIS — R197 Diarrhea, unspecified: Secondary | ICD-10-CM | POA: Insufficient documentation

## 2014-03-07 DIAGNOSIS — R112 Nausea with vomiting, unspecified: Secondary | ICD-10-CM

## 2014-03-07 LAB — GLUCOSE, CAPILLARY
GLUCOSE-CAPILLARY: 205 mg/dL — AB (ref 70–99)
GLUCOSE-CAPILLARY: 151 mg/dL — AB (ref 70–99)
GLUCOSE-CAPILLARY: 132 mg/dL — AB (ref 70–99)

## 2014-03-07 MED ORDER — PROMETHAZINE HCL 25 MG PO TABS
12.5000 mg | ORAL_TABLET | Freq: Four times a day (QID) | ORAL | Status: DC | PRN
  Administered 2014-03-08: 12.5 mg via ORAL
  Filled 2014-03-07: qty 1

## 2014-03-07 MED ORDER — MORPHINE SULFATE (CONCENTRATE) 10 MG /0.5 ML PO SOLN
20.0000 mg | ORAL | Status: DC | PRN
  Administered 2014-03-07 – 2014-03-08 (×2): 40 mg via ORAL
  Filled 2014-03-07 (×2): qty 2

## 2014-03-07 NOTE — Assessment & Plan Note (Signed)
Patient continues to complain of occasionally uncontrolled generalized pain.  I did confirm the patient is taking MS Contin 60 mg twice daily; as well as Percocet for his breakthrough pain.  It may need to continue to adjust/increase the patient's pain medication for better control.

## 2014-03-07 NOTE — Assessment & Plan Note (Signed)
Patient uncontrolled  blood sugars does appear to be doing much better.  Patient had initially decreased his Levemir from 40 units at bedtime down to 20 units.  Patient's primary care provider Dr. Felipa Eth actually decreased the liver there down further to only 10 units per night.  Patient states his blood sugars have been much better since that time.  He will continue to follow up with his provider provider for further management of his diabetes.

## 2014-03-07 NOTE — Assessment & Plan Note (Signed)
Patient here today for pump discontinuation of his Folfirinox chemotherapy pump.  He has plans to return on 03/15/1999 for his next cycle of chemotherapy.

## 2014-03-07 NOTE — Assessment & Plan Note (Signed)
Patient does have a history of chronic renal insufficiency; and his creatinine does remain a stable at 1.4.

## 2014-03-07 NOTE — Assessment & Plan Note (Signed)
Patient is also complaining of nausea and vomiting for the past few days.  This is most likely a chemotherapy side effect. He feels fairly dehydrated.  Patient will be hydrated today with IV fluids while at the [facility].  Patient does already have anti--nausea medications at home to use on an as-needed basis.

## 2014-03-07 NOTE — Care Management Note (Signed)
CARE MANAGEMENT NOTE 03/07/2014  Patient:  Christian Kaiser, Christian Kaiser   Account Number:  [id number]  Date Initiated:  03/06/2014  Documentation initiated by:  Leafy Kindle  Subjective/Objective Assessment:   79 yo male admitted with abd pain.  Hx of metstatic pancreatic CA     Action/Plan:   From home with wife   Anticipated DC Date:  03/08/2014   Anticipated DC Plan:  [place]  CM consult      Choice offered to / List presented to:  C-1 Patient           Status of service:  In process, will continue to follow Medicare Important Message given?   (If response is "NO", the following Medicare IM given date fields will be blank) Date Medicare IM given:   Medicare IM given by:   Date Additional Medicare IM given:   Additional Medicare IM given by:    Discharge Disposition:    Per UR Regulation:  Reviewed for med. necessity/level of care/duration of stay  If discussed at [place] of Stay Meetings, dates discussed:    Comments:  03/07/14 Marney Doctor RN,BSN,NCM Met with pt and wife.  When asked about DC plan pt states that he is going home with Hospice.  Pt and wife chose [facility] when given choice. Margie from [facility] given referral.  CM will continue to follow.  03/06/14 Marney Doctor RN,BSN,NCM Consult for CM for home hospice.  Per Dr Gearldine Shown note pt agreeable to [facility] referral.  I attempted to speak to pt and wife about home hospice, and pt states that he doesn't remember having that conversation with Dr Benay Spice.  Pts wife looked confused when I brought it up and I asked if they would like to talk more to Dr. Benay Spice about this and they stated that they would.  I asked the RN Wende Crease to call Dr. Benay Spice to have another conversation with pt and wife about home hospice.  Wende Crease states that he will have conversation this evening or tomorrow morning.  CM will continue to follow.

## 2014-03-07 NOTE — Progress Notes (Signed)
[place]   Chief Complaint  Patient presents with  . Pain    HPI: Christian Kaiser 79 y.o. male diagnosed with pancreatic cancer.  Currently undergoing Folfirinox chemotherapy regimen.  Patient presented today to receive cycle 2 of his chemotherapy regimen.  He continues to complain of some fairly significant fatigue and generalized pain.  He states he has minimal appetite and continues to lose weight.  He is also nauseous and is occasionally vomiting.  He has been experiencing some diarrhea for the past few days as well.  He feels dehydrated today.  He denies any recent fevers or chills.  He did confirm that he is taking MS Contin 60 mg twice daily; and using Percocet for breakthrough pain.  Also, patient states that he has been awakening in the morning with a very blood sugar down to 40 at times.   HPI  CURRENT THERAPY: Upcoming Treatment Dates - PANCREAS FOLFIRINOX q14d Days with orders from any treatment category:  03/14/2014      SCHEDULING COMMUNICATION      Dexamethasone Sodium Phosphate (DECADRON) injection 20 mg      fosaprepitant (EMEND) 150 mg in sodium chloride 0.9 % 145 mL IVPB      palonosetron (ALOXI) injection 0.25 mg      oxaliplatin (ELOXATIN) 145 mg in dextrose 5 % 500 mL chemo infusion      leucovorin 888 mg in dextrose 5 % 250 mL infusion      irinotecan (CAMPTOSAR) 400 mg in dextrose 5 % 500 mL chemo infusion      fluorouracil (ADRUCIL) 5,350 mg in sodium chloride 0.9 % 150 mL chemo infusion      atropine injection 0.5 mg      sodium chloride 0.9 % injection 10 mL      heparin lock flush 100 unit/mL      heparin lock flush 100 unit/mL      alteplase (CATHFLO ACTIVASE) injection 2 mg      sodium chloride 0.9 % injection 3 mL      Hot Pack 1 packet      Cold Pack 1 packet      dextrose 5 % solution      TREATMENT CONDITIONS 03/16/2014      SCHEDULING COMMUNICATION      sodium chloride 0.9 % injection 10 mL      heparin lock flush 100  unit/mL      heparin lock flush 100 unit/mL      alteplase (CATHFLO ACTIVASE) injection 2 mg      sodium chloride 0.9 % injection 3 mL 03/28/2014      SCHEDULING COMMUNICATION      Dexamethasone Sodium Phosphate (DECADRON) injection 20 mg      fosaprepitant (EMEND) 150 mg in sodium chloride 0.9 % 145 mL IVPB      palonosetron (ALOXI) injection 0.25 mg      oxaliplatin (ELOXATIN) 145 mg in dextrose 5 % 500 mL chemo infusion      leucovorin 888 mg in dextrose 5 % 250 mL infusion      irinotecan (CAMPTOSAR) 400 mg in dextrose 5 % 500 mL chemo infusion      fluorouracil (ADRUCIL) 5,350 mg in sodium chloride 0.9 % 150 mL chemo infusion      atropine injection 0.5 mg      sodium chloride 0.9 % injection 10 mL      heparin lock flush 100 unit/mL      heparin lock  flush 100 unit/mL      alteplase (CATHFLO ACTIVASE) injection 2 mg      sodium chloride 0.9 % injection 3 mL      Hot Pack 1 packet      Cold Pack 1 packet      dextrose 5 % solution      TREATMENT CONDITIONS    ROS  Past Medical History  Diagnosis Date  . Diabetes mellitus     type 2  . Kidney stones   . HTN (hypertension)   . HLD (hyperlipidemia)   . CAD (coronary artery disease)     s/p 4v CABG 1990s and redo CABG 2006; cath 2007 showed patent grafts  . Carotid disease, bilateral     06/2010 carotid doppler <40% RICA stenosis and 17-61% LICA stenosis  . Gastric ulcer 2003  . Diverticulitis   . Anal fissure     s/p repair  . Shingles   . Cervical spondylosis   . Sacroiliitis   . Pancreas cancer 10/2012  . Bulging disc     Past Surgical History  Procedure Laterality Date  . Coronary artery bypass graft      1990s (SVG OM2/OM3, SVG to RCA, LIMA to LAD) and redo 2006 (reverse SVT to OM & distal LCx, reverse SVG to PDA, preservation of LIMA)  . Rotator cuff repair      Left  . Knee arthroscopy      Right    has Chest pain; Hypertension; ARF (acute renal failure); Hyperglycemia; Hyperlipidemia; Anemia; CAD  (coronary artery disease); Morbid obesity; Diabetes mellitus type 2, uncontrolled; Thrombocytopenia; Malignant neoplasm of pancreas, part unspecified; Cancer associated pain; Abdominal pain; Diabetes mellitus type 2, controlled; Nausea with vomiting; Pancreas cancer; Dehydration; Diarrhea; Anorexia; Weight loss; and Renal insufficiency on his problem list.     is allergic to altace.    Medication List       This list is accurate as of: 02/28/14 11:59 PM.  Always use your most recent med list.               aspirin EC 81 MG tablet  Take 81 mg by mouth daily.     bisacodyl 5 MG EC tablet  Commonly known as:  DULCOLAX  Take 5 mg by mouth daily as needed for mild constipation or moderate constipation.     dicyclomine 20 MG tablet  Commonly known as:  BENTYL  Take 1 tablet (20 mg total) by mouth 2 (two) times daily.     diphenhydramine-acetaminophen 25-500 MG Tabs  Commonly known as:  TYLENOL PM  Take 2 tablets by mouth at bedtime as needed (sleep).     Fish Oil 1200 MG Caps  Take 1,200 mg by mouth 2 (two) times daily.     glimepiride 4 MG tablet  Commonly known as:  AMARYL  Take 4 mg by mouth 2 (two) times daily.     HYDROcodone-acetaminophen 5-325 MG per tablet  Commonly known as:  NORCO/VICODIN  Take 1 tablet by mouth every 6 (six) hours as needed for moderate pain.     HYDROmorphone 2 MG tablet  Commonly known as:  DILAUDID  Take 1-2 tablets (2-4 mg total) by mouth every 4 (four) hours as needed for severe pain.     insulin detemir 100 UNIT/ML injection  Commonly known as:  LEVEMIR  Inject 40 Units into the skin at bedtime.     isosorbide mononitrate 60 MG 24 hr tablet  Commonly known as:  IMDUR  Take 1 tablet (60 mg total) by mouth every morning.     JUST TEARS EYE DROPS OP  Apply 1 drop to eye as needed (dry eyes).     lidocaine-prilocaine cream  Commonly known as:  EMLA  Apply topically as needed. apply over port 1-2 hours prior to chemotherapy.      LORazepam 0.5 MG tablet  Commonly known as:  ATIVAN  Take 1 tablet (0.5 mg total) by mouth every 8 (eight) hours as needed (PRN nausea).     metFORMIN 1000 MG tablet  Commonly known as:  GLUCOPHAGE  Take 1,000 mg by mouth 2 (two) times daily with a meal.     morphine 30 MG 12 hr tablet  Commonly known as:  MS CONTIN  Take 1 tablet (30 mg total) by mouth every 12 (twelve) hours.     nitroGLYCERIN 0.4 MG SL tablet  Commonly known as:  NITROSTAT  Place 0.4 mg under the tongue every 5 (five) minutes as needed for chest pain.     NOVOFINE 32G X 6 MM Misc  Generic drug:  Insulin Pen Needle     ONE TOUCH ULTRA TEST test strip  Generic drug:  glucose blood     onetouch ultrasoft lancets     oxyCODONE-acetaminophen 5-325 MG per tablet  Commonly known as:  PERCOCET/ROXICET  Take 1-2 tablets by mouth every 4 (four) hours as needed for severe pain.     prochlorperazine 10 MG tablet  Commonly known as:  COMPAZINE  Take 1 tablet (10 mg total) by mouth every 6 (six) hours as needed (nausea).     senna-docusate 8.6-50 MG per tablet  Commonly known as:  Senokot-S  Take 1 tablet by mouth 2 (two) times daily.     simvastatin 80 MG tablet  Commonly known as:  ZOCOR  Take 80 mg by mouth at bedtime.     sorbitol 70 % solution  Take 15 mLs by mouth daily as needed (for severe constipation).         PHYSICAL EXAMINATION  Vitals: BP 139/74, HR 56, temp 97.5  Physical Exam  Nursing note and vitals reviewed. Constitutional: He is oriented to person, place, and time. He appears dehydrated. He appears unhealthy. He appears cachectic.  HENT:  Head: Normocephalic and atraumatic.  Mouth/Throat: Oropharynx is clear and moist. No oropharyngeal exudate.  Eyes: Conjunctivae and EOM are normal. Pupils are equal, round, and reactive to light. No scleral icterus.  Neck: Normal range of motion. Neck supple. No tracheal deviation present. No thyromegaly present.  Cardiovascular: Normal rate, regular  rhythm, normal heart sounds and intact distal pulses.  Exam reveals no friction rub.   No murmur heard. Pulmonary/Chest: Effort normal and breath sounds normal. No respiratory distress. He has no wheezes.  Abdominal: Soft. Bowel sounds are normal. He exhibits no distension. There is no tenderness. There is no rebound and no guarding.  Musculoskeletal: Normal range of motion. He exhibits no edema and no tenderness.  Lymphadenopathy:    He has no cervical adenopathy.  Neurological: He is alert and oriented to person, place, and time.  Skin: Skin is warm and dry. No rash noted. No erythema.  Psychiatric: Affect normal.    LABORATORY DATA:. CBC  Lab Results  Component Value Date   WBC 4.5 03/05/2014   RBC 3.92* 03/05/2014   HGB 11.1* 03/05/2014   HCT 33.4* 03/05/2014   PLT 89* 03/05/2014   MCV 85.2 03/05/2014   MCH 28.3 03/05/2014   MCHC 33.2  03/05/2014   RDW 13.7 03/05/2014   LYMPHSABS 1.1 03/05/2014   MONOABS 0.4 03/05/2014   EOSABS 0.1 03/05/2014   BASOSABS 0.0 03/05/2014     CMET  Lab Results  Component Value Date   NA 134* 03/05/2014   K 4.3 03/05/2014   CL 96 03/05/2014   CO2 25 03/05/2014   GLUCOSE 272* 03/05/2014   BUN 22 03/05/2014   CREATININE 1.26 03/05/2014   CALCIUM 9.0 03/05/2014   PROT 6.8 03/05/2014   ALBUMIN 3.5 03/05/2014   AST 16 03/05/2014   ALT 18 03/05/2014   ALKPHOS 72 03/05/2014   BILITOT 0.5 03/05/2014   GFRNONAA 53* 03/05/2014   GFRAA 62* 03/05/2014   ASSESSMENT/PLAN:    Diabetes mellitus type 2, uncontrolled  Assessment & Plan Patient has a history of chronic diabetes; which is typically followed by Dr. Ian Bushman primary care provider.  Patient has been taking Levemir 40 units every night before bedtime.  He states he has been awakening in the morning with a blood sugar as low as 40 at times.  Blood sugar while at the cancer Center today was 340.  Advised patient to decrease his 11 year down to the 20 units before bedtime this evening; and to followup  with Dr. Carlyle Lipa office first thing in the morning in regards to adjustment of his diabetes medications.  Also advised patient to check his blood sugar fairly often as well.   Malignant neoplasm of pancreas, part unspecified  Assessment & Plan Patient presented today to receive cycle 2 of his Folfirinox chemotherapy regimen.  His next cycle of  his chemotherapy will be due 03/14/2014.   Cancer associated pain  Assessment & Plan Patient continues to complain of occasionally uncontrolled generalized pain.  I did confirm the patient is taking MS Contin 60 mg twice daily; as well as Percocet for his breakthrough pain.  It may need to continue to adjust/increase the patient's pain medication for better control.   Nausea with vomiting  Assessment & Plan Patient is also complaining of nausea and vomiting for the past few days.  This is most likely a chemotherapy side effect. He feels fairly dehydrated.  Patient will be hydrated today with IV fluids while at the [facility].  Patient does already have anti--nausea medications at home to use on an as-needed basis.   Dehydration  Assessment & Plan Patient will receive 1 L normal saline IV fluid rehydration while at the cancer Center today.  Patient also plans to return to the [facility] on Friday, 03/02/2014 for additional IV fluid rehydration.   Diarrhea  Assessment & Plan Patient is also complaining of some intermittent diarrhea the colon and was advised to take Imodium on an as-needed basis.   Anorexia  Assessment & Plan Patient continues with minimal appetite; this is most likely a chemotherapy side effect.  Patient was encouraged to eat multiple small meals throughout the day if at all possible.   Weight loss  Assessment & Plan Patient continues to lose weight secondary to minimal appetite.  The patient was encouraged to push protein and to eat multiple small meals throughout the day.   Renal  insufficiency  Assessment & Plan Patient does have a history of chronic renal insufficiency; and his creatinine does remain a stable at 1.4.   Patient stated understanding of all instructions; and was in agreement with this plan of care. The patient knows to call the clinic with any problems, questions or concerns.   Review/collaboration with Dr. Benay Spice  regarding all aspects of patient's visit today.   Total time spent with patient was 25 minutes;  with greater than 75 percent of that time spent in face to face counseling regarding his symptoms, and coordination of care and follow up.  Disclaimer: This note was dictated with voice recognition software. Similar sounding words can inadvertently be transcribed and may not be corrected upon review.   Drue Second, NP 03/07/2014

## 2014-03-07 NOTE — Progress Notes (Signed)
IP PROGRESS NOTE  Subjective:  He reports overall improvement in the abdominal pain. This morning the pain has been increased. He just received a dose of IV Dilaudid. The Dilaudid tends to be effective for about one hour. He had a loose stool earlier today. He continues to have intermittent nausea/vomiting but overall this is improved as well. He takes Phenergan as needed.   Objective: Vital signs in last 24 hours: Blood pressure 118/85, pulse 52, temperature 98.6 F (37 C), temperature source Oral, resp. rate 18, height 6\' 1"  (1.854 m), weight 197 lb 1.5 oz (89.4 kg), SpO2 90.00%.  Intake/Output from previous day: 09/22 0701 - 09/23 0700 In: 2830 [P.O.:1000; I.V.:1830] Out: 1 [Emesis/NG output:1]  Physical Exam:  HEENT: No thrush Lungs: Clear bilaterally Cardiac: Regular rate and rhythm Abdomen: Soft, tender left lateral abdomen. No hepatomegaly. No mass. Extremities: No leg edema   Portacath/PICC-without erythema  Lab Results:  Recent Labs  03/05/14 1737  WBC 4.5  HGB 11.1*  HCT 33.4*  PLT 89*    BMET  Recent Labs  03/05/14 1737  NA 134*  K 4.3  CL 96  CO2 25  GLUCOSE 272*  BUN 22  CREATININE 1.26  CALCIUM 9.0    Studies/Results: No results found.  Medications: I have reviewed the patient's current medications.  Assessment/Plan: 1.Metastatic pancreas cancer, initial diagnosis May 2014, pancreas tail mass with liver metastases  Most recently treated with salvage FOLFOX chemotherapy, status post cycle 2 on 02/28/2014 2. Left abdominal pain-likely secondary to pancreas cancer. 3. Constipation secondary to chemotherapy and narcotic analgesics. Improved.  4. Diabetes  5. history of coronary artery disease  6. thrombocytopenia secondary to chemotherapy  7. anorexia/weight loss  8.  Nausea and vomiting-potentially related to narcotics versus pancreas cancer.   Overall improved control of the abdominal pain. The plan is to continue MS Contin and try  Roxanol for breakthrough pain. We will also try Phenergan orally for the nausea.  We discussed with Mr. Steenbergen and his wife that the pain he is experiencing is most likely due to progression of the pancreas cancer. Dr. Benay Spice recommends a referral to the [facility] program. They are in agreement.     LOS: 2 days   [facility] Card  Mr. Muckleroy appears improved form admission. His clinical status has worsened after 2 cycles of Folfox. I recommend Hospice care. He agrees to a [facility] referral. 03/07/2014, 9:17 AM

## 2014-03-07 NOTE — Assessment & Plan Note (Signed)
Patient continues to feel dehydrated; and received an additional 1 L normal saline IV fluid rehydration what the cancer Center today.

## 2014-03-07 NOTE — Progress Notes (Signed)
[place]   Chief Complaint  Patient presents with  . Follow-up    HPI: Christian Kaiser 79 y.o. male diagnosed with pancreatic cancer.  Currently undergoing Folfirinox chemotherapy regimen.  Patient presented today for discontinuation of his chemotherapy pump.  He is continues to feel dehydrated; we'll also received IV fluid rehydration while at the [facility].  Also, I patient confirmed that he has spoken with Dr. Felipa Eth patient's primary care provider in regards to her his diabetes medications.  He states he   was instructed to decrease his Levemir even further down to 10 units per night.  He states that his blood sugar was much better under control today. HPI  CURRENT THERAPY: Upcoming Treatment Dates - PANCREAS FOLFIRINOX q14d Days with orders from any treatment category:  03/14/2014      SCHEDULING COMMUNICATION      Dexamethasone Sodium Phosphate (DECADRON) injection 20 mg      fosaprepitant (EMEND) 150 mg in sodium chloride 0.9 % 145 mL IVPB      palonosetron (ALOXI) injection 0.25 mg      oxaliplatin (ELOXATIN) 145 mg in dextrose 5 % 500 mL chemo infusion      leucovorin 888 mg in dextrose 5 % 250 mL infusion      irinotecan (CAMPTOSAR) 400 mg in dextrose 5 % 500 mL chemo infusion      fluorouracil (ADRUCIL) 5,350 mg in sodium chloride 0.9 % 150 mL chemo infusion      atropine injection 0.5 mg      sodium chloride 0.9 % injection 10 mL      heparin lock flush 100 unit/mL      heparin lock flush 100 unit/mL      alteplase (CATHFLO ACTIVASE) injection 2 mg      sodium chloride 0.9 % injection 3 mL      Hot Pack 1 packet      Cold Pack 1 packet      dextrose 5 % solution      TREATMENT CONDITIONS 03/16/2014      SCHEDULING COMMUNICATION      sodium chloride 0.9 % injection 10 mL      heparin lock flush 100 unit/mL      heparin lock flush 100 unit/mL      alteplase (CATHFLO ACTIVASE) injection 2 mg      sodium chloride 0.9 % injection 3  mL 03/28/2014      SCHEDULING COMMUNICATION      Dexamethasone Sodium Phosphate (DECADRON) injection 20 mg      fosaprepitant (EMEND) 150 mg in sodium chloride 0.9 % 145 mL IVPB      palonosetron (ALOXI) injection 0.25 mg      oxaliplatin (ELOXATIN) 145 mg in dextrose 5 % 500 mL chemo infusion      leucovorin 888 mg in dextrose 5 % 250 mL infusion      irinotecan (CAMPTOSAR) 400 mg in dextrose 5 % 500 mL chemo infusion      fluorouracil (ADRUCIL) 5,350 mg in sodium chloride 0.9 % 150 mL chemo infusion      atropine injection 0.5 mg      sodium chloride 0.9 % injection 10 mL      heparin lock flush 100 unit/mL      heparin lock flush 100 unit/mL      alteplase (CATHFLO ACTIVASE) injection 2 mg      sodium chloride 0.9 % injection 3 mL      Hot Pack  1 packet      Cold Pack 1 packet      dextrose 5 % solution      TREATMENT CONDITIONS    ROS  Past Medical History  Diagnosis Date  . Diabetes mellitus     type 2  . Kidney stones   . HTN (hypertension)   . HLD (hyperlipidemia)   . CAD (coronary artery disease)     s/p 4v CABG 1990s and redo CABG 2006; cath 2007 showed patent grafts  . Carotid disease, bilateral     06/2010 carotid doppler <40% RICA stenosis and 96-78% LICA stenosis  . Gastric ulcer 2003  . Diverticulitis   . Anal fissure     s/p repair  . Shingles   . Cervical spondylosis   . Sacroiliitis   . Pancreas cancer 10/2012  . Bulging disc     Past Surgical History  Procedure Laterality Date  . Coronary artery bypass graft      1990s (SVG OM2/OM3, SVG to RCA, LIMA to LAD) and redo 2006 (reverse SVT to OM & distal LCx, reverse SVG to PDA, preservation of LIMA)  . Rotator cuff repair      Left  . Knee arthroscopy      Right    has Chest pain; Hypertension; ARF (acute renal failure); Hyperglycemia; Hyperlipidemia; Anemia; CAD (coronary artery disease); Morbid obesity; Diabetes mellitus type 2, uncontrolled; Thrombocytopenia; Malignant neoplasm of pancreas, part  unspecified; Cancer associated pain; Abdominal pain; Diabetes mellitus type 2, controlled; Nausea with vomiting; Pancreas cancer; Dehydration; Diarrhea; Anorexia; Weight loss; and Renal insufficiency on his problem list.     is allergic to altace.    Medication List       This list is accurate as of: 03/02/14 11:59 PM.  Always use your most recent med list.               aspirin EC 81 MG tablet  Take 81 mg by mouth daily.     bisacodyl 5 MG EC tablet  Commonly known as:  DULCOLAX  Take 5 mg by mouth daily as needed for mild constipation or moderate constipation.     dicyclomine 20 MG tablet  Commonly known as:  BENTYL  Take 1 tablet (20 mg total) by mouth 2 (two) times daily.     diphenhydramine-acetaminophen 25-500 MG Tabs  Commonly known as:  TYLENOL PM  Take 2 tablets by mouth at bedtime as needed (sleep).     Fish Oil 1200 MG Caps  Take 1,200 mg by mouth 2 (two) times daily.     glimepiride 4 MG tablet  Commonly known as:  AMARYL  Take 4 mg by mouth 2 (two) times daily.     HYDROcodone-acetaminophen 5-325 MG per tablet  Commonly known as:  NORCO/VICODIN  Take 1 tablet by mouth every 6 (six) hours as needed for moderate pain.     HYDROmorphone 2 MG tablet  Commonly known as:  DILAUDID  Take 1-2 tablets (2-4 mg total) by mouth every 4 (four) hours as needed for severe pain.     insulin detemir 100 UNIT/ML injection  Commonly known as:  LEVEMIR  Inject 40 Units into the skin at bedtime.     isosorbide mononitrate 60 MG 24 hr tablet  Commonly known as:  IMDUR  Take 1 tablet (60 mg total) by mouth every morning.     JUST TEARS EYE DROPS OP  Apply 1 drop to eye as needed (dry eyes).  lidocaine-prilocaine cream  Commonly known as:  EMLA  Apply topically as needed. apply over port 1-2 hours prior to chemotherapy.     LORazepam 0.5 MG tablet  Commonly known as:  ATIVAN  Take 1 tablet (0.5 mg total) by mouth every 8 (eight) hours as needed (PRN nausea).      metFORMIN 1000 MG tablet  Commonly known as:  GLUCOPHAGE  Take 1,000 mg by mouth 2 (two) times daily with a meal.     morphine 30 MG 12 hr tablet  Commonly known as:  MS CONTIN  Take 1 tablet (30 mg total) by mouth every 12 (twelve) hours.     nitroGLYCERIN 0.4 MG SL tablet  Commonly known as:  NITROSTAT  Place 0.4 mg under the tongue every 5 (five) minutes as needed for chest pain.     NOVOFINE 32G X 6 MM Misc  Generic drug:  Insulin Pen Needle     ONE TOUCH ULTRA TEST test strip  Generic drug:  glucose blood     onetouch ultrasoft lancets     oxyCODONE-acetaminophen 5-325 MG per tablet  Commonly known as:  PERCOCET/ROXICET  Take 1-2 tablets by mouth every 4 (four) hours as needed for severe pain.     prochlorperazine 10 MG tablet  Commonly known as:  COMPAZINE  Take 1 tablet (10 mg total) by mouth every 6 (six) hours as needed (nausea).     senna-docusate 8.6-50 MG per tablet  Commonly known as:  Senokot-S  Take 1 tablet by mouth 2 (two) times daily.     simvastatin 80 MG tablet  Commonly known as:  ZOCOR  Take 80 mg by mouth at bedtime.     sorbitol 70 % solution  Take 15 mLs by mouth daily as needed (for severe constipation).         PHYSICAL EXAMINATION  BP:  135/81, HR 73, temp 97.9  Physical Exam  Nursing note and vitals reviewed. Constitutional: He is oriented to person, place, and time. He appears dehydrated. He appears unhealthy. No distress.  HENT:  Head: Normocephalic.  Eyes: Pupils are equal, round, and reactive to light.  Neck: Normal range of motion.  Neurological: He is alert and oriented to person, place, and time.  Skin: Skin is warm and dry.  Psychiatric: Affect normal.   ASSESSMENT/PLAN:    Diabetes mellitus type 2, uncontrolled  Assessment & Plan Patient uncontrolled  blood sugars does appear to be doing much better.  Patient had initially decreased his Levemir from 40 units at bedtime down to 20 units.  Patient's primary care  provider Dr. Felipa Eth actually decreased the liver there down further to only 10 units per night.  Patient states his blood sugars have been much better since that time.  He will continue to follow up with his provider provider for further management of his diabetes.     Malignant neoplasm of pancreas, part unspecified  Assessment & Plan Patient here today for pump discontinuation of his Folfirinox chemotherapy pump.  He has plans to return on 03/15/1999 for his next cycle of chemotherapy.   Dehydration  Assessment & Plan Patient continues to feel dehydrated; and received an additional 1 L normal saline IV fluid rehydration what the cancer Center today.   Patient stated understanding of all instructions; and was in agreement with this plan of care. The patient knows to call the clinic with any problems, questions or concerns.   Review/collaboration with Dr. Benay Spice regarding all aspects of patient's visit today.  Total time spent with patient was 15 minutes;  with greater than 90 percent of that time spent in face to face counseling regarding his symptoms, review of his diabetic instructions, and coordination of care and follow up.  Disclaimer: This note was dictated with voice recognition software. Similar sounding words can inadvertently be transcribed and may not be corrected upon review.   Drue Second, NP 03/07/2014

## 2014-03-07 NOTE — Assessment & Plan Note (Signed)
Patient continues to lose weight secondary to minimal appetite.  The patient was encouraged to push protein and to eat multiple small meals throughout the day.

## 2014-03-07 NOTE — Progress Notes (Signed)
Received request from [facility] for hospice services once pt is discharge. Met with pt, wife and grandson at bedside. Pt and wife shared that their understanding was that the 'chemotherapy treatments would be continued only done at home' and did not hear that Dr Benay Spice was not recommending further treatments; Ms Dipasquale asked if other options or second opinions were available. Both pt and wife shared that they would like to talk again with Dr Benay Spice before agreeing to hospice; they informed that their daughter was arriving from [place] this afternoon and their son was here in town and they would like for them to also be able to be present for discussion with Dr Benay Spice. Writer informed Mr and Mrs Gurney that Dr Gearldine Shown nurse would be contacted with their request and that a note would also be placed in the chart to make team is aware of their concerns.   * [facility] referral on hold at this time per pt/family request and Liaison awaiting outcome of further discussion by Dr Benay Spice with family.  Above discussed with [facility] Alinda Sierras, staff RN Caitlyn  and a message left for Dr Gearldine Shown nurse.  Danton Sewer, RN MSN [facility] Liaison [phone]

## 2014-03-07 NOTE — Assessment & Plan Note (Signed)
Patient presented today to receive cycle 2 of his Folfirinox chemotherapy regimen.  His next cycle of  his chemotherapy will be due 03/14/2014.

## 2014-03-07 NOTE — Assessment & Plan Note (Signed)
Patient is also complaining of some intermittent diarrhea the colon and was advised to take Imodium on an as-needed basis.

## 2014-03-07 NOTE — Assessment & Plan Note (Signed)
Patient continues with minimal appetite; this is most likely a chemotherapy side effect.  Patient was encouraged to eat multiple small meals throughout the day if at all possible.

## 2014-03-07 NOTE — Assessment & Plan Note (Signed)
Patient will receive 1 L normal saline IV fluid rehydration while at the cancer Center today.  Patient also plans to return to the [facility] on Friday, 03/02/2014 for additional IV fluid rehydration.

## 2014-03-07 NOTE — Assessment & Plan Note (Signed)
Patient has a history of chronic diabetes; which is typically followed by Dr. Ian Bushman primary care provider.  Patient has been taking Levemir 40 units every night before bedtime.  He states he has been awakening in the morning with a blood sugar as low as 40 at times.  Blood sugar while at the cancer Center today was 340.  Advised patient to decrease his 11 year down to the 20 units before bedtime this evening; and to followup with Dr. Carlyle Lipa office first thing in the morning in regards to adjustment of his diabetes medications.  Also advised patient to check his blood sugar fairly often as well.

## 2014-03-08 ENCOUNTER — Encounter (HOSPITAL_COMMUNITY): Payer: Self-pay | Admitting: Radiology

## 2014-03-08 ENCOUNTER — Ambulatory Visit: Payer: Medicare Other | Admitting: Nurse Practitioner

## 2014-03-08 ENCOUNTER — Inpatient Hospital Stay (HOSPITAL_COMMUNITY): Payer: Medicare Other

## 2014-03-08 DIAGNOSIS — E43 Unspecified severe protein-calorie malnutrition: Secondary | ICD-10-CM | POA: Insufficient documentation

## 2014-03-08 LAB — URINALYSIS W MICROSCOPIC (NOT AT ARMC)
Bilirubin Urine: NEGATIVE
Glucose, UA: 100 mg/dL — AB
Hgb urine dipstick: NEGATIVE
Ketones, ur: NEGATIVE mg/dL
LEUKOCYTES UA: NEGATIVE
Nitrite: NEGATIVE
Protein, ur: NEGATIVE mg/dL
Specific Gravity, Urine: 1.019 (ref 1.005–1.030)
UROBILINOGEN UA: 0.2 mg/dL (ref 0.0–1.0)
pH: 5 (ref 5.0–8.0)

## 2014-03-08 LAB — GLUCOSE, CAPILLARY
Glucose-Capillary: 82 mg/dL (ref 70–99)
Glucose-Capillary: 181 mg/dL — ABNORMAL HIGH (ref 70–99)

## 2014-03-08 MED ORDER — IOHEXOL 300 MG/ML  SOLN
100.0000 mL | Freq: Once | INTRAMUSCULAR | Status: AC | PRN
  Administered 2014-03-08: 100 mL via INTRAVENOUS

## 2014-03-08 MED ORDER — MORPHINE SULFATE (CONCENTRATE) 10 MG /0.5 ML PO SOLN
20.0000 mg | ORAL | Status: DC | PRN
  Administered 2014-03-09 (×2): 20 mg via ORAL
  Filled 2014-03-08 (×2): qty 1
  Filled 2014-03-08: qty 2

## 2014-03-08 MED ORDER — HYDROMORPHONE HCL 1 MG/ML IJ SOLN
1.0000 mg | INTRAMUSCULAR | Status: DC | PRN
Start: 2014-03-08 — End: 2014-03-09
  Administered 2014-03-08 – 2014-03-09 (×3): 3 mg via INTRAVENOUS
  Filled 2014-03-08 (×3): qty 3

## 2014-03-08 MED ORDER — HYDROMORPHONE HCL 1 MG/ML IJ SOLN
1.0000 mg | INTRAMUSCULAR | Status: DC | PRN
  Administered 2014-03-08 (×3): 3 mg via INTRAVENOUS
  Filled 2014-03-08 (×3): qty 3

## 2014-03-08 NOTE — Progress Notes (Signed)
IP PROGRESS NOTE  Subjective:  He reports increased abdominal pain beginning during the night. The pain is now focused in the right lower abdomen and radiates into the right groin. No dysuria. The nausea has improved. The pain is relieved for approximately 1 hour after receiving IV Dilaudid.   Objective: Vital signs in last 24 hours: Blood pressure 100/52, pulse 64, temperature 98.4 F (36.9 C), temperature source Oral, resp. rate 20, height 6\' 1"  (1.854 m), weight 197 lb 1.5 oz (89.4 kg), SpO2 98.00%.  Intake/Output from previous day: 09/23 0701 - 09/24 0700 In: 28 [P.O.:120] Out: 802 [Urine:800; Stool:2]  Physical Exam:  HEENT: No thrush Lungs: Clear bilaterally Cardiac: Regular rate and rhythm Abdomen: Soft, tender in the right lower abdomen, no hepatomegaly, no mass, no tenderness at the testicles Extremities: No leg edema   Portacath/PICC-without erythema  Lab Results:  Recent Labs  03/05/14 1737  WBC 4.5  HGB 11.1*  HCT 33.4*  PLT 89*    BMET  Recent Labs  03/05/14 1737  NA 134*  K 4.3  CL 96  CO2 25  GLUCOSE 272*  BUN 22  CREATININE 1.26  CALCIUM 9.0    Studies/Results: No results found.  Medications: I have reviewed the patient's current medications.  Assessment/Plan: 1.Metastatic pancreas cancer, initial diagnosis May 2014, pancreas tail mass with liver metastases  Most recently treated with salvage FOLFOX chemotherapy, status post cycle 2 on 02/28/2014 2. Left abdominal pain-likely secondary to pancreas cancer. 3. Constipation secondary to chemotherapy and narcotic analgesics. Improved.  4. Diabetes  5. history of coronary artery disease  6. thrombocytopenia secondary to chemotherapy  7. anorexia/weight loss  8.  Nausea and vomiting-potentially related to narcotics versus pancreas cancer.    the pain has worsened today with a new pain in the right lower abdomen. I continue to feel the pain is most likely related to pancreas cancer, but  other etiologies are possible. He has a history of kidney stones. We decided to proceed with a CT of the abdomen and pelvis to further evaluate the pain. I increased the Dilaudid dose.  I had discussions with Mr. Salata and his family regarding the overall prognosis and treatment options. If the CT confirms progression of pancreas cancer I do not recommend further chemotherapy. We will place the [facility] referral on hold until after the CT scan.     LOS: 3 days   [place]   03/08/2014, 1:58 PM

## 2014-03-09 LAB — GLUCOSE, CAPILLARY
Glucose-Capillary: 170 mg/dL — ABNORMAL HIGH (ref 70–99)
Glucose-Capillary: 151 mg/dL — ABNORMAL HIGH (ref 70–99)
Glucose-Capillary: 280 mg/dL — ABNORMAL HIGH (ref 70–99)

## 2014-03-09 MED ORDER — HYDROMORPHONE HCL 1 MG/ML IJ SOLN: 1.0000 mg | INTRAMUSCULAR | Status: DC | PRN

## 2014-03-09 MED ORDER — MORPHINE SULFATE (CONCENTRATE) 10 MG /0.5 ML PO SOLN
20.0000 mg | ORAL | Status: DC | PRN
  Administered 2014-03-09 – 2014-03-10 (×3): 40 mg via ORAL
  Filled 2014-03-09: qty 2
  Filled 2014-03-09: qty 1
  Filled 2014-03-09: qty 2

## 2014-03-09 MED ORDER — MORPHINE SULFATE ER 100 MG PO TBCR
100.0000 mg | EXTENDED_RELEASE_TABLET | Freq: Two times a day (BID) | ORAL | Status: DC
  Administered 2014-03-09: 100 mg via ORAL
  Filled 2014-03-09 (×2): qty 1

## 2014-03-09 MED ORDER — PROMETHAZINE HCL 25 MG/ML IJ SOLN: 6.2500 mg | Freq: Four times a day (QID) | INTRAMUSCULAR | Status: DC | PRN

## 2014-03-09 MED ORDER — MORPHINE SULFATE ER 100 MG PO TBCR: 100.0000 mg | EXTENDED_RELEASE_TABLET | Freq: Two times a day (BID) | ORAL | Status: DC

## 2014-03-09 NOTE — Progress Notes (Signed)
Follow-up per Dr Gearldine Shown request to speak again with pt/family regarding hospice services at home. Met this morning with pt/wife [place] and daughter Jonelle Sidle; family voiced understanding no further chemotherapy is planned and plan is for home once pain is controlled. Discussed hospice services, philosophy and team approach to care with good understanding voiced by pt/family. Patient information reviewed with Dr Alferd Patee, [facility] Director hospice eligible with dx: Pancreatic Cancer Per discussion family understands Dr Benay Spice is considering discharge Saturday iif patient's pain is managed Pt stated he plans to d/c by personal vehicle *Please sign GOLD DNR Form in shadow chart to be sent home with pt at d/c.  *Dr Benay Spice please note as you are considering discharge medications: during this visit pt c/o R thigh/hip pain had received PRN 20 mg liquid Morphine and after 15 minutes pt was grimacing, writing in bed, rated pain 8-9/10, an additional 20 mg concentrated liquid Morphine  given by Maudie Mercury staff RN and within 20 minutes did report good effect - per last 3 doses in [facility]- pt has required 40 mg Liquid Morphine to be effective and it is unclear if the frequency (q 4 hr prn) may need to be increased  DME needs discussed pt/family requesting Complete Pkg C walker w wheels;3n1 BSC, Tub seat w/back, light weight wheelchair with footrest- pt declines a hospital bed at this time and has equipment list to discuss further with [facility] once home - msg left for [facility] equipment manager Gayland Curry to place order with [facility] and for delivery arrangements to contact wife [place] c: [phone] or h: [phone]   Attending physician with [facility] Dr Benay Spice ; pharmacy [facility] [place] (this pharmacy has liquid Morphine in stock) Initial paperwork faxed to [facility] *Completed d/c summary will need to be faxed to [facility] @ [phone] when final Please notify [facility] when patient is ready to leave unit  at d/c call [phone] on weekend  [facility] information and contact numbers also given to wife and daughter during visit.   Above information shared with Alinda Sierras [facility] Please call with any questions or concerns   Danton Sewer, RN MSN [facility] 03/09/2014, 11:36 AM [facility] Liaison [phone]

## 2014-03-09 NOTE — Discharge Instructions (Signed)
Call for increased pain

## 2014-03-09 NOTE — Care Management Note (Signed)
CARE MANAGEMENT NOTE 03/09/2014  Patient:  Christian Kaiser, Christian Kaiser   Account Number:  [id number]  Date Initiated:  03/06/2014  Documentation initiated by:  Marney Doctor  Subjective/Objective Assessment:   79 yo male admitted with abd pain.  Hx of metstatic pancreatic CA     Action/Plan:   From home with wife   Anticipated DC Date:  03/11/2014   Anticipated DC Plan:  [name]  CM consult      Choice offered to / List presented to:  C-1 Patient           Status of service:  In process, will continue to follow Medicare Important Message given?  YES (If response is "NO", the following Medicare IM given date fields will be blank) Date Medicare IM given:  03/09/2014 Medicare IM given by:  Marney Doctor Date Additional Medicare IM given:   Additional Medicare IM given by:    Discharge Disposition:    Per UR Regulation:  Reviewed for med. necessity/level of care/duration of stay  If discussed at [place] of Stay Meetings, dates discussed:    Comments:  03/09/14 Marney Doctor RN,BSN,NCM Pt to DC home with [facility].  03/07/14 Marney Doctor RN,BSN,NCM Met with pt and wife.  When asked about DC plan pt states that he is going home with Hospice.  Pt and wife chose [facility] when given choice. Christian Kaiser from [facility] given referral.  CM will continue to follow.  03/06/14 Marney Doctor RN,BSN,NCM Consult for CM for home hospice.  Per Dr Gearldine Shown note pt agreeable to [facility] referral.  I attempted to speak to pt and wife about home hospice, and pt states that he doesn't remember having that conversation with Dr Benay Spice.  Pts wife looked confused when I brought it up and I asked if they would like to talk more to Dr. Benay Spice about this and they stated that they would.  I asked the RN Christian Kaiser to call Dr. Benay Spice to have another conversation with pt and wife about home hospice.  Christian Kaiser states that he will have conversation this evening or tomorrow morning.  CM will  continue to follow.

## 2014-03-09 NOTE — Progress Notes (Addendum)
IP PROGRESS NOTE  Subjective:  His pain is under better control today. He appears more comfortable. No nausea.   Objective: Vital signs in last 24 hours: Blood pressure 132/79, pulse 82, temperature 99.2 F (37.3 C), temperature source Oral, resp. rate 16, height 6\' 1"  (1.854 m), weight 197 lb 1.5 oz (89.4 kg), SpO2 100.00%.  Intake/Output from previous day: 09/24 0701 - 09/25 0700 In: 2040 [P.O.:240; I.V.:1800] Out: 1225 [Urine:1225]  Physical Exam:  HEENT: No thrush Lungs: Clear bilaterally Cardiac: Regular rate and rhythm Abdomen: Soft mild tenderness at the right lower abdomen near the iliac bone, no mass Extremities: No leg edema Musculoskeletal: Mild tenderness over the anterior and right iliac, mild pain with internal rotation of the right hip Skin: No rash Neurologic: Good leg strength bilaterally   Portacath/PICC-without erythema   Studies/Results: Ct Abdomen Pelvis W Contrast  03/08/2014   CLINICAL DATA:  Right-sided abdominal pain. Nausea and vomiting. Pancreatic cancer with hepatic metastases.  EXAM: CT ABDOMEN AND PELVIS WITH CONTRAST  TECHNIQUE: Multidetector CT imaging of the abdomen and pelvis was performed using the standard protocol following bolus administration of intravenous contrast.  CONTRAST:  142mL OMNIPAQUE IOHEXOL 300 MG/ML  SOLN  COMPARISON:  CT of the abdomen and pelvis 02/12/2014.  FINDINGS: Lower chest: Cardiomegaly. Atherosclerotic calcifications in the left anterior descending, left circumflex and right coronary arteries. Postoperative changes of median sternotomy for CABG.  Hepatobiliary: Interval enlargement of 2 hypovascular lesions in the liver, compatible with progressive metastatic disease. The smaller lesion is in segment 3 of the liver (image 59 of series 2) measuring 21 x 16 mm. The largest lesion is centered predominantly in segment 5 (image 36 of series 2) measuring 3.6 x 4.0 cm. There is now some mild intrahepatic biliary ductal dilatation  distal to the lesion in segment 5 of the liver. In addition, there are 3 new lesions, 2 of which are in segment 7 of the liver (image 23 of series 6), measuring 1.4 and 1.2 cm in diameter, and 1 of which is in the central aspect of segment 4A (image 22 of series 6) measuring 2.3 x 1.7 cm.  Spleen: Unremarkable.  Pancreas: Known mass in the distal body and tail of the pancreas is similar to the most recent prior examination, heterogeneous in attenuation, with dystrophic internal calcifications and some internal cystic areas, overall measuring approximately 2.4 x 5.6 cm. No definite peripancreatic lymphadenopathy noted at this time. This lesion is in close proximity to the splenic artery, and splenic vein (which is poorly visualized), but is completely separate from the superior mesenteric artery and vein.  Stomach/Bowel: The appearance of the stomach is normal. No pathologic dilatation of the small bowel or colon. No focal bowel wall thickening or gross inflammatory changes are noted within the small bowel or colon. A few scattered colonic diverticulae are noted without surrounding inflammatory changes to suggest an acute diverticulitis at this time. Normal appendix.  Adrenals/urinary tract: Mild adreniform thickening of the adrenal glands bilaterally, similar to numerous prior examinations, favored to reflect adrenal hyperplasia. Sub cm low-attenuation lesion in the periphery of the upper pole of the left kidney is too small to characterize, but similar to prior examinations, favored to represent a small cyst. 2.6 cm low-attenuation lesion in the upper pole of the right kidney is unchanged, and continues to contain a focal area of mural calcification superiorly.  Vascular/Lymphatic: Atherosclerosis throughout the visualized vasculature, including ectasia of the descending thoracic aorta which measures up to 3.6 cm in diameter,  and mild fusiform infrarenal abdominal aortic aneurysmal dilatation, which measures up to  3.2 x 3.1 cm. No lymphadenopathy in the abdomen or pelvis.  Musculoskeletal: There are no aggressive appearing lytic or blastic lesions noted in the visualized portions of the skeleton. Median sternotomy wires.  Other: No significant volume of ascites.  No pneumoperitoneum.  IMPRESSION: 1. Today's study demonstrates progression of disease, with slight interval enlargement of the mass in the tail of the pancreas, interval enlargement of pre-existing hepatic metastases (including some developing postobstructive intrahepatic biliary ductal dilatation in segment 5 of the liver), as well as 3 new lesions in near the dome of the liver and segments 4A and 7, as detailed above. 2. Additional findings, as above, similar to prior studies.   Electronically Signed   By: Vinnie Langton M.D.   On: 03/08/2014 15:27    Medications: I have reviewed the patient's current medications.  Assessment/Plan: 1.Metastatic pancreas cancer, initial diagnosis May 2014, pancreas tail mass with liver metastases  Most recently treated with salvage FOLFOX chemotherapy, status post cycle 2 on 02/28/2014 CT 03/08/2014 revealed evidence of disease progression in the liver 2. Left abdominal pain-likely secondary to pancreas cancer. 3. Constipation secondary to chemotherapy and narcotic analgesics. Improved.  4. Diabetes  5. history of coronary artery disease  6. thrombocytopenia secondary to chemotherapy  7. anorexia/weight loss  8.  Nausea and vomiting-potentially related to narcotics versus pancreas cancer. Improved. 9. new onset right lobe nominal pain and pain with movement of the right upper leg 03/08/2014-no explanation for the pain on the abdomen/pelvic CT 03/08/2014, improved today   his pain appears to be under better control today. I discussed the CT findings with Christian Kaiser and his family last night and again this morning. The CT reveals evidence of disease progression in the liver. No clear explanation for the pain. I  suspect the pain is secondary to pancreas cancer, potentially with unrecognized carcinomatosis.  I recommend home Hospice care. He is in agreement. He will continue MS Contin and Roxanol for pain. We will increase the MS Contin dose .He will meet with the hospice nurse coordinator today.  He can be discharged to home over the weekend if his pain is under adequate control.   LOS: 4 days   Coralie Stanke, Dominica Severin   03/09/2014, 2:39 PM

## 2014-03-10 LAB — GLUCOSE, CAPILLARY: Glucose-Capillary: 141 mg/dL — ABNORMAL HIGH (ref 70–99)

## 2014-03-10 MED ORDER — HEPARIN SOD (PORK) LOCK FLUSH 100 UNIT/ML IV SOLN
500.0000 [IU] | Freq: Once | INTRAVENOUS | Status: DC
  Filled 2014-03-10: qty 5

## 2014-03-10 NOTE — Progress Notes (Signed)
SUBJECTIVE: Improvement in abdominal pain  OBJECTIVE PHYSICAL EXAMINATION: ECOG PERFORMANCE STATUS: 3 - Symptomatic, >50% confined to bed  Filed Vitals:   03/10/14 0730  BP: 167/87  Pulse: 76  Temp: 98.5 F (36.9 C)  Resp: 16   Filed Weights   03/05/14 1745  Weight: 197 lb 1.5 oz (89.4 kg)    GENERAL:alert, no distress and comfortable OROPHARYNX:no exudate, no erythema and lips, buccal mucosa, and tongue normal  NECK: supple, thyroid normal size, non-tender, without nodularity LYMPH:  no palpable lymphadenopathy in the cervical, axillary or inguinal LUNGS: clear to auscultation and percussion with normal breathing effort HEART: regular rate & rhythm and no murmurs and no lower extremity edema ABDOMEN:abdomen mild epigastric and RUQ tenderness Musculoskeletal:no cyanosis of digits and no clubbing  NEURO: alert & oriented x 3 with fluent speech, no focal motor/sensory deficits  LABORATORY DATA:  I have reviewed the data as listed @LASTCHEMISTRY @  Lab Results  Component Value Date   WBC 4.5 03/05/2014   HGB 11.1* 03/05/2014   HCT 33.4* 03/05/2014   MCV 85.2 03/05/2014   PLT 89* 03/05/2014   NEUTROABS 3.0 03/05/2014    ASSESSMENT AND PLAN: 1. Metastatic pancreatic cancer: Progressed on chemo. Hospice for home 2. Abdominal pain: Improved with MS Contin and Roxanol. 3. Discharge home today. Patient agrees. Nurses to inform Hospice.

## 2014-03-11 ENCOUNTER — Other Ambulatory Visit: Payer: Self-pay | Admitting: Oncology

## 2014-03-14 ENCOUNTER — Other Ambulatory Visit (HOSPITAL_BASED_OUTPATIENT_CLINIC_OR_DEPARTMENT_OTHER): Payer: Medicare Other

## 2014-03-14 ENCOUNTER — Ambulatory Visit: Payer: Medicare Other

## 2014-03-14 ENCOUNTER — Inpatient Hospital Stay: Payer: Medicare Other

## 2014-03-14 ENCOUNTER — Telehealth: Payer: Self-pay | Admitting: Oncology

## 2014-03-14 ENCOUNTER — Ambulatory Visit (HOSPITAL_BASED_OUTPATIENT_CLINIC_OR_DEPARTMENT_OTHER): Admitting: Nurse Practitioner

## 2014-03-14 ENCOUNTER — Other Ambulatory Visit: Payer: Self-pay | Admitting: *Deleted

## 2014-03-14 VITALS — BP 147/73 | HR 67 | Temp 98.2°F | Resp 20 | Ht 73.0 in | Wt 209.0 lb

## 2014-03-14 DIAGNOSIS — C787 Secondary malignant neoplasm of liver and intrahepatic bile duct: Secondary | ICD-10-CM

## 2014-03-14 DIAGNOSIS — Z95828 Presence of other vascular implants and grafts: Secondary | ICD-10-CM

## 2014-03-14 DIAGNOSIS — C252 Malignant neoplasm of tail of pancreas: Secondary | ICD-10-CM

## 2014-03-14 DIAGNOSIS — Z452 Encounter for adjustment and management of vascular access device: Secondary | ICD-10-CM

## 2014-03-14 DIAGNOSIS — C259 Malignant neoplasm of pancreas, unspecified: Secondary | ICD-10-CM

## 2014-03-14 LAB — CBC WITH DIFFERENTIAL/PLATELET
BASO%: 0.3 % (ref 0.0–2.0)
BASOS ABS: 0 10*3/uL (ref 0.0–0.1)
EOS%: 1.2 % (ref 0.0–7.0)
Eosinophils Absolute: 0 10*3/uL (ref 0.0–0.5)
HCT: 29.5 % — ABNORMAL LOW (ref 38.4–49.9)
HGB: 9.6 g/dL — ABNORMAL LOW (ref 13.0–17.1)
LYMPH%: 23 % (ref 14.0–49.0)
MCH: 28.4 pg (ref 27.2–33.4)
MCHC: 32.5 g/dL (ref 32.0–36.0)
MCV: 87.3 fL (ref 79.3–98.0)
MONO#: 0.3 10*3/uL (ref 0.1–0.9)
MONO%: 8.1 % (ref 0.0–14.0)
NEUT#: 2.3 10*3/uL (ref 1.5–6.5)
NEUT%: 67.4 % (ref 39.0–75.0)
Platelets: 87 10*3/uL — ABNORMAL LOW (ref 140–400)
RBC: 3.38 10*6/uL — ABNORMAL LOW (ref 4.20–5.82)
RDW: 14.3 % (ref 11.0–14.6)
WBC: 3.4 10*3/uL — ABNORMAL LOW (ref 4.0–10.3)
lymph#: 0.8 10*3/uL — ABNORMAL LOW (ref 0.9–3.3)

## 2014-03-14 LAB — COMPREHENSIVE METABOLIC PANEL (CC13)
ALK PHOS: 78 U/L (ref 40–150)
ALT: 20 U/L (ref 0–55)
AST: 20 U/L (ref 5–34)
Albumin: 3 g/dL — ABNORMAL LOW (ref 3.5–5.0)
Anion Gap: 8 mEq/L (ref 3–11)
BILIRUBIN TOTAL: 0.42 mg/dL (ref 0.20–1.20)
BUN: 9.8 mg/dL (ref 7.0–26.0)
CO2: 25 mEq/L (ref 22–29)
Calcium: 9.1 mg/dL (ref 8.4–10.4)
Chloride: 113 mEq/L — ABNORMAL HIGH (ref 98–109)
Creatinine: 1.2 mg/dL (ref 0.7–1.3)
Glucose: 179 mg/dl — ABNORMAL HIGH (ref 70–140)
POTASSIUM: 3.6 meq/L (ref 3.5–5.1)
Sodium: 146 mEq/L — ABNORMAL HIGH (ref 136–145)
Total Protein: 6.2 g/dL — ABNORMAL LOW (ref 6.4–8.3)

## 2014-03-14 MED ORDER — SODIUM CHLORIDE 0.9 % IJ SOLN
10.0000 mL | INTRAMUSCULAR | Status: DC | PRN
  Administered 2014-03-14: 10 mL via INTRAVENOUS
  Filled 2014-03-14: qty 10

## 2014-03-14 MED ORDER — HEPARIN SOD (PORK) LOCK FLUSH 100 UNIT/ML IV SOLN
500.0000 [IU] | Freq: Once | INTRAVENOUS | Status: AC
  Administered 2014-03-14: 500 [IU] via INTRAVENOUS
  Filled 2014-03-14: qty 5

## 2014-03-14 NOTE — Telephone Encounter (Signed)
, °

## 2014-03-14 NOTE — Progress Notes (Addendum)
[facility] OFFICE PROGRESS NOTE   Diagnosis:  Pancreas cancer  INTERVAL HISTORY:   Christian Kaiser returns as scheduled. He completed cycle 2 FOLFOX on 02/28/2014. He was hospitalized on 03/05/2014 with left-sided abdominal pain. During the hospitalization he developed severe right lower abdominal pain. CT of the abdomen/pelvis on 03/08/2014 showed slight interval enlargement of the mass in the tail of the pancreas, enlargement of pre-existing hepatic metastases and 3 new lesions near the dome of the liver. There was no explanation for the right-sided abdominal pain. Pain medications were adjusted. The MS Contin dose was increased to 100 mg every 12 hours. His pain slightly improved. He was discharged home with hospice support on 03/10/2014.  He reports his pain is well-controlled. He has no pain at present. He continues MS Contin 100 mg every 12 hours. He rarely takes breakthrough pain medication. He denies nausea/vomiting. Bowels are moving. Appetite is "pretty good". Blood sugars are well controlled. He is holding the nighttime dose of insulin due to some low morning readings.  Objective:  Vital signs in last 24 hours:  Blood pressure 147/73, pulse 67, temperature 98.2 F (36.8 C), temperature source Oral, resp. rate 20, height 6\' 1"  (1.854 m), weight 209 lb (94.802 kg).    HEENT: No thrush or ulcers. Resp: Lungs clear bilaterally. Cardio: Regular rate and rhythm. GI: Abdomen is soft. Nondistended. Mild tenderness at the left lateral upper quadrant. No mass. No organomegaly. Vascular: Trace lower leg edema bilaterally. Calves soft and nontender.  Port-A-Cath without erythema.    Lab Results:  Lab Results  Component Value Date   WBC 3.4* 03/14/2014   HGB 9.6* 03/14/2014   HCT 29.5* 03/14/2014   MCV 87.3 03/14/2014   PLT 87* 03/14/2014   NEUTROABS 2.3 03/14/2014    Imaging:  No results found.  Medications: I have reviewed the patient's current  medications.  Assessment/Plan: 1. Adenocarcinoma of the pancreas status post ultrasound-guided biopsy of a liver lesion 10/18/2012 consistent with metastatic pancreas cancer. CT 10/11/2012 revealed a pancreas tail mass and liver metastases. CA 19-9 in normal range on 10/24/2012. Initiation of gemcitabine/Abraxane on a day 1, day 8 day 15 schedule on 10/28/2012. Current schedule is day one/day 8.  Restaging CT 01/26/2013 revealed a decrease in the size of liver metastases and no new lesions  Gemcitabine/Abraxane continued on a day 1, day 8 schedule.  Restaging CT 04/25/2013 with a decrease in the size of the pancreas mass and liver lesions.  Gemcitabine/Abraxane continued on a day 1, day 8 schedule.  Restaging CT 08/08/2013-further regression of the pancreas mass, stable liver lesions.  Gemcitabine/Abraxane continued on a day 1, day 8 schedule.  Gemcitabine/Abraxane changed to an every 2 week schedule beginning 10/11/2013.  Restaging CT abdomen/pelvis 12/26/2013 with interval enlargement of a peripherally enhancing lesion in the right hepatic lobe; stable lesion left hepatic lobe; interval mild progression of the mass involving the pancreatic tail.  Cycle 1 FOLFOX 02/07/2014. Cycle 2 FOLFOX 02/28/2014. CT abdomen/pelvis 03/08/2014 showed slight interval enlargement of the mass in the tail of the pancreas, enlargement of pre-existing hepatic metastases and 3 new lesions in the dome of the liver. 2. Abdominal pain secondary to the pancreas mass.  3. Anorexia/weight loss. He has again developed anorexia. 4. Diabetes. Exacerbated by steroid premedication. Decadron was reduced to 5 mg after cycle 1 day 1. 5. History of coronary artery disease. 6. Headache following cycle 1 day 1 gemcitabine/Abraxane. Question related to Zofran.  7. History of thrombocytopenia secondary to chemotherapy.  8. 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 Question secondary to Abraxane versus  diabetes.  9. Depression-he declined an antidepressant. Counseling services were offered as well. 10. Thrombocytopenia secondary to chemotherapy. 11. MRI lumbar spine 02/14/2014 with disc protrusion L5-S1 with foraminal encroachment bilaterally right greater than left.  12. Hospitalization 03/05/2014 through 03/10/2014 for pain control.    Disposition: Christian Kaiser appears improved. His pain is well controlled on the current regimen.  He is enrolled in the [facility] hospice program. Plan to continue to follow on a supportive/comfort care approach.  He will return for a followup visit in 3 weeks. He will contact the office in the interim with any problems.  Patient seen with Dr. Benay Spice.    Ned Card ANP/GNP-BC   03/14/2014  10:09 AM  This was a shared visit with Ned Card.  He appears much more comfortable today. The plan is to continue MS Contin at the current dose.  The severe acute right low abdomen/pelvic pain has resolved. It is possible this was related to an acute event such as a renal stone.  [name], M.D.

## 2014-03-14 NOTE — Patient Instructions (Signed)

## 2014-04-04 ENCOUNTER — Other Ambulatory Visit: Payer: Self-pay | Admitting: *Deleted

## 2014-04-04 ENCOUNTER — Telehealth: Payer: Self-pay | Admitting: Oncology

## 2014-04-04 ENCOUNTER — Ambulatory Visit (HOSPITAL_BASED_OUTPATIENT_CLINIC_OR_DEPARTMENT_OTHER): Payer: Medicare Other | Admitting: Oncology

## 2014-04-04 VITALS — BP 138/69 | HR 66 | Temp 98.4°F | Resp 18 | Ht 73.0 in | Wt 193.3 lb

## 2014-04-04 DIAGNOSIS — R630 Anorexia: Secondary | ICD-10-CM

## 2014-04-04 DIAGNOSIS — R112 Nausea with vomiting, unspecified: Secondary | ICD-10-CM

## 2014-04-04 DIAGNOSIS — D6959 Other secondary thrombocytopenia: Secondary | ICD-10-CM

## 2014-04-04 DIAGNOSIS — E119 Type 2 diabetes mellitus without complications: Secondary | ICD-10-CM

## 2014-04-04 DIAGNOSIS — C252 Malignant neoplasm of tail of pancreas: Secondary | ICD-10-CM

## 2014-04-04 DIAGNOSIS — C787 Secondary malignant neoplasm of liver and intrahepatic bile duct: Secondary | ICD-10-CM

## 2014-04-04 DIAGNOSIS — C259 Malignant neoplasm of pancreas, unspecified: Secondary | ICD-10-CM

## 2014-04-04 MED ORDER — ONDANSETRON HCL 8 MG PO TABS: 8.0000 mg | ORAL_TABLET | Freq: Four times a day (QID) | ORAL | Status: DC

## 2014-04-04 MED ORDER — ONDANSETRON HCL 8 MG PO TABS: 8.0000 mg | ORAL_TABLET | Freq: Three times a day (TID) | ORAL | Status: DC | PRN

## 2014-04-04 NOTE — Progress Notes (Signed)
Christian Kaiser OFFICE PROGRESS NOTE   Diagnosis: Pancreas cancer  INTERVAL HISTORY:   Christian Kaiser returns as scheduled. He is enrolled in the [facility] program. He reports improved abdominal pain. He takes Roxanol and frequently for left upper abdominal pain. His bowels are moving. He complains of increased nausea. The nausea occurs after meals with associated emesis. He can tolerate water. Ensure and boost are associated with nausea. He is no longer taking insulin.  Objective:  Vital signs in last 24 hours:  Blood pressure 138/69, pulse 66, temperature 98.4 F (36.9 C), temperature source Oral, resp. rate 18, height 6\' 1"  (1.854 m), weight 193 lb 4.8 oz (87.68 kg).    HEENT: No thrush or ulcers Resp: Lungs clear bilaterally Cardio: Regular rate and rhythm GI: No hepatomegaly, no mass, mild tenderness in the mid and left upper and Vascular: No leg edema   Portacath/PICC-without erythema   Medications: I have reviewed the patient's current medications.  Assessment/Plan: 1. Adenocarcinoma of the pancreas status post ultrasound-guided biopsy of a liver lesion 10/18/2012 consistent with metastatic pancreas cancer. CT 10/11/2012 revealed a pancreas tail mass and liver metastases. CA 19-9 in normal range on 10/24/2012. Initiation of gemcitabine/Abraxane on a day 1, day 8 day 15 schedule on 10/28/2012. Current schedule is day one/day 8.  Restaging CT 01/26/2013 revealed a decrease in the size of liver metastases and no new lesions  Gemcitabine/Abraxane continued on a day 1, day 8 schedule.  Restaging CT 04/25/2013 with a decrease in the size of the pancreas mass and liver lesions.  Gemcitabine/Abraxane continued on a day 1, day 8 schedule.  Restaging CT 08/08/2013-further regression of the pancreas mass, stable liver lesions.  Gemcitabine/Abraxane continued on a day 1, day 8 schedule.  Gemcitabine/Abraxane changed to an every 2 week schedule beginning 10/11/2013.    Restaging CT abdomen/pelvis 12/26/2013 with interval enlargement of a peripherally enhancing lesion in the right hepatic lobe; stable lesion left hepatic lobe; interval mild progression of the mass involving the pancreatic tail.  Cycle 1 FOLFOX 02/07/2014.  Cycle 2 FOLFOX 02/28/2014.  CT abdomen/pelvis 03/08/2014 showed slight interval enlargement of the mass in the tail of the pancreas, enlargement of pre-existing hepatic metastases and 3 new lesions in the dome of the liver. 2. Abdominal pain secondary to the pancreas mass.  3. Anorexia/weight loss. He has again developed anorexia. 4. Diabetes. Exacerbated by steroid premedication. Decadron was reduced to 5 mg after cycle 1 day 1. 5. History of coronary artery disease. 6. Headache following cycle 1 day 1 gemcitabine/Abraxane. Question related to Zofran.  7. History of thrombocytopenia secondary to chemotherapy. 8. 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 Question secondary to Abraxane versus diabetes.  9. Depression-he declined an antidepressant. Counseling services were offered as well. 10. Thrombocytopenia secondary to chemotherapy. 11. MRI lumbar spine 02/14/2014 with disc protrusion L5-S1 with foraminal encroachment bilaterally right greater than left.  12. Hospitalization 03/05/2014 through 03/10/2014 for pain control. 13. Nausea and vomiting  Disposition:  Mr. Pak has metastatic pancreas cancer. His pain is under good control with MS Contin. He has increased nausea and vomiting. This may be related to the pancreas mass or liver metastases. It is also possible the nausea is in part secondary to morphine. He will try Zofran for nausea. If this does not help we will consider adding a low-dose steroid and decreasing the MS Contin dose.   He will return for an office visit in 2 weeks. [name], MD  04/04/2014  11:49 AM

## 2014-04-04 NOTE — Telephone Encounter (Signed)
Call from wife requesting Zofran Rx to be sent to [facility]- this way it'll be covered by hospice. Same done.

## 2014-04-04 NOTE — Telephone Encounter (Signed)
gv adn printed appt sched and avs for pt for NOV °

## 2014-04-05 NOTE — Discharge Summary (Signed)
Physician Discharge Summary  Patient ID: Christian Kaiser @ATTENDINGNPI @ MRN: [id number] DOB/AGE: 1935/01/25 79 y.o.  Admit date: 03/05/2014 Discharge date: 03/10/2014  Discharge Diagnoses:  1. Pancreas cancer 2. abdominal pain secondary to #1 3. Diabetes 4. history of coronary artery disease 5. nausea and vomiting  Discharged Condition: Improved  Discharge Labs:  none  Significant Diagnostic Studies: CT abdomen and pelvis  Consults: None    Disposition: 01-Home or Self Care     Medication List    STOP taking these medications       dicyclomine 20 MG tablet  Commonly known as:  BENTYL     Fish Oil 1200 MG Caps     simvastatin 80 MG tablet  Commonly known as:  ZOCOR      TAKE these medications       aspirin EC 81 MG tablet  Take 81 mg by mouth daily.     bisacodyl 5 MG EC tablet  Commonly known as:  DULCOLAX  Take 5 mg by mouth daily as needed for mild constipation or moderate constipation.     diphenhydramine-acetaminophen 25-500 MG Tabs  Commonly known as:  TYLENOL PM  Take 2 tablets by mouth at bedtime as needed (sleep).     glimepiride 4 MG tablet  Commonly known as:  AMARYL  Take 4 mg by mouth 2 (two) times daily.     insulin detemir 100 UNIT/ML injection  Commonly known as:  LEVEMIR  Inject 40 Units into the skin at bedtime.     isosorbide mononitrate 60 MG 24 hr tablet  Commonly known as:  IMDUR  Take 1 tablet (60 mg total) by mouth every morning.     JUST TEARS EYE DROPS OP  Apply 1 drop to eye as needed (dry eyes).     lidocaine-prilocaine cream  Commonly known as:  EMLA  Apply topically as needed. apply over port 1-2 hours prior to chemotherapy.     LORazepam 0.5 MG tablet  Commonly known as:  ATIVAN  Take 1 tablet (0.5 mg total) by mouth every 8 (eight) hours as needed (PRN nausea).     metFORMIN 1000 MG tablet  Commonly known as:  GLUCOPHAGE  Take 1,000 mg by mouth 2 (two) times daily with a meal.     morphine 20 MG/ML  concentrated solution  Commonly known as:  ROXANOL  Take 1-2 ml every 4 hours as needed     morphine 100 MG 12 hr tablet  Commonly known as:  MS CONTIN  Take 1 tablet (100 mg total) by mouth every 12 (twelve) hours.     nitroGLYCERIN 0.4 MG SL tablet  Commonly known as:  NITROSTAT  Place 0.4 mg under the tongue every 5 (five) minutes as needed for chest pain.     NOVOFINE 32G X 6 MM Misc  Generic drug:  Insulin Pen Needle     ONE TOUCH ULTRA TEST test strip  Generic drug:  glucose blood     onetouch ultrasoft lancets     prochlorperazine 10 MG tablet  Commonly known as:  COMPAZINE  Take 1 tablet (10 mg total) by mouth every 6 (six) hours as needed (nausea).     senna-docusate 8.6-50 MG per tablet  Commonly known as:  Senokot-S  Take 1 tablet by mouth 2 (two) times daily.     sorbitol 70 % solution  Take 15 mLs by mouth daily as needed (for severe constipation).  Follow-up Information   Follow up with [name], MD. (03/14/2014 as scheduled)    Specialty:  Oncology   Contact information:   [place] [address] [phone]       Hospital Course: He was admitted 03/05/2014 with abdominal pain and nausea/vomiting. A  CT 03/08/2014 revealed no explanation for the pain, but there was evidence of disease progression in the liver.  His pain and nausea improved during this hospital admission. I had extensive discussions with Christian Kaiser and his family regarding her prognosis and treatment options. I do not recommend further chemotherapy. He was referred to the [facility] program for home care.  He was continued on MS Contin and Roxanol for pain. He appeared stable for discharge 03/10/2014       Signed: [name], MD 04/05/2014, 6:16 PM

## 2014-04-20 ENCOUNTER — Telehealth: Payer: Self-pay | Admitting: Oncology

## 2014-04-20 ENCOUNTER — Ambulatory Visit (HOSPITAL_BASED_OUTPATIENT_CLINIC_OR_DEPARTMENT_OTHER): Payer: Medicare Other | Admitting: Oncology

## 2014-04-20 ENCOUNTER — Ambulatory Visit (HOSPITAL_BASED_OUTPATIENT_CLINIC_OR_DEPARTMENT_OTHER)

## 2014-04-20 VITALS — BP 127/86 | HR 75 | Temp 97.9°F | Resp 18 | Ht 73.0 in | Wt 189.4 lb

## 2014-04-20 DIAGNOSIS — E119 Type 2 diabetes mellitus without complications: Secondary | ICD-10-CM

## 2014-04-20 DIAGNOSIS — R634 Abnormal weight loss: Secondary | ICD-10-CM

## 2014-04-20 DIAGNOSIS — R630 Anorexia: Secondary | ICD-10-CM

## 2014-04-20 DIAGNOSIS — R110 Nausea: Secondary | ICD-10-CM

## 2014-04-20 DIAGNOSIS — C259 Malignant neoplasm of pancreas, unspecified: Secondary | ICD-10-CM

## 2014-04-20 DIAGNOSIS — C251 Malignant neoplasm of body of pancreas: Secondary | ICD-10-CM

## 2014-04-20 DIAGNOSIS — C787 Secondary malignant neoplasm of liver and intrahepatic bile duct: Secondary | ICD-10-CM

## 2014-04-20 DIAGNOSIS — C252 Malignant neoplasm of tail of pancreas: Secondary | ICD-10-CM

## 2014-04-20 DIAGNOSIS — F329 Major depressive disorder, single episode, unspecified: Secondary | ICD-10-CM

## 2014-04-20 DIAGNOSIS — R1012 Left upper quadrant pain: Secondary | ICD-10-CM

## 2014-04-20 MED ORDER — SODIUM CHLORIDE 0.9 % IJ SOLN
10.0000 mL | INTRAMUSCULAR | Status: DC | PRN
  Administered 2014-04-20: 10 mL via INTRAVENOUS
  Filled 2014-04-20: qty 10

## 2014-04-20 MED ORDER — HEPARIN SOD (PORK) LOCK FLUSH 100 UNIT/ML IV SOLN
500.0000 [IU] | Freq: Once | INTRAVENOUS | Status: AC
  Administered 2014-04-20: 500 [IU] via INTRAVENOUS
  Filled 2014-04-20: qty 5

## 2014-04-20 NOTE — Telephone Encounter (Signed)
gv adn printed appt sched and avs for pt for Dec °

## 2014-04-20 NOTE — Progress Notes (Signed)
  [name]   Diagnosis: pancreas cancer  INTERVAL HISTORY:   He returns as scheduled.the left upper abdominal pain is controlled with MS Contin. He no longer has pain in the right pelvic area. He continues to have nausea. The nausea improved with Haldol. Mr. Colee is no longer taking insulin.  Objective:  Vital signs in last 24 hours:  Blood pressure 127/86, pulse 75, temperature 97.9 F (36.6 C), temperature source Oral, resp. rate 18, height 6\' 1"  (1.854 m), weight 189 lb 6.4 oz (85.911 kg).    HEENT: no thrush Resp: lungs clear bilaterally Cardio: regular rate and rhythm GI: no hepatomegaly, nontender, no mass Vascular: no leg edema   Portacath/PICC-without erythema  Medications: I have reviewed the patient's current medications.  Assessment/Plan: 1. Adenocarcinoma of the pancreas status post ultrasound-guided biopsy of a liver lesion 10/18/2012 consistent with metastatic pancreas cancer. CT 10/11/2012 revealed a pancreas tail mass and liver metastases. CA 19-9 in normal range on 10/24/2012. Initiation of gemcitabine/Abraxane on a day 1, day 8 day 15 schedule on 10/28/2012. Current schedule is day one/day 8.  Restaging CT 01/26/2013 revealed a decrease in the size of liver metastases and no new lesions   Gemcitabine/Abraxane continued on a day 1, day 8 schedule.   Restaging CT 04/25/2013 with a decrease in the size of the pancreas mass and liver lesions.   Gemcitabine/Abraxane continued on a day 1, day 8 schedule.   Restaging CT 08/08/2013-further regression of the pancreas mass, stable liver lesions.   Gemcitabine/Abraxane continued on a day 1, day 8 schedule.   Gemcitabine/Abraxane changed to an every 2 week schedule beginning 10/11/2013.   Restaging CT abdomen/pelvis 12/26/2013 with interval enlargement of a peripherally enhancing lesion in the right hepatic lobe; stable lesion left hepatic lobe; interval mild  progression of the mass involving the pancreatic tail.   Cycle 1 FOLFOX 02/07/2014.   Cycle 2 FOLFOX 02/28/2014.   CT abdomen/pelvis 03/08/2014 showed slight interval enlargement of the mass in the tail of the pancreas, enlargement of pre-existing hepatic metastases and 3 new lesions in the dome of the liver. 2. Abdominal pain secondary to the pancreas mass.  3. Anorexia/weight loss. He has again developed anorexia. 4. Diabetes. Exacerbated by steroid premedication. Decadron was reduced to 5 mg after cycle 1 day 1. 5. History of coronary artery disease. 6. Headache following cycle 1 day 1 gemcitabine/Abraxane. Question related to Zofran.  7. History of thrombocytopenia secondary to chemotherapy. 8. 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 Question secondary to Abraxane versus diabetes.  9. Depression-he declined an antidepressant. Counseling services were offered as well. 10. History ofThrombocytopenia secondary to chemotherapy. 11. MRI lumbar spine 02/14/2014 with disc protrusion L5-S1 with foraminal encroachment bilaterally right greater than left.  12. Hospitalization 03/05/2014 through 03/10/2014 for pain control. 13. Nausea -improved with Haldol   Disposition: Christian Kaiser has metastatic pancreas cancer. He is followed by the [facility] program. His pain is under good control and the nausea is improved with Haldol. He will continue the current medical regimen. He is scheduled to see Dr. Felipa Eth later today. We flushed the Port-A-Cath today.  He will return for an office visit and Port-A-Cath flush in one month. Christian Coder, MD  04/20/2014  11:33 AM

## 2014-04-20 NOTE — Patient Instructions (Signed)
Christian Kaiser Discharge Instructions  RECOMMENDATIONS MADE BY THE CONSULTANT AND ANY TEST RESULTS WILL BE SENT TO YOUR REFERRING PHYSICIAN.  EXAM FINDINGS BY THE PHYSICIAN TODAY AND SIGNS OR SYMPTOMS TO REPORT TO CLINIC OR PRIMARY PHYSICIAN  MEDICATIONS PRESCRIBED:    INSTRUCTIONS GIVEN AND DISCUSSED: PAC flush today after visit    SPECIAL INSTRUCTIONS/FOLLOW-UP:   Thank you for choosing [facility] to provide your oncology and hematology care.  To afford each patient quality time with our providers, please arrive at least 30 minutes before your scheduled appointment time.  With your help, our goal is to use those 30 minutes to complete the necessary work-up to ensure our physicians have the information they need to help with your evaluation and healthcare recommendations.     ___________________  Should you have questions after your visit to [facility], please contact our office at [phone] between the hours of 8:30 a.m. and 4:30 p.m.  Voicemails left after 4:00 p.m. will not be returned until the following business day.  For prescription refill requests, have your pharmacy contact our office with your prescription refill request. We request 24 hour notice for all refill requests.

## 2014-04-23 ENCOUNTER — Telehealth: Payer: Self-pay | Admitting: Oncology

## 2014-04-23 NOTE — Telephone Encounter (Signed)
, °

## 2014-04-27 ENCOUNTER — Ambulatory Visit (INDEPENDENT_AMBULATORY_CARE_PROVIDER_SITE_OTHER): Payer: Medicare Other | Admitting: Interventional Cardiology

## 2014-04-27 ENCOUNTER — Encounter: Payer: Self-pay | Admitting: Interventional Cardiology

## 2014-04-27 VITALS — BP 127/60 | HR 80 | Ht 73.2 in | Wt 184.0 lb

## 2014-04-27 DIAGNOSIS — I25810 Atherosclerosis of coronary artery bypass graft(s) without angina pectoris: Secondary | ICD-10-CM

## 2014-04-27 DIAGNOSIS — E118 Type 2 diabetes mellitus with unspecified complications: Secondary | ICD-10-CM

## 2014-04-27 DIAGNOSIS — I5042 Chronic combined systolic (congestive) and diastolic (congestive) heart failure: Secondary | ICD-10-CM | POA: Insufficient documentation

## 2014-04-27 DIAGNOSIS — I10 Essential (primary) hypertension: Secondary | ICD-10-CM

## 2014-04-27 DIAGNOSIS — C259 Malignant neoplasm of pancreas, unspecified: Secondary | ICD-10-CM

## 2014-04-27 DIAGNOSIS — E785 Hyperlipidemia, unspecified: Secondary | ICD-10-CM

## 2014-04-27 NOTE — Progress Notes (Signed)
Patient ID: Christian Kaiser, male   DOB: 1935/06/01, 79 y.o.   MRN: [id number]    [address] [place], [place]  [address] Phone: [phone] Fax:  [phone]  Date:  04/27/2014   ID:  MJ WILLIS, DOB 09-Jun-1935, MRN [id number]  PCP:  Mathews Argyle, MD   ASSESSMENT:  1. 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 Coronary artery disease with prior coronary bypass grafting 2, stable with angina that is intermittent and short lived 3. Hypertension, essential, controlled 4. Carotid artery disease, asymptomatic 5. Pancreatic CEA, on hospice care  PLAN:  1. Conservative management 2. Encouragement 3. We'll see the patient have cardiac symptoms arise otherwise follow-up in one year with hope that he will be released from hospice   SUBJECTIVE: Christian Kaiser is a 79 y.o. male who is doing well. He is now on hospice because of pancreatic cancer. Therapy has been stopped. He appears to be doing remarkably well. He was out blowing leaves yesterday. He has an occasional anginal quality chest pain. Nitroglycerin use is rare. He denies orthopnea, PND, lower extremity edema, palpitations and syncope. He does note that physicians, and on irregular heart rhythm. He has had premature ventricular beats that we have monitored over time but never found high-grade ectopy.   Wt Readings from Last 3 Encounters:  04/27/14 184 lb (83.462 kg)  04/20/14 189 lb 6.4 oz (85.911 kg)  04/04/14 193 lb 4.8 oz (87.68 kg)     Past Medical History  Diagnosis Date  . Diabetes mellitus     type 2  . Kidney stones   . HTN (hypertension)   . HLD (hyperlipidemia)   . CAD (coronary artery disease)     s/p 4v CABG 1990s and redo CABG 2006; cath 2007 showed patent grafts  . Carotid disease, bilateral     06/2010 carotid doppler <40% RICA stenosis and 55-37% LICA stenosis  . Gastric ulcer 2003  . Diverticulitis   . Anal fissure     s/p  repair  . Shingles   . Cervical spondylosis   . Sacroiliitis   . Pancreas cancer 10/2012  . Bulging disc     Current Outpatient Prescriptions  Medication Sig Dispense Refill  . Artificial Tear Solution (JUST TEARS EYE DROPS OP) Apply 1 drop to eye as needed (dry eyes).     Marland Kitchen aspirin EC 81 MG tablet Take 81 mg by mouth daily.    . bisacodyl (DULCOLAX) 5 MG EC tablet Take 5 mg by mouth daily as needed for mild constipation or moderate constipation.    . diphenhydramine-acetaminophen (TYLENOL PM) 25-500 MG TABS Take 2 tablets by mouth at bedtime as needed (sleep).    Marland Kitchen glimepiride (AMARYL) 4 MG tablet Take 4 mg by mouth 2 (two) times daily.    Marland Kitchen lidocaine-prilocaine (EMLA) cream Apply topically as needed. apply over port 1-2 hours prior to chemotherapy. 30 g 3  . metFORMIN (GLUCOPHAGE) 1000 MG tablet Take 1,000 mg by mouth daily.     Marland Kitchen NOVOFINE 32G X 6 MM MISC     . ONE TOUCH ULTRA TEST test strip     . senna-docusate (SENOKOT-S) 8.6-50 MG per tablet Take 1 tablet by mouth 2 (two) times daily. 60 tablet 0  . sorbitol 70 % solution Take 15 mLs by mouth daily as needed (for severe constipation). 480 mL 0  . haloperidol (HALDOL) 2 MG tablet Take 2 mg by mouth every 4 (  four) hours as needed (prn nausea).    Marland Kitchen HYDROmorphone (DILAUDID) 2 MG tablet     . isosorbide mononitrate (IMDUR) 60 MG 24 hr tablet Take 1 tablet (60 mg total) by mouth every morning. 90 tablet 3  . Lancets (ONETOUCH ULTRASOFT) lancets     . LORazepam (ATIVAN) 0.5 MG tablet Take 1 tablet (0.5 mg total) by mouth every 8 (eight) hours as needed (PRN nausea). 20 tablet 0  . morphine (MS CONTIN) 100 MG 12 hr tablet Take 1 tablet (100 mg total) by mouth every 12 (twelve) hours. 60 tablet 0  . morphine (ROXANOL) 20 MG/ML concentrated solution Take 1-2 ml every 4 hours as needed 30 mL 0  . nitroGLYCERIN (NITROSTAT) 0.4 MG SL tablet Place 0.4 mg under the tongue every 5 (five) minutes as needed for chest pain.    Marland Kitchen ondansetron (ZOFRAN)  8 MG tablet Take 1 tablet (8 mg total) by mouth every 8 (eight) hours as needed for nausea or vomiting. 20 tablet 1  . prochlorperazine (COMPAZINE) 10 MG tablet Take 1 tablet (10 mg total) by mouth every 6 (six) hours as needed (nausea). 60 tablet 1   No current facility-administered medications for this visit.   Facility-Administered Medications Ordered in Other Visits  Medication Dose Route Frequency Provider Last Rate Last Dose  . sodium chloride 0.9 % injection 10 mL  10 mL Intravenous PRN Ladell Pier, MD   10 mL at 09/27/13 0856  . sodium chloride 0.9 % injection 10 mL  10 mL Intravenous PRN Ladell Pier, MD   10 mL at 10/11/13 0851    Allergies:    Allergies  Allergen Reactions  . Altace [Ramipril] Cough    cough     Social History:  The patient  reports that he quit smoking about 11 years ago. He has never used smokeless tobacco. He reports that he does not drink alcohol or use illicit drugs.   ROS:  Please see the history of present illness.   No transient neurological complaints, denies claudication, no episodes of syncope, no prolonged tachycardia palpitations, decreasing weight.   All other systems reviewed and negative.   OBJECTIVE: VS:  BP 127/60 mmHg  Pulse 80  Ht 6' 1.2" (1.859 m)  Wt 184 lb (83.462 kg)  BMI 24.15 kg/m2 Well nourished, well developed, in no acute distress, clicks relatively normal HEENT: normal Neck: JVD flat. Carotid bruit absent  Cardiac:  normal S1, S2; RRR; no murmur Lungs:  clear to auscultation bilaterally, no wheezing, rhonchi or rales Abd: soft, nontender, no hepatomegaly Ext: Edema absent. Pulses 2+ Skin: warm and dry Neuro:  CNs 2-12 intact, no focal abnormalities noted  EKG:  Old inferior myocardial infarction, sinus rhythm, uniform PVCs, incomplete right bundle branch block, lateral infarct, old.       Signed, Illene Labrador III, MD 04/27/2014 9:42 AM

## 2014-04-27 NOTE — Patient Instructions (Signed)
Your physician recommends that you continue on your current medications as directed. Please refer to the Current Medication list given to you today.  Your physician wants you to follow-up in: 1 year. You will receive a reminder letter in the mail two months in advance. If you don't receive a letter, please call our office to schedule the follow-up appointment.  

## 2014-05-01 ENCOUNTER — Telehealth: Payer: Self-pay | Admitting: *Deleted

## 2014-05-01 DIAGNOSIS — C257 Malignant neoplasm of other parts of pancreas: Secondary | ICD-10-CM

## 2014-05-01 MED ORDER — SORBITOL 70 % PO SOLN: 15.0000 mL | Freq: Every day | ORAL | Status: AC | PRN

## 2014-05-01 NOTE — Telephone Encounter (Signed)
REFILL ESCRIBED TO PHARMACY.

## 2014-05-22 ENCOUNTER — Ambulatory Visit: Payer: Medicare Other | Admitting: Oncology

## 2014-05-25 ENCOUNTER — Ambulatory Visit (HOSPITAL_BASED_OUTPATIENT_CLINIC_OR_DEPARTMENT_OTHER): Payer: Medicare Other

## 2014-05-25 ENCOUNTER — Other Ambulatory Visit: Payer: Self-pay | Admitting: *Deleted

## 2014-05-25 ENCOUNTER — Telehealth: Payer: Self-pay | Admitting: Nurse Practitioner

## 2014-05-25 ENCOUNTER — Ambulatory Visit (HOSPITAL_BASED_OUTPATIENT_CLINIC_OR_DEPARTMENT_OTHER): Payer: Medicare Other | Admitting: Oncology

## 2014-05-25 DIAGNOSIS — G893 Neoplasm related pain (acute) (chronic): Secondary | ICD-10-CM | POA: Diagnosis not present

## 2014-05-25 DIAGNOSIS — C252 Malignant neoplasm of tail of pancreas: Secondary | ICD-10-CM

## 2014-05-25 DIAGNOSIS — C257 Malignant neoplasm of other parts of pancreas: Secondary | ICD-10-CM

## 2014-05-25 DIAGNOSIS — C259 Malignant neoplasm of pancreas, unspecified: Secondary | ICD-10-CM

## 2014-05-25 DIAGNOSIS — C787 Secondary malignant neoplasm of liver and intrahepatic bile duct: Secondary | ICD-10-CM

## 2014-05-25 DIAGNOSIS — Z95828 Presence of other vascular implants and grafts: Secondary | ICD-10-CM

## 2014-05-25 DIAGNOSIS — Z452 Encounter for adjustment and management of vascular access device: Secondary | ICD-10-CM

## 2014-05-25 MED ORDER — MEGESTROL ACETATE 40 MG/ML PO SUSP: 200.0000 mg | Freq: Two times a day (BID) | ORAL | Status: DC

## 2014-05-25 MED ORDER — MORPHINE SULFATE 15 MG PO TABS
ORAL_TABLET | ORAL | Status: AC
  Filled 2014-05-25: qty 2

## 2014-05-25 MED ORDER — SODIUM CHLORIDE 0.9 % IJ SOLN
10.0000 mL | INTRAMUSCULAR | Status: DC | PRN
  Administered 2014-05-25: 10 mL via INTRAVENOUS
  Filled 2014-05-25: qty 10

## 2014-05-25 MED ORDER — HEPARIN SOD (PORK) LOCK FLUSH 100 UNIT/ML IV SOLN
500.0000 [IU] | Freq: Once | INTRAVENOUS | Status: AC
  Administered 2014-05-25: 500 [IU] via INTRAVENOUS
  Filled 2014-05-25: qty 5

## 2014-05-25 MED ORDER — MORPHINE SULFATE ER 60 MG PO TBCR: 60.0000 mg | EXTENDED_RELEASE_TABLET | Freq: Two times a day (BID) | ORAL | Status: DC

## 2014-05-25 MED ORDER — MORPHINE SULFATE 15 MG PO TABS
30.0000 mg | ORAL_TABLET | Freq: Once | ORAL | Status: AC
  Administered 2014-05-25: 30 mg via ORAL

## 2014-05-25 NOTE — Patient Instructions (Signed)

## 2014-05-25 NOTE — Telephone Encounter (Signed)
Pt confirmed labs/ov per 12/11 POF, gave pt AVS..... KJ, sent msg to add chemo °

## 2014-05-25 NOTE — Patient Instructions (Signed)
[facility] Discharge Instructions  RECOMMENDATIONS MADE BY THE CONSULTANT AND ANY TEST RESULTS WILL BE SENT TO YOUR REFERRING PHYSICIAN.  MEDICATIONS PRESCRIBED:   Megace suspension 5 cc twice daily by mouth for appetite  MS Contin 60 mg (long acting)-take one every 12 hours around the clock for pain control  INSTRUCTIONS GIVEN AND DISCUSSED:  Use the MS Contin routinely, not as needed  SPECIAL INSTRUCTIONS/FOLLOW-UP:  Will request Hospice nurse see you more often  Thank you for choosing [facility] to provide your oncology and hematology care.  To afford each patient quality time with our providers, please arrive at least 30 minutes before your scheduled appointment time.  With your help, our goal is to use those 30 minutes to complete the necessary work-up to ensure our physicians have the information they need to help with your evaluation and healthcare recommendations.     ___________________  Should you have questions after your visit to [facility], please contact our office at [phone] between the hours of 8:30 a.m. and 4:30 p.m.  Voicemails left after 4:00 p.m. will not be returned until the following business day.  For prescription refill requests, have your pharmacy contact our office with your prescription refill request. We request 24 hour notice for all refill requests.

## 2014-05-25 NOTE — Progress Notes (Signed)
  [facility] OFFICE PROGRESS NOTE   Diagnosis: Pancreas cancer  INTERVAL HISTORY:   He returns as scheduled. He reports a poor appetite. He developed pain in the low back while driving to the Cancer center today. He has not been taking breakthrough pain medication at home. His wife request we decrease the MS Contin dose as he has been drowsy. The hospice nurse is visiting weekly. He is no longer taking insulin or metformin.   Objective:  Vital signs in last 24 hours:  There were no vitals taken for this visit.    HEENT: No thrush. Resp: Lungs clear bilaterally Cardio: Regular rate and rhythm, tachycardia GI: Mild tenderness in the mid upper abdomen, no hepatomegaly Vascular: No leg edema Musculoskeletal: No spine tenderness  Neurologic: The leg strength is intact bilaterally   Portacath/PICC-without erythema   Medications: I have reviewed the patient's current medications.  Assessment/Plan: 1. Adenocarcinoma of the pancreas status post ultrasound-guided biopsy of a liver lesion 10/18/2012 consistent with metastatic pancreas cancer. CT 10/11/2012 revealed a pancreas tail mass and liver metastases. CA 19-9 in normal range on 10/24/2012. Initiation of gemcitabine/Abraxane on a day 1, day 8 day 15 schedule on 10/28/2012. Current schedule is day one/day 8.  Restaging CT 01/26/2013 revealed a decrease in the size of liver metastases and no new lesions   Gemcitabine/Abraxane continued on a day 1, day 8 schedule.   Restaging CT 04/25/2013 with a decrease in the size of the pancreas mass and liver lesions.   Gemcitabine/Abraxane continued on a day 1, day 8 schedule.   Restaging CT 08/08/2013-further regression of the pancreas mass, stable liver lesions.   Gemcitabine/Abraxane continued on a day 1, day 8 schedule.   Gemcitabine/Abraxane changed to an every 2 week schedule beginning 10/11/2013.   Restaging CT abdomen/pelvis 12/26/2013 with interval  enlargement of a peripherally enhancing lesion in the right hepatic lobe; stable lesion left hepatic lobe; interval mild progression of the mass involving the pancreatic tail.   Cycle 1 FOLFOX 02/07/2014.   Cycle 2 FOLFOX 02/28/2014.   CT abdomen/pelvis 03/08/2014 showed slight interval enlargement of the mass in the tail of the pancreas, enlargement of pre-existing hepatic metastases and 3 new lesions in the dome of the liver. 2. Abdominal pain secondary to the pancreas mass.  3. Anorexia/weight loss. He has again developed anorexia. 4. Diabetes.  5. History of coronary artery disease. 6. Headache following cycle 1 day 1 gemcitabine/Abraxane. Question related to Zofran.  7. History of thrombocytopenia secondary to chemotherapy. 8. 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 Question secondary to Abraxane versus diabetes.  9. Depression-he declined an antidepressant. Counseling services were offered as well. 10. History ofThrombocytopenia secondary to chemotherapy. 11. MRI lumbar spine 02/14/2014 with disc protrusion L5-S1 with foraminal encroachment bilaterally right greater than left.  12. Hospitalization 03/05/2014 through 03/10/2014 for pain control. 13. Nausea -improved with Haldol    Disposition:  Mr. Larch appears to be declining. He appears wasted today. We will decrease the MS Contin to 60 mg and continue Roxanol for breakthrough pain. He will receive MSIR at the Cancer center today. He will try Megace as an appetite stimulant. We discussed the increased risk of venous thrombosis with Megace.  We will ask for more frequent hospice visits.  He will return for an office visit in 2 weeks.  [name], MD  05/25/2014  11:16 AM

## 2014-05-25 NOTE — Progress Notes (Signed)
@  1145: Left VM for Hospice nurse, Leeanne Rio with medication changes and request to visit patient more frequently. Assess pain and his medication management as well as what to expect in future for him and his wife. He is also sad, tearful and may benefit from social worker or chaplin visit. Provided wife with updated med list.

## 2014-05-25 NOTE — Addendum Note (Signed)
Addended by: Tania Ade on: 05/25/2014 12:16 PM   Modules accepted: Orders, Medications

## 2014-05-28 NOTE — Telephone Encounter (Signed)
RECEIVED A FAX [facility] CONCERNING A PRIOR AUTHORIZATION FOR MEGESTROL SUSPENSION. THIS REQUEST WAS PLACED IN THE MANAGED CARE BIN.

## 2014-06-04 ENCOUNTER — Emergency Department (HOSPITAL_COMMUNITY)

## 2014-06-04 ENCOUNTER — Other Ambulatory Visit: Payer: Self-pay | Admitting: Oncology

## 2014-06-04 ENCOUNTER — Inpatient Hospital Stay (HOSPITAL_COMMUNITY)
Admission: EM | Admit: 2014-06-04 | Discharge: 2014-06-06 | DRG: 176 | Disposition: A | Discharge status: Home / Self Care | Attending: Internal Medicine | Admitting: Internal Medicine

## 2014-06-04 ENCOUNTER — Encounter (HOSPITAL_COMMUNITY): Payer: Self-pay | Admitting: Emergency Medicine

## 2014-06-04 DIAGNOSIS — Z515 Encounter for palliative care: Secondary | ICD-10-CM | POA: Diagnosis not present

## 2014-06-04 DIAGNOSIS — Z951 Presence of aortocoronary bypass graft: Secondary | ICD-10-CM

## 2014-06-04 DIAGNOSIS — E785 Hyperlipidemia, unspecified: Secondary | ICD-10-CM | POA: Diagnosis present

## 2014-06-04 DIAGNOSIS — Z79899 Other long term (current) drug therapy: Secondary | ICD-10-CM | POA: Diagnosis not present

## 2014-06-04 DIAGNOSIS — C228 Malignant neoplasm of liver, primary, unspecified as to type: Secondary | ICD-10-CM | POA: Diagnosis present

## 2014-06-04 DIAGNOSIS — I2699 Other pulmonary embolism without acute cor pulmonale: Secondary | ICD-10-CM | POA: Diagnosis present

## 2014-06-04 DIAGNOSIS — C7889 Secondary malignant neoplasm of other digestive organs: Secondary | ICD-10-CM | POA: Diagnosis present

## 2014-06-04 DIAGNOSIS — C259 Malignant neoplasm of pancreas, unspecified: Secondary | ICD-10-CM

## 2014-06-04 DIAGNOSIS — C7989 Secondary malignant neoplasm of other specified sites: Secondary | ICD-10-CM | POA: Diagnosis present

## 2014-06-04 DIAGNOSIS — K602 Anal fissure, unspecified: Secondary | ICD-10-CM | POA: Diagnosis present

## 2014-06-04 DIAGNOSIS — I10 Essential (primary) hypertension: Secondary | ICD-10-CM | POA: Diagnosis present

## 2014-06-04 DIAGNOSIS — R079 Chest pain, unspecified: Secondary | ICD-10-CM | POA: Diagnosis present

## 2014-06-04 DIAGNOSIS — E118 Type 2 diabetes mellitus with unspecified complications: Secondary | ICD-10-CM | POA: Diagnosis present

## 2014-06-04 DIAGNOSIS — Z87891 Personal history of nicotine dependence: Secondary | ICD-10-CM

## 2014-06-04 DIAGNOSIS — F329 Major depressive disorder, single episode, unspecified: Secondary | ICD-10-CM | POA: Diagnosis present

## 2014-06-04 DIAGNOSIS — B029 Zoster without complications: Secondary | ICD-10-CM | POA: Diagnosis present

## 2014-06-04 DIAGNOSIS — C257 Malignant neoplasm of other parts of pancreas: Secondary | ICD-10-CM | POA: Diagnosis present

## 2014-06-04 DIAGNOSIS — Z66 Do not resuscitate: Secondary | ICD-10-CM | POA: Diagnosis present

## 2014-06-04 DIAGNOSIS — I2510 Atherosclerotic heart disease of native coronary artery without angina pectoris: Secondary | ICD-10-CM | POA: Diagnosis present

## 2014-06-04 DIAGNOSIS — K5900 Constipation, unspecified: Secondary | ICD-10-CM | POA: Diagnosis present

## 2014-06-04 LAB — BASIC METABOLIC PANEL
Anion gap: 23 — ABNORMAL HIGH (ref 5–15)
BUN: 25 mg/dL — AB (ref 6–23)
CALCIUM: 9.8 mg/dL (ref 8.4–10.5)
CO2: 20 meq/L (ref 19–32)
CREATININE: 1.11 mg/dL (ref 0.50–1.35)
Chloride: 90 mEq/L — ABNORMAL LOW (ref 96–112)
GFR calc Af Amer: 72 mL/min — ABNORMAL LOW (ref >90)
GFR, EST NON AFRICAN AMERICAN: 62 mL/min — AB (ref >90)
GLUCOSE: 476 mg/dL — AB (ref 70–99)
Potassium: 4.5 mEq/L (ref 3.7–5.3)
Sodium: 133 mEq/L — ABNORMAL LOW (ref 137–147)

## 2014-06-04 LAB — GLUCOSE, CAPILLARY: Glucose-Capillary: 376 mg/dL — ABNORMAL HIGH (ref 70–99)

## 2014-06-04 LAB — I-STAT TROPONIN, ED: Troponin i, poc: 0.04 ng/mL (ref 0.00–0.08)

## 2014-06-04 LAB — CBC
HEMATOCRIT: 35.7 % — AB (ref 39.0–52.0)
HEMOGLOBIN: 11.9 g/dL — AB (ref 13.0–17.0)
MCH: 26.7 pg (ref 26.0–34.0)
MCHC: 33.3 g/dL (ref 30.0–36.0)
MCV: 80 fL (ref 78.0–100.0)
Platelets: 166 10*3/uL (ref 150–400)
RBC: 4.46 MIL/uL (ref 4.22–5.81)
RDW: 14.1 % (ref 11.5–15.5)
WBC: 6.7 10*3/uL (ref 4.0–10.5)

## 2014-06-04 LAB — LIPASE, BLOOD: LIPASE: 25 U/L (ref 11–59)

## 2014-06-04 LAB — TROPONIN I

## 2014-06-04 LAB — TSH: TSH: 3.15 u[IU]/mL (ref 0.350–4.500)

## 2014-06-04 LAB — PRO B NATRIURETIC PEPTIDE: Pro B Natriuretic peptide (BNP): 1627 pg/mL — ABNORMAL HIGH (ref 0–450)

## 2014-06-04 MED ORDER — ONDANSETRON HCL 4 MG/2ML IJ SOLN: 4.0000 mg | Freq: Four times a day (QID) | INTRAMUSCULAR | Status: DC | PRN

## 2014-06-04 MED ORDER — HALOPERIDOL 2 MG PO TABS
2.0000 mg | ORAL_TABLET | ORAL | Status: DC | PRN
  Filled 2014-06-04: qty 1

## 2014-06-04 MED ORDER — HYDROMORPHONE HCL 1 MG/ML IJ SOLN
1.0000 mg | INTRAMUSCULAR | Status: DC | PRN
  Administered 2014-06-04: 1 mg via INTRAVENOUS
  Filled 2014-06-04: qty 1

## 2014-06-04 MED ORDER — ZOLPIDEM TARTRATE 5 MG PO TABS: 5.0000 mg | ORAL_TABLET | Freq: Every evening | ORAL | Status: DC | PRN

## 2014-06-04 MED ORDER — SODIUM CHLORIDE 0.9 % IJ SOLN
3.0000 mL | Freq: Two times a day (BID) | INTRAMUSCULAR | Status: DC
  Administered 2014-06-04 – 2014-06-06 (×3): 3 mL via INTRAVENOUS

## 2014-06-04 MED ORDER — FOLIC ACID 1 MG PO TABS
1.0000 mg | ORAL_TABLET | Freq: Every day | ORAL | Status: DC
  Administered 2014-06-04 – 2014-06-06 (×3): 1 mg via ORAL
  Filled 2014-06-04 (×3): qty 1

## 2014-06-04 MED ORDER — JUST TEARS EYE DROPS OP SOLN: OPHTHALMIC | Status: DC | PRN

## 2014-06-04 MED ORDER — ISOSORBIDE MONONITRATE ER 60 MG PO TB24
60.0000 mg | ORAL_TABLET | Freq: Every morning | ORAL | Status: DC
  Administered 2014-06-05 – 2014-06-06 (×2): 60 mg via ORAL
  Filled 2014-06-04 (×2): qty 1

## 2014-06-04 MED ORDER — LORAZEPAM 0.5 MG PO TABS: 0.5000 mg | ORAL_TABLET | Freq: Three times a day (TID) | ORAL | Status: DC | PRN

## 2014-06-04 MED ORDER — MORPHINE SULFATE 4 MG/ML IJ SOLN
4.0000 mg | INTRAMUSCULAR | Status: DC | PRN
  Administered 2014-06-04: 4 mg via INTRAVENOUS
  Filled 2014-06-04: qty 1

## 2014-06-04 MED ORDER — MORPHINE SULFATE ER 60 MG PO TBCR
60.0000 mg | EXTENDED_RELEASE_TABLET | Freq: Two times a day (BID) | ORAL | Status: DC
  Administered 2014-06-04 – 2014-06-06 (×4): 60 mg via ORAL
  Filled 2014-06-04 (×4): qty 1

## 2014-06-04 MED ORDER — HEPARIN (PORCINE) IN NACL 100-0.45 UNIT/ML-% IJ SOLN
1200.0000 [IU]/h | INTRAMUSCULAR | Status: DC
  Administered 2014-06-04: 1200 [IU]/h via INTRAVENOUS
  Filled 2014-06-04: qty 250

## 2014-06-04 MED ORDER — ONDANSETRON HCL 4 MG PO TABS: 4.0000 mg | ORAL_TABLET | Freq: Four times a day (QID) | ORAL | Status: DC | PRN

## 2014-06-04 MED ORDER — POLYVINYL ALCOHOL 1.4 % OP SOLN
1.0000 [drp] | OPHTHALMIC | Status: DC | PRN
  Filled 2014-06-04: qty 15

## 2014-06-04 MED ORDER — IOHEXOL 350 MG/ML SOLN
75.0000 mL | Freq: Once | INTRAVENOUS | Status: AC | PRN
  Administered 2014-06-04: 75 mL via INTRAVENOUS

## 2014-06-04 MED ORDER — NITROGLYCERIN 0.4 MG SL SUBL: 0.4000 mg | SUBLINGUAL_TABLET | SUBLINGUAL | Status: DC | PRN

## 2014-06-04 MED ORDER — VITAMIN B-1 100 MG PO TABS
100.0000 mg | ORAL_TABLET | Freq: Every day | ORAL | Status: DC
  Administered 2014-06-04 – 2014-06-06 (×3): 100 mg via ORAL
  Filled 2014-06-04 (×3): qty 1

## 2014-06-04 MED ORDER — HEPARIN (PORCINE) IN NACL 100-0.45 UNIT/ML-% IJ SOLN
1200.0000 [IU]/h | INTRAMUSCULAR | Status: DC
  Administered 2014-06-04: 1200 [IU]/h via INTRAVENOUS
  Filled 2014-06-04 (×2): qty 250

## 2014-06-04 MED ORDER — HYDROMORPHONE HCL 1 MG/ML IJ SOLN
1.0000 mg | Freq: Once | INTRAMUSCULAR | Status: AC
  Administered 2014-06-04: 1 mg via INTRAVENOUS
  Filled 2014-06-04: qty 1

## 2014-06-04 MED ORDER — MORPHINE SULFATE 4 MG/ML IJ SOLN
4.0000 mg | Freq: Once | INTRAMUSCULAR | Status: AC
  Administered 2014-06-04: 4 mg via INTRAVENOUS
  Filled 2014-06-04: qty 1

## 2014-06-04 MED ORDER — ADULT MULTIVITAMIN W/MINERALS CH
1.0000 | ORAL_TABLET | Freq: Every day | ORAL | Status: DC
  Administered 2014-06-04 – 2014-06-06 (×3): 1 via ORAL
  Filled 2014-06-04 (×3): qty 1

## 2014-06-04 MED ORDER — INSULIN ASPART 100 UNIT/ML ~~LOC~~ SOLN: 0.0000 [IU] | Freq: Three times a day (TID) | SUBCUTANEOUS | Status: DC

## 2014-06-04 MED ORDER — SODIUM CHLORIDE 0.9 % IV BOLUS (SEPSIS)
1000.0000 mL | Freq: Once | INTRAVENOUS | Status: AC
  Administered 2014-06-04: 1000 mL via INTRAVENOUS

## 2014-06-04 MED ORDER — ACETAMINOPHEN 650 MG RE SUPP: 650.0000 mg | Freq: Four times a day (QID) | RECTAL | Status: DC | PRN

## 2014-06-04 MED ORDER — OXYCODONE HCL 5 MG PO TABS
5.0000 mg | ORAL_TABLET | ORAL | Status: DC | PRN
  Administered 2014-06-05: 5 mg via ORAL
  Filled 2014-06-04: qty 1

## 2014-06-04 MED ORDER — VITAMIN B-12 1000 MCG PO TABS
1000.0000 ug | ORAL_TABLET | Freq: Every day | ORAL | Status: DC
  Administered 2014-06-05 – 2014-06-06 (×2): 1000 ug via ORAL
  Filled 2014-06-04 (×2): qty 1

## 2014-06-04 MED ORDER — ACETAMINOPHEN 325 MG PO TABS: 650.0000 mg | ORAL_TABLET | Freq: Four times a day (QID) | ORAL | Status: DC | PRN

## 2014-06-04 MED ORDER — SORBITOL 70 % PO SOLN
15.0000 mL | Freq: Every day | ORAL | Status: DC | PRN
  Filled 2014-06-04: qty 15

## 2014-06-04 MED ORDER — CLOTRIMAZOLE 1 % EX CREA: 1.0000 "application " | TOPICAL_CREAM | CUTANEOUS | Status: DC | PRN

## 2014-06-04 MED ORDER — DIPHENHYDRAMINE-APAP (SLEEP) 25-500 MG PO TABS: 1.0000 | ORAL_TABLET | Freq: Every evening | ORAL | Status: DC | PRN

## 2014-06-04 MED ORDER — HEPARIN BOLUS VIA INFUSION
4000.0000 [IU] | Freq: Once | INTRAVENOUS | Status: AC
  Administered 2014-06-04: 4000 [IU] via INTRAVENOUS
  Filled 2014-06-04: qty 4000

## 2014-06-04 MED ORDER — NITROGLYCERIN 0.4 MG SL SUBL
0.4000 mg | SUBLINGUAL_TABLET | SUBLINGUAL | Status: DC | PRN
  Administered 2014-06-04: 0.4 mg via SUBLINGUAL
  Filled 2014-06-04: qty 1

## 2014-06-04 MED ORDER — INSULIN ASPART 100 UNIT/ML ~~LOC~~ SOLN
0.0000 [IU] | Freq: Three times a day (TID) | SUBCUTANEOUS | Status: DC
  Administered 2014-06-04 – 2014-06-05 (×2): 9 [IU] via SUBCUTANEOUS
  Administered 2014-06-05: 3 [IU] via SUBCUTANEOUS
  Administered 2014-06-05: 7 [IU] via SUBCUTANEOUS

## 2014-06-04 MED ORDER — MEGESTROL ACETATE 40 MG/ML PO SUSP
200.0000 mg | Freq: Two times a day (BID) | ORAL | Status: DC
Start: 2014-06-04 — End: 2014-06-06
  Administered 2014-06-04 – 2014-06-06 (×4): 200 mg via ORAL
  Filled 2014-06-04 (×5): qty 5

## 2014-06-04 MED ORDER — SENNOSIDES-DOCUSATE SODIUM 8.6-50 MG PO TABS
1.0000 | ORAL_TABLET | Freq: Two times a day (BID) | ORAL | Status: DC | PRN
  Filled 2014-06-04: qty 1

## 2014-06-04 NOTE — Progress Notes (Signed)
ANTICOAGULATION CONSULT NOTE - Initial Consult  Pharmacy Consult for heparin Indication: pulmonary embolus  Allergies  Allergen Reactions  . Altace [Ramipril] Cough    cough     Patient Measurements: Height: 6\' 1"  (185.4 cm) Weight: 163 lb (73.936 kg) IBW/kg (Calculated) : 79.9 Heparin Dosing Weight: 74 kg  Vital Signs: Temp: 97.9 F (36.6 C) (12/21 1503) Temp Source: Oral (12/21 1503) BP: 117/59 mmHg (12/21 1845) Pulse Rate: 70 (12/21 1845)  Labs:  Recent Labs  06/04/14 1515  HGB 11.9*  HCT 35.7*  PLT 166  CREATININE 1.11    Estimated Creatinine Clearance: 58.3 mL/min (by C-G formula based on Cr of 1.11).   Medical History: Past Medical History  Diagnosis Date  . Diabetes mellitus     type 2  . Kidney stones   . HTN (hypertension)   . HLD (hyperlipidemia)   . CAD (coronary artery disease)     s/p 4v CABG 1990s and redo CABG 2006; cath 2007 showed patent grafts  . Carotid disease, bilateral     06/2010 carotid doppler <40% RICA stenosis and 34-19% LICA stenosis  . Gastric ulcer 2003  . Diverticulitis   . Anal fissure     s/p repair  . Shingles   . Cervical spondylosis   . Sacroiliitis   . Pancreas cancer 10/2012  . Bulging disc     Medications:   (Not in a hospital admission) Scheduled:    Assessment: 79yo male with history of CAD presents with chest pain. Pharmacy is consulted to dose heparin for PE. Hgb 11.9, Plt 166, and sCr 1.11.  Goal of Therapy:  Heparin level 0.3-0.7 units/ml Monitor platelets by anticoagulation protocol: Yes   Plan:  Give 4000 units bolus x 1 Start heparin infusion at 1200 units/hr Check anti-Xa level in 8 hours and daily while on heparin Continue to monitor H&H and platelets  Monitor s/sx of bleeding  Andrey Cota. Diona Foley, PharmD Clinical Pharmacist Pager [phone] 06/04/2014,7:19 PM

## 2014-06-04 NOTE — ED Notes (Signed)
Lipase added to  Blood in the lab

## 2014-06-04 NOTE — ED Notes (Signed)
The pt returned from c-t/  C/o pain

## 2014-06-04 NOTE — Progress Notes (Signed)
ED RN visit [facility] ED rm B [facility]) Ronn Melena RN Pt to [facility] ED via [facility] for evaluation of chest pain. Family did notify [facility] that patient was going to the ED. PT is a full code. [facility] Dx;Pancraetic Ca w/ metastasis to the liver.  Patient seen at bedside, lying on the ED stretcher, wife and family at bedside. Patient c/o substernal/epigastirc pain of 9/10 during visit.  Patient relates that he took his 60mg  of MSContin at 2pm today. He had stopped taking it regularly because "my pain was better". Education provided regarding importance of taking pain medication as scheduled. Patient and his wife voiced understanding. Writer alerted staff RN Eliezer Lofts to patient's pain rating and patient was given nitro 0.4mg  as ordered by ED attending.  Awaiting relief. Staff RN back  in to take patient to the bathroom via wheelchair. Patient accompanied by his wife.  [facility] on call RN to follow up on patient disposition. Please call [phone] with any hospice needs. Thank you Flo Shanks RN, BSN, [facility] RN liaison

## 2014-06-04 NOTE — ED Notes (Signed)
Med given for pain.

## 2014-06-04 NOTE — ED Provider Notes (Signed)
CSN: [id number]     Arrival date & time 06/04/14  1451 History   First MD Initiated Contact with Patient 06/04/14 1503     Chief Complaint  Patient presents with  . Chest Pain     (Consider location/radiation/quality/duration/timing/severity/associated sxs/prior Treatment) Patient is a 79 y.o. male presenting with chest pain.  Chest Pain Pain location:  Substernal area Pain quality: sharp   Pain radiates to:  Does not radiate Pain severity:  Severe Onset quality:  Sudden Duration:  12 hours Timing:  Constant Progression:  Unchanged Chronicity:  Recurrent (reports similar symptoms with MI several years ago) Context comment:  Pain started when he woke up and went to the bathroom this morning. Relieved by:  Nothing Exacerbated by: pain not worse with exertion, but did become SOB with exertion. Ineffective treatments: oxycontin. Associated symptoms: abdominal pain (chronic), dizziness (chronic) and shortness of breath (especially when walking)   Associated symptoms: no diaphoresis, no nausea and not vomiting     Past Medical History  Diagnosis Date  . Diabetes mellitus     type 2  . Kidney stones   . HTN (hypertension)   . HLD (hyperlipidemia)   . CAD (coronary artery disease)     s/p 4v CABG 1990s and redo CABG 2006; cath 2007 showed patent grafts  . Carotid disease, bilateral     06/2010 carotid doppler <40% RICA stenosis and 54-65% LICA stenosis  . Gastric ulcer 2003  . Diverticulitis   . Anal fissure     s/p repair  . Shingles   . Cervical spondylosis   . Sacroiliitis   . Pancreas cancer 10/2012  . Bulging disc    Past Surgical History  Procedure Laterality Date  . Coronary artery bypass graft      1990s (SVG OM2/OM3, SVG to RCA, LIMA to LAD) and redo 2006 (reverse SVT to OM & distal LCx, reverse SVG to PDA, preservation of LIMA)  . Rotator cuff repair      Left  . Knee arthroscopy      Right   Family History  Problem Relation Age of Onset  . Cancer Father     History  Substance Use Topics  . Smoking status: Former Smoker    Quit date: 10/19/2002  . Smokeless tobacco: Never Used  . Alcohol Use: No    Review of Systems  Constitutional: Negative for diaphoresis.  Respiratory: Positive for shortness of breath (especially when walking).   Cardiovascular: Positive for chest pain.  Gastrointestinal: Positive for abdominal pain (chronic). Negative for nausea and vomiting.  Neurological: Positive for dizziness (chronic).  All other systems reviewed and are negative.     Allergies  Altace  Home Medications   Prior to Admission medications   Medication Sig Start Date End Date Taking? Authorizing Provider  aspirin EC 81 MG tablet Take 81 mg by mouth daily.   Yes Historical Provider, MD  clotrimazole (LOTRIMIN) 1 % cream Apply 1 application topically as needed (for rash). Groin rash 05/15/14  Yes Historical Provider, MD  haloperidol (HALDOL) 2 MG tablet Take 2 mg by mouth every 4 (four) hours as needed (prn nausea). 04/16/14  Yes Ardeen Jourdain, MD  megestrol (MEGACE) 40 MG/ML suspension Take 5 mLs (200 mg total) by mouth 2 (two) times daily. 05/25/14  Yes Ladell Pier, MD  morphine (MS CONTIN) 60 MG 12 hr tablet Take 1 tablet (60 mg total) by mouth every 12 (twelve) hours. 05/25/14  Yes Ladell Pier, MD  morphine (  MS CONTIN) 60 MG 12 hr tablet Take 60 mg by mouth as needed for pain.   Yes Historical Provider, MD  ondansetron (ZOFRAN) 8 MG tablet Take 8 mg by mouth 3 (three) times daily as needed. 04/14/14  Yes Historical Provider, MD  senna-docusate (SENOKOT-S) 8.6-50 MG per tablet Take 1 tablet by mouth 2 (two) times daily. Patient taking differently: Take 1 tablet by mouth 2 (two) times daily as needed.  02/20/14  Yes April K Palumbo-Rasch, MD  sorbitol 70 % solution Take 15 mLs by mouth daily as needed (for severe constipation). 05/01/14  Yes Ladell Pier, MD  vitamin B-12 (CYANOCOBALAMIN) 1000 MCG tablet Take 1,000 mcg by mouth  daily.   Yes Historical Provider, MD  Artificial Tear Solution (JUST TEARS EYE DROPS OP) Apply 1 drop to eye as needed (dry eyes).     Historical Provider, MD  glimepiride (AMARYL) 4 MG tablet Take 4 mg by mouth 2 (two) times daily.    Historical Provider, MD  isosorbide mononitrate (IMDUR) 60 MG 24 hr tablet Take 1 tablet (60 mg total) by mouth every morning. 07/03/13   Sinclair Grooms, MD  Lancets Glory Rosebush ULTRASOFT) lancets  05/02/13   Historical Provider, MD  lidocaine-prilocaine (EMLA) cream Apply topically as needed. apply over port 1-2 hours prior to chemotherapy. 05/17/13   Owens Shark, NP  LORazepam (ATIVAN) 0.5 MG tablet Take 1 tablet (0.5 mg total) by mouth every 8 (eight) hours as needed (PRN nausea). 02/28/14   Drue Second, NP  morphine (ROXANOL) 20 MG/ML concentrated solution Take 1-2 ml every 4 hours as needed Patient not taking: Reported on 05/25/2014 03/05/14   Ladell Pier, MD  nitroGLYCERIN (NITROSTAT) 0.4 MG SL tablet Place 0.4 mg under the tongue every 5 (five) minutes as needed for chest pain.    Historical Provider, MD  NOVOFINE 32G X 6 MM MISC  08/28/13   Historical Provider, MD  ONE TOUCH ULTRA TEST test strip  05/30/13   Historical Provider, MD   BP 149/96 mmHg  Pulse 83  Temp(Src) 97.9 F (36.6 C) (Oral)  Resp 20  Ht 6\' 1"  (6.967 m)  Wt 163 lb (73.936 kg)  BMI 21.51 kg/m2  SpO2 100% Physical Exam  Constitutional: He is oriented to person, place, and time. He appears well-developed and well-nourished. No distress.  HENT:  Head: Normocephalic and atraumatic.  Mouth/Throat: Oropharynx is clear and moist.  Eyes: Conjunctivae are normal. Pupils are equal, round, and reactive to light. No scleral icterus.  Neck: Neck supple.  Cardiovascular: Normal rate, regular rhythm, normal heart sounds and intact distal pulses.   No murmur heard. Pulmonary/Chest: Effort normal. No stridor. No respiratory distress. He has no wheezes. He has rales (fine rales in bases).   Abdominal: Soft. He exhibits no distension. There is no tenderness. There is no rebound and no guarding.  Musculoskeletal: Normal range of motion. He exhibits edema (trace in RLE.  Also linear scar over this anterior leg).  Neurological: He is alert and oriented to person, place, and time.  Skin: Skin is warm and dry. No rash noted.  Psychiatric: He has a normal mood and affect. His behavior is normal.  Nursing note and vitals reviewed.   ED Course  CRITICAL CARE Performed by: Artis Delay Authorized by: Artis Delay Total critical care time: 45 minutes Critical care time was exclusive of separately billable procedures and treating other patients. Critical care was necessary to treat or prevent imminent or life-threatening deterioration of  the following conditions: Pulmonary Emboli. Critical care was time spent personally by me on the following activities: development of treatment plan with patient or surrogate, discussions with consultants, evaluation of patient's response to treatment, examination of patient, obtaining history from patient or surrogate, ordering and performing treatments and interventions, ordering and review of laboratory studies, ordering and review of radiographic studies, pulse oximetry, re-evaluation of patient's condition and review of old charts.   (including critical care time) Labs Review Labs Reviewed  CBC - Abnormal; Notable for the following:    Hemoglobin 11.9 (*)    HCT 35.7 (*)    All other components within normal limits  BASIC METABOLIC PANEL - Abnormal; Notable for the following:    Sodium 133 (*)    Chloride 90 (*)    Glucose, Bld 476 (*)    BUN 25 (*)    GFR calc non Af Amer 62 (*)    GFR calc Af Amer 72 (*)    Anion gap 23 (*)    All other components within normal limits  PRO B NATRIURETIC PEPTIDE - Abnormal; Notable for the following:    Pro B Natriuretic peptide (BNP) 1627.0 (*)    All other components within normal limits  GLUCOSE,  CAPILLARY - Abnormal; Notable for the following:    Glucose-Capillary 376 (*)    All other components within normal limits  LIPASE, BLOOD  COMPREHENSIVE METABOLIC PANEL  CBC  TSH  HEMOGLOBIN A1C  APTT  PROTIME-INR  TROPONIN I  TROPONIN I  TROPONIN I  I-STAT TROPOININ, ED    Imaging Review Dg Chest 2 View  06/04/2014   CLINICAL DATA:  Shortness of breath. History of pancreatic carcinoma  EXAM: CHEST  2 VIEW  COMPARISON:  CT angiogram chest obtained earlier in the day  FINDINGS: There is underlying emphysematous change. There is no frank edema or consolidation. The pulmonary nodular lesions seen on chest CT are not well seen on radiography. Patient is status post coronary artery bypass grafting. No adenopathy. No bone lesions. Port-A-Cath tip in superior vena cava. No pneumothorax.  IMPRESSION: Underlying emphysema.  No edema or consolidation.   Electronically Signed   By: Lowella Grip M.D.   On: 06/04/2014 17:51   Ct Angio Chest Pe W/cm &/or Wo Cm  06/04/2014   CLINICAL DATA:  One day history of chest pain and difficulty breathing. Pancreatic carcinoma  EXAM: CT ANGIOGRAPHY CHEST WITH CONTRAST  TECHNIQUE: Multidetector CT imaging of the chest was performed using the standard protocol during bolus administration of intravenous contrast. Multiplanar CT image reconstructions and MIPs were obtained to evaluate the vascular anatomy.  CONTRAST:  70mL OMNIPAQUE IOHEXOL 350 MG/ML SOLN  COMPARISON:  Chest radiograph February 20, 2014  FINDINGS: There is acute pulmonary emboli in superior segment left lower lobe pulmonary arterial branches. No more central pulmonary emboli are appreciated on this study. There is no right heart strain.  There is no abdominal aortic aneurysm. There is atherosclerotic change in the aorta.  There is bilateral apical pleural thickening, slightly asymmetric on the right compared to the left. There is underlying emphysematous change. There is a pulmonary nodular lesion  arising from the pleura in the superior segment of the right lower lobe measuring 1.1 x 1.0 cm. There is a nodular lesion in the superior segment of the right lower lobe on slice 48 series 6 measuring 4 x 3 mm. There is a 4 mm nodular opacity in the superior segment of the right lower lobe on slice  59 series 6. There is a 4 mm nodular opacity in the lateral segment of the left lower lobe on slice 64 series 6. There is a 6 mm nodular opacity in the posterior segment of the right lower lobe on slice 67 series 6. There is a 3 mm nodular opacity abutting the pleura in the medial segment of the left lower lobe on slice 67 series 6. There are scattered 1-2 mm nodular opacities in both lungs as well. On axial slice 31 series 6, there is a nodular opacity abutting the pleura in the posterior segment of the left upper lobe measuring 8 x 7 mm.  There is no appreciable thoracic adenopathy. The pericardium is not thickened. There arm multiple areas of coronary artery calcification. Patient is status post coronary artery bypass grafting.  In the visualized upper abdomen, there is widespread hepatic metastatic disease. There is a mass in the body of the pancreas measuring 3.7 x 2.3 cm. There is a cyst in the upper pole of the right kidney measuring 2.3 x 2.3 cm. There is degenerative change in the thoracic spine. There are no blastic or lytic bone lesions. Thyroid contains nodular lesions in the right lobe, largest measuring 1.1 x 1.3 cm.  Review of the MIP images confirms the above findings.  IMPRESSION: Peripheral pulmonary emboli in superior segment left lower lobe pulmonary artery branches. No major vessel pulmonary emboli. No right heart strain.  Multiple pulmonary nodular lesions, probably representing metastatic foci. Widespread hepatic metastatic disease. Pancreatic mass present. Thyroid nodular lesions also present of uncertain etiology.  No lung edema or consolidation.  No adenopathy appreciable.  Critical  Value/emergent results were called by telephone at the time of interpretation on 06/04/2014 at 5:50 pm to Dr. Artis Delay , who verbally acknowledged these results.   Electronically Signed   By: Lowella Grip M.D.   On: 06/04/2014 17:50  All radiology studies independently viewed by me.      EKG Interpretation   Date/Time:  Monday June 04 2014 15:00:20 EST Ventricular Rate:  83 PR Interval:  134 QRS Duration: 109 QT Interval:  398 QTC Calculation: 468 R Axis:   68 Text Interpretation:  Sinus rhythm Ventricular premature complex Left  atrial enlargement RSR' in V1 or V2, probably normal variant Baseline  wander in lead(s) V4 No significant change was found Confirmed by [facility]   MD, TREY ([id number]) on 06/04/2014 3:14:01 PM      MDM   Final diagnoses:  Chest pain  Pulmonary Emboli  79 yo male presenting with chest pain and shortness of breath. Workup reveals pulmonary emboli. I discussed the case with patient, family, Dr. Jana Hakim (Oncology), and Dr. Humphrey Rolls (internal medicine).  He was started on heparin and admitted by internal medicine.      Artis Delay, MD 06/04/14 2139

## 2014-06-04 NOTE — ED Notes (Signed)
The pts pain is better but  Not gone.  Family at the bedside.  Going to xray

## 2014-06-04 NOTE — ED Notes (Signed)
Pt arrives POV from home with substernal chest pain and SOB that began at 0230 this AM while getting up to go to the bathroom. Pt reports the pain has been constant. Tried taking oxycodone at home with no relief and came to ED. Pt awake, alert, oriented x4, Hx of 3 heart attacks.

## 2014-06-04 NOTE — H&P (Signed)
[facility] History and Physical  Christian Kaiser HBZ:[id number] DOB: 06/13/1935 DOA: 06/04/2014  Referring physician: Artis Delay, MD PCP: Mathews Argyle, MD   Chief Complaint: Chest Pain and Shortness of breath  HPI: Christian Kaiser is a 79 y.o. male presents with chest pain. He states that the pain started at 230am. Patient states pain does not radiate. He states that there is no change with respirations. Patient states no diaphoresis noted. He states that there has been no fevers noted. He states there is no cough or hemoptysis. Associated with the pain was shortness of breath. He rated that pain as 9/10. He states the pain is different from the pain of his prior heart attacks. He has no dizziness. He states that the pain is more located in the lower central chest and upper abdominal area. He states that he has no wheeze noted. He has no syncope noted. The patient is apparently under hospice care at this time. I did also discuss with the patient code status and while he does not want resuscitation the wife and family want to discuss the details with him.   Review of Systems:  Constitutional:  No night sweats, Fevers, chills, ++fatigue.  HEENT:  No headaches, sneezing, itching, ear ache, nasal congestion, post nasal drip,  Cardio-vascular:  ++chest pain, no Orthopnea, PND, swelling in lower extremities GI:  No heartburn, indigestion, nausea, vomiting, loss of appetite  Resp: ++shortness of breath at rest. No coughing up of blood.No wheezing Skin:  no rash or lesions GU:  no dysuria, change in color of urine, no urgency or frequency  Musculoskeletal:  No joint pain or swelling. No decreased range of motion  Psych:  No change in mood or affect. No depression or anxiety   Past Medical History  Diagnosis Date  . Diabetes mellitus     type 2  . Kidney stones   . HTN (hypertension)   . HLD (hyperlipidemia)   . CAD (coronary artery disease)     s/p 4v CABG 1990s and  redo CABG 2006; cath 2007 showed patent grafts  . Carotid disease, bilateral     06/2010 carotid doppler <40% RICA stenosis and 10-17% LICA stenosis  . Gastric ulcer 2003  . Diverticulitis   . Anal fissure     s/p repair  . Shingles   . Cervical spondylosis   . Sacroiliitis   . Pancreas cancer 10/2012  . Bulging disc    Past Surgical History  Procedure Laterality Date  . Coronary artery bypass graft      1990s (SVG OM2/OM3, SVG to RCA, LIMA to LAD) and redo 2006 (reverse SVT to OM & distal LCx, reverse SVG to PDA, preservation of LIMA)  . Rotator cuff repair      Left  . Knee arthroscopy      Right   Social History:  reports that he quit smoking about 11 years ago. He has never used smokeless tobacco. He reports that he does not drink alcohol or use illicit drugs.  Allergies  Allergen Reactions  . Altace [Ramipril] Cough    cough     Family History  Problem Relation Age of Onset  . Cancer Father      Prior to Admission medications   Medication Sig Start Date End Date Taking? Authorizing Provider  Artificial Tear Solution (JUST TEARS EYE DROPS OP) Apply 1 drop to eye as needed (dry eyes).    Yes Historical Provider, MD  aspirin EC 81 MG tablet Take 81  mg by mouth daily.   Yes Historical Provider, MD  clotrimazole (LOTRIMIN) 1 % cream Apply 1 application topically as needed (for rash). Groin rash 05/15/14  Yes Historical Provider, MD  diphenhydramine-acetaminophen (TYLENOL PM) 25-500 MG TABS Take 1 tablet by mouth at bedtime as needed (for sleep).   Yes Historical Provider, MD  haloperidol (HALDOL) 2 MG tablet Take 2 mg by mouth every 4 (four) hours as needed (prn nausea). 04/16/14  Yes Ardeen Jourdain, MD  isosorbide mononitrate (IMDUR) 60 MG 24 hr tablet Take 1 tablet (60 mg total) by mouth every morning. 07/03/13  Yes Belva Crome III, MD  megestrol (MEGACE) 40 MG/ML suspension Take 5 mLs (200 mg total) by mouth 2 (two) times daily. 05/25/14  Yes Ladell Pier, MD    morphine (MS CONTIN) 60 MG 12 hr tablet Take 1 tablet (60 mg total) by mouth every 12 (twelve) hours. 05/25/14  Yes Ladell Pier, MD  ondansetron (ZOFRAN) 8 MG tablet Take 8 mg by mouth 3 (three) times daily as needed for nausea or vomiting.  04/14/14  Yes Historical Provider, MD  polyethylene glycol (MIRALAX / GLYCOLAX) packet Take 17 g by mouth daily as needed for moderate constipation.   Yes Historical Provider, MD  senna-docusate (SENOKOT-S) 8.6-50 MG per tablet Take 1 tablet by mouth 2 (two) times daily. Patient taking differently: Take 1 tablet by mouth 2 (two) times daily as needed.  02/20/14  Yes April K Palumbo-Rasch, MD  sorbitol 70 % solution Take 15 mLs by mouth daily as needed (for severe constipation). 05/01/14  Yes Ladell Pier, MD  vitamin B-12 (CYANOCOBALAMIN) 1000 MCG tablet Take 1,000 mcg by mouth daily.   Yes Historical Provider, MD  lidocaine-prilocaine (EMLA) cream Apply topically as needed. apply over port 1-2 hours prior to chemotherapy. 05/17/13   Owens Shark, NP  LORazepam (ATIVAN) 0.5 MG tablet Take 1 tablet (0.5 mg total) by mouth every 8 (eight) hours as needed (PRN nausea). 02/28/14   Drue Second, NP  morphine (ROXANOL) 20 MG/ML concentrated solution Take 1-2 ml every 4 hours as needed Patient not taking: Reported on 05/25/2014 03/05/14   Ladell Pier, MD  nitroGLYCERIN (NITROSTAT) 0.4 MG SL tablet Place 0.4 mg under the tongue every 5 (five) minutes as needed for chest pain.    Historical Provider, MD   Physical Exam: Filed Vitals:   06/04/14 1644 06/04/14 1706 06/04/14 1813 06/04/14 1845  BP: 126/92 130/78 131/78 117/59  Pulse:  73 71 70  Temp:      TempSrc:      Resp: 18 20 18 12   Height:      Weight:      SpO2: 97% 100% 99% 99%    Wt Readings from Last 3 Encounters:  06/04/14 73.936 kg (163 lb)  04/27/14 83.462 kg (184 lb)  04/20/14 85.911 kg (189 lb 6.4 oz)    General:  Appears calm and comfortable Eyes: PERRL, normal lids, irises &  conjunctiva ENT: grossly normal hearing, lips & tongue Neck: no LAD, masses or thyromegaly Cardiovascular: RRR, no m/r/g. No LE edema. Respiratory: CTA bilaterally, no w/r/r. Normal respiratory effort. Abdomen: soft, some epigastric tenderness noted Skin: no rash or induration seen on limited exam Musculoskeletal: grossly normal tone BUE/BLE Psychiatric: grossly normal mood and affect, speech fluent and appropriate Neurologic: grossly non-focal          Labs on Admission:  Basic Metabolic Panel:  Recent Labs Lab 06/04/14 1515  NA 133*  K 4.5  CL 90*  CO2 20  GLUCOSE 476*  BUN 25*  CREATININE 1.11  CALCIUM 9.8   Liver Function Tests: No results for input(s): AST, ALT, ALKPHOS, BILITOT, PROT, ALBUMIN in the last 168 hours.  Recent Labs Lab 06/04/14 1515  LIPASE 25   No results for input(s): AMMONIA in the last 168 hours. CBC:  Recent Labs Lab 06/04/14 1515  WBC 6.7  HGB 11.9*  HCT 35.7*  MCV 80.0  PLT 166   Cardiac Enzymes: No results for input(s): CKTOTAL, CKMB, CKMBINDEX, TROPONINI in the last 168 hours.  BNP (last 3 results)  Recent Labs  06/04/14 1515  PROBNP 1627.0*   CBG: No results for input(s): GLUCAP in the last 168 hours.  Radiological Exams on Admission: Dg Chest 2 View  06/04/2014   CLINICAL DATA:  Shortness of breath. History of pancreatic carcinoma  EXAM: CHEST  2 VIEW  COMPARISON:  CT angiogram chest obtained earlier in the day  FINDINGS: There is underlying emphysematous change. There is no frank edema or consolidation. The pulmonary nodular lesions seen on chest CT are not well seen on radiography. Patient is status post coronary artery bypass grafting. No adenopathy. No bone lesions. Port-A-Cath tip in superior vena cava. No pneumothorax.  IMPRESSION: Underlying emphysema.  No edema or consolidation.   Electronically Signed   By: Lowella Grip M.D.   On: 06/04/2014 17:51   Ct Angio Chest Pe W/cm &/or Wo Cm  06/04/2014   CLINICAL  DATA:  One day history of chest pain and difficulty breathing. Pancreatic carcinoma  EXAM: CT ANGIOGRAPHY CHEST WITH CONTRAST  TECHNIQUE: Multidetector CT imaging of the chest was performed using the standard protocol during bolus administration of intravenous contrast. Multiplanar CT image reconstructions and MIPs were obtained to evaluate the vascular anatomy.  CONTRAST:  54mL OMNIPAQUE IOHEXOL 350 MG/ML SOLN  COMPARISON:  Chest radiograph February 20, 2014  FINDINGS: There is acute pulmonary emboli in superior segment left lower lobe pulmonary arterial branches. No more central pulmonary emboli are appreciated on this study. There is no right heart strain.  There is no abdominal aortic aneurysm. There is atherosclerotic change in the aorta.  There is bilateral apical pleural thickening, slightly asymmetric on the right compared to the left. There is underlying emphysematous change. There is a pulmonary nodular lesion arising from the pleura in the superior segment of the right lower lobe measuring 1.1 x 1.0 cm. There is a nodular lesion in the superior segment of the right lower lobe on slice 48 series 6 measuring 4 x 3 mm. There is a 4 mm nodular opacity in the superior segment of the right lower lobe on slice 59 series 6. There is a 4 mm nodular opacity in the lateral segment of the left lower lobe on slice 64 series 6. There is a 6 mm nodular opacity in the posterior segment of the right lower lobe on slice 67 series 6. There is a 3 mm nodular opacity abutting the pleura in the medial segment of the left lower lobe on slice 67 series 6. There are scattered 1-2 mm nodular opacities in both lungs as well. On axial slice 31 series 6, there is a nodular opacity abutting the pleura in the posterior segment of the left upper lobe measuring 8 x 7 mm.  There is no appreciable thoracic adenopathy. The pericardium is not thickened. There arm multiple areas of coronary artery calcification. Patient is status post  coronary artery bypass grafting.  In  the visualized upper abdomen, there is widespread hepatic metastatic disease. There is a mass in the body of the pancreas measuring 3.7 x 2.3 cm. There is a cyst in the upper pole of the right kidney measuring 2.3 x 2.3 cm. There is degenerative change in the thoracic spine. There are no blastic or lytic bone lesions. Thyroid contains nodular lesions in the right lobe, largest measuring 1.1 x 1.3 cm.  Review of the MIP images confirms the above findings.  IMPRESSION: Peripheral pulmonary emboli in superior segment left lower lobe pulmonary artery branches. No major vessel pulmonary emboli. No right heart strain.  Multiple pulmonary nodular lesions, probably representing metastatic foci. Widespread hepatic metastatic disease. Pancreatic mass present. Thyroid nodular lesions also present of uncertain etiology.  No lung edema or consolidation.  No adenopathy appreciable.  Critical Value/emergent results were called by telephone at the time of interpretation on 06/04/2014 at 5:50 pm to Dr. Artis Delay , who verbally acknowledged these results.   Electronically Signed   By: Lowella Grip M.D.   On: 06/04/2014 17:50     Assessment/Plan Principal Problem:   Pulmonary embolism Active Problems:   Hypertension   Hyperlipidemia   CAD (coronary artery disease)   Malignant neoplasm of other specified sites of pancreas   Diabetes mellitus type 2, controlled, with complications   1. Pulmonary Embolism -this is likely the cause of his Chest pain. Peripheral location more likely to cause pain -will admit for pain control -will start on heparin -pharmacy consult for dosing  2. HTN -under good control -will monitor pressures  3. Hyperlipidemia -monitor labs  4. CAD -pain is not typical of his cardiac issues -will continue with imdur and prn nitroglycerin -troponins has been ordered  5. Pancreatic Cancer -metastatic disease -oncology has been  following  6. Diabetes Mellitus -will monitor FSBS -insulin coverage    Code Status: Full Code--I have discussed code status with the patient and family in the room. Patient stated he does not want resuscitation however the family wanted to discuss it further (must indicate code status--if unknown or must be presumed, indicate so) DVT Prophylaxis:On heparin Family Communication: Wife and Kids (indicate person spoken with, if applicable, with phone number if by telephone) Disposition Plan: Home (indicate anticipated LOS)  Time spent: 38min  KHAN,SAADAT A [facility] Pager [phone]

## 2014-06-04 NOTE — ED Notes (Signed)
Ginger ale provided with okay from ED physician.  Admitting physician at the bedside.

## 2014-06-05 DIAGNOSIS — C787 Secondary malignant neoplasm of liver and intrahepatic bile duct: Secondary | ICD-10-CM

## 2014-06-05 LAB — CBC
HCT: 30.9 % — ABNORMAL LOW (ref 39.0–52.0)
Hemoglobin: 10.3 g/dL — ABNORMAL LOW (ref 13.0–17.0)
MCH: 26.4 pg (ref 26.0–34.0)
MCHC: 33.3 g/dL (ref 30.0–36.0)
MCV: 79.2 fL (ref 78.0–100.0)
Platelets: 137 10*3/uL — ABNORMAL LOW (ref 150–400)
RBC: 3.9 MIL/uL — AB (ref 4.22–5.81)
RDW: 14.2 % (ref 11.5–15.5)
WBC: 6.5 10*3/uL (ref 4.0–10.5)

## 2014-06-05 LAB — COMPREHENSIVE METABOLIC PANEL
ALBUMIN: 2.9 g/dL — AB (ref 3.5–5.2)
ALT: 24 U/L (ref 0–53)
AST: 23 U/L (ref 0–37)
Alkaline Phosphatase: 229 U/L — ABNORMAL HIGH (ref 39–117)
Anion gap: 8 (ref 5–15)
BUN: 20 mg/dL (ref 6–23)
CALCIUM: 9.1 mg/dL (ref 8.4–10.5)
CHLORIDE: 102 meq/L (ref 96–112)
CO2: 24 mmol/L (ref 19–32)
CREATININE: 1.12 mg/dL (ref 0.50–1.35)
GFR calc Af Amer: 71 mL/min — ABNORMAL LOW (ref >90)
GFR calc non Af Amer: 61 mL/min — ABNORMAL LOW (ref >90)
Glucose, Bld: 280 mg/dL — ABNORMAL HIGH (ref 70–99)
Potassium: 3.9 mmol/L (ref 3.5–5.1)
SODIUM: 134 mmol/L — AB (ref 135–145)
Total Bilirubin: 0.4 mg/dL (ref 0.3–1.2)
Total Protein: 5.6 g/dL — ABNORMAL LOW (ref 6.0–8.3)

## 2014-06-05 LAB — TROPONIN I
TROPONIN I: 0.05 ng/mL — AB (ref <0.031)
Troponin I: 0.06 ng/mL — ABNORMAL HIGH (ref <0.031)

## 2014-06-05 LAB — GLUCOSE, CAPILLARY
GLUCOSE-CAPILLARY: 234 mg/dL — AB (ref 70–99)
GLUCOSE-CAPILLARY: 353 mg/dL — AB (ref 70–99)
Glucose-Capillary: 317 mg/dL — ABNORMAL HIGH (ref 70–99)
Glucose-Capillary: 295 mg/dL — ABNORMAL HIGH (ref 70–99)

## 2014-06-05 LAB — HEMOGLOBIN A1C
Hgb A1c MFr Bld: 13.1 % — ABNORMAL HIGH (ref <5.7)
MEAN PLASMA GLUCOSE: 329 mg/dL — AB (ref <117)

## 2014-06-05 LAB — PROTIME-INR
INR: 1.32 (ref 0.00–1.49)
Prothrombin Time: 16.6 seconds — ABNORMAL HIGH (ref 11.6–15.2)

## 2014-06-05 LAB — HEPARIN LEVEL (UNFRACTIONATED)
HEPARIN UNFRACTIONATED: 0.71 [IU]/mL — AB (ref 0.30–0.70)
Heparin Unfractionated: 0.69 IU/mL (ref 0.30–0.70)

## 2014-06-05 LAB — APTT: APTT: 170 s — AB (ref 24–37)

## 2014-06-05 MED ORDER — INSULIN ASPART 100 UNIT/ML ~~LOC~~ SOLN
0.0000 [IU] | Freq: Every day | SUBCUTANEOUS | Status: DC
  Administered 2014-06-05: 3 [IU] via SUBCUTANEOUS

## 2014-06-05 MED ORDER — APIXABAN 5 MG PO TABS
10.0000 mg | ORAL_TABLET | Freq: Two times a day (BID) | ORAL | Status: DC
  Administered 2014-06-05 – 2014-06-06 (×3): 10 mg via ORAL
  Filled 2014-06-05 (×4): qty 2

## 2014-06-05 MED ORDER — INSULIN ASPART 100 UNIT/ML ~~LOC~~ SOLN
0.0000 [IU] | Freq: Three times a day (TID) | SUBCUTANEOUS | Status: DC
  Administered 2014-06-06: 5 [IU] via SUBCUTANEOUS
  Administered 2014-06-06: 8 [IU] via SUBCUTANEOUS

## 2014-06-05 MED ORDER — METOPROLOL TARTRATE 1 MG/ML IV SOLN: 5.0000 mg | Freq: Three times a day (TID) | INTRAVENOUS | Status: DC | PRN

## 2014-06-05 MED ORDER — APIXABAN 5 MG PO TABS: 5.0000 mg | ORAL_TABLET | Freq: Two times a day (BID) | ORAL | Status: DC

## 2014-06-05 NOTE — Progress Notes (Signed)
ANTICOAGULATION CONSULT NOTE - Initial Consult  Pharmacy Consult for Eliquis (Apixaban) Indication: pulmonary embolus  Allergies  Allergen Reactions  . Altace [Ramipril] Cough    cough     Patient Measurements: Height: 6\' 1"  (185.4 cm) Weight: 159 lb 9.8 oz (72.4 kg) IBW/kg (Calculated) : 79.9  Vital Signs: Temp: 98.1 F (36.7 C) (12/22 0423) Temp Source: Oral (12/22 0423) BP: 116/75 mmHg (12/22 0423) Pulse Rate: 72 (12/22 0423)  Labs:  Recent Labs  06/04/14 1515 06/04/14 2105 06/05/14 0258 06/05/14 0259 06/05/14 0910  HGB 11.9*  --  10.3*  --   --   HCT 35.7*  --  30.9*  --   --   PLT 166  --  137*  --   --   APTT  --   --  170*  --   --   LABPROT  --   --  16.6*  --   --   INR  --   --  1.32  --   --   HEPARINUNFRC  --   --   --  0.69 0.71*  CREATININE 1.11  --  1.12  --   --   TROPONINI  --  <0.30 0.05*  --  0.06*    Estimated Creatinine Clearance: 56.6 mL/min (by C-G formula based on Cr of 1.12).   Medical History: Past Medical History  Diagnosis Date  . Diabetes mellitus     type 2  . Kidney stones   . HTN (hypertension)   . HLD (hyperlipidemia)   . CAD (coronary artery disease)     s/p 4v CABG 1990s and redo CABG 2006; cath 2007 showed patent grafts  . Carotid disease, bilateral     06/2010 carotid doppler <40% RICA stenosis and 41-32% LICA stenosis  . Gastric ulcer 2003  . Diverticulitis   . Anal fissure     s/p repair  . Shingles   . Cervical spondylosis   . Sacroiliitis   . Pancreas cancer 10/2012  . Bulging disc     Medications:  Prescriptions prior to admission  Medication Sig Dispense Refill Last Dose  . Artificial Tear Solution (JUST TEARS EYE DROPS OP) Apply 1 drop to eye as needed (dry eyes).    Past Week at Unknown time  . aspirin EC 81 MG tablet Take 81 mg by mouth daily.   06/04/2014 at Unknown time  . clotrimazole (LOTRIMIN) 1 % cream Apply 1 application topically as needed (for rash). Groin rash   06/03/2014 at Unknown time   . diphenhydramine-acetaminophen (TYLENOL PM) 25-500 MG TABS Take 1 tablet by mouth at bedtime as needed (for sleep).   Past Week at Unknown time  . haloperidol (HALDOL) 2 MG tablet Take 2 mg by mouth every 4 (four) hours as needed (prn nausea).   Past Week at Unknown time  . isosorbide mononitrate (IMDUR) 60 MG 24 hr tablet Take 1 tablet (60 mg total) by mouth every morning. 90 tablet 3 06/04/2014 at Unknown time  . megestrol (MEGACE) 40 MG/ML suspension Take 5 mLs (200 mg total) by mouth 2 (two) times daily. 240 mL 1 06/04/2014 at Unknown time  . morphine (MS CONTIN) 60 MG 12 hr tablet Take 1 tablet (60 mg total) by mouth every 12 (twelve) hours. 60 tablet 0 06/04/2014 at Unknown time  . ondansetron (ZOFRAN) 8 MG tablet Take 8 mg by mouth 3 (three) times daily as needed for nausea or vomiting.   2 Past Week at Unknown time  .  polyethylene glycol (MIRALAX / GLYCOLAX) packet Take 17 g by mouth daily as needed for moderate constipation.   Past Week at Unknown time  . senna-docusate (SENOKOT-S) 8.6-50 MG per tablet Take 1 tablet by mouth 2 (two) times daily. (Patient taking differently: Take 1 tablet by mouth 2 (two) times daily as needed. ) 60 tablet 0 Past Week at Unknown time  . sorbitol 70 % solution Take 15 mLs by mouth daily as needed (for severe constipation). 480 mL 0 Past Month at Unknown time  . vitamin B-12 (CYANOCOBALAMIN) 1000 MCG tablet Take 1,000 mcg by mouth daily.   06/04/2014 at Unknown time  . lidocaine-prilocaine (EMLA) cream Apply topically as needed. apply over port 1-2 hours prior to chemotherapy. 30 g 3 Taking  . LORazepam (ATIVAN) 0.5 MG tablet Take 1 tablet (0.5 mg total) by mouth every 8 (eight) hours as needed (PRN nausea). 20 tablet 0 Taking  . morphine (ROXANOL) 20 MG/ML concentrated solution Take 1-2 ml every 4 hours as needed (Patient not taking: Reported on 05/25/2014) 30 mL 0 Not Taking  . nitroGLYCERIN (NITROSTAT) 0.4 MG SL tablet Place 0.4 mg under the tongue every 5  (five) minutes as needed for chest pain.   not used    Assessment: 79 yo male with PE.  Patient does have pancreatic CA with mets and is in [facility]. Oncology is following and was consulted. Patient did not want to do any injections, oncology okay with eliquis therapy to treat PE.  Goal of Therapy:  Patient tolerate oral anticoagulant therapy Monitor for s/s of bleeding   Plan:  Eliquis 10mg  po BID for 1 week, then 5mg  po BID Education for patient done.  Isac Sarna, BS Pharm D, BCPS Clinical Pharmacist  06/05/2014,10:18 AM

## 2014-06-05 NOTE — Progress Notes (Addendum)
PROGRESS NOTE  Christian Kaiser SEG:[id number] DOB: 1934/09/17 DOA: 06/04/2014 PCP: Mathews Argyle, MD  Assessment/Plan: Pulmonary Embolism  pain control Transition from heparin to eliquis--- lovenox/coumadin is standard of care- but patient not keen on shots not having labs checked frequently as he is on hospice- not sure patient has much time remaining- spoke with Dr. Benay Spice who will see patient today/tomm -minimal troponin increase  HTN -under good control -will monitor pressures  Hyperlipidemia -monitor labs  CAD -pain is not typical of his cardiac issues -will continue with imdur and prn nitroglycerin -troponins has been ordered  Pancreatic Cancer -metastatic disease -oncology has been following  Diabetes Mellitus -will monitor FSBS -insulin coverage   Code Status: full- wife specifically adament that he not be a DNR- had long discussion that CPR/intubation would not change outcomes/underlying condition Family Communication: wife at bedside Disposition Plan:    Consultants:  Heme onc  Procedures:      HPI/Subjective: Feeling better today  Objective: Filed Vitals:   06/05/14 0423  BP: 116/75  Pulse: 72  Temp: 98.1 F (36.7 C)  Resp: 18   No intake or output data in the 24 hours ending 06/05/14 1006 Filed Weights   06/04/14 1503 06/04/14 2026  Weight: 73.936 kg (163 lb) 72.4 kg (159 lb 9.8 oz)    Exam:   General:  A+Ox3, NAD  Cardiovascular: rrr  Respiratory: clear    Data Reviewed: Basic Metabolic Panel:  Recent Labs Lab 06/04/14 1515 06/05/14 0258  NA 133* 134*  K 4.5 3.9  CL 90* 102  CO2 20 24  GLUCOSE 476* 280*  BUN 25* 20  CREATININE 1.11 1.12  CALCIUM 9.8 9.1   Liver Function Tests:  Recent Labs Lab 06/05/14 0258  AST 23  ALT 24  ALKPHOS 229*  BILITOT 0.4  PROT 5.6*  ALBUMIN 2.9*    Recent Labs Lab 06/04/14 1515  LIPASE 25   No results for input(s): AMMONIA in the last 168  hours. CBC:  Recent Labs Lab 06/04/14 1515 06/05/14 0258  WBC 6.7 6.5  HGB 11.9* 10.3*  HCT 35.7* 30.9*  MCV 80.0 79.2  PLT 166 137*   Cardiac Enzymes:  Recent Labs Lab 06/04/14 2105 06/05/14 0258 06/05/14 0910  TROPONINI <0.30 0.05* 0.06*   BNP (last 3 results)  Recent Labs  06/04/14 1515  PROBNP 1627.0*   CBG:  Recent Labs Lab 06/04/14 2106 06/05/14 0615  GLUCAP 376* 234*    No results found for this or any previous visit (from the past 240 hour(s)).   Studies: Dg Chest 2 View  06/04/2014   CLINICAL DATA:  Shortness of breath. History of pancreatic carcinoma  EXAM: CHEST  2 VIEW  COMPARISON:  CT angiogram chest obtained earlier in the day  FINDINGS: There is underlying emphysematous change. There is no frank edema or consolidation. The pulmonary nodular lesions seen on chest CT are not well seen on radiography. Patient is status post coronary artery bypass grafting. No adenopathy. No bone lesions. Port-A-Cath tip in superior vena cava. No pneumothorax.  IMPRESSION: Underlying emphysema.  No edema or consolidation.   Electronically Signed   By: Lowella Grip M.D.   On: 06/04/2014 17:51   Ct Angio Chest Pe W/cm &/or Wo Cm  06/04/2014   CLINICAL DATA:  One day history of chest pain and difficulty breathing. Pancreatic carcinoma  EXAM: CT ANGIOGRAPHY CHEST WITH CONTRAST  TECHNIQUE: Multidetector CT imaging of the chest was performed using the standard protocol during bolus administration of  intravenous contrast. Multiplanar CT image reconstructions and MIPs were obtained to evaluate the vascular anatomy.  CONTRAST:  39mL OMNIPAQUE IOHEXOL 350 MG/ML SOLN  COMPARISON:  Chest radiograph February 20, 2014  FINDINGS: There is acute pulmonary emboli in superior segment left lower lobe pulmonary arterial branches. No more central pulmonary emboli are appreciated on this study. There is no right heart strain.  There is no abdominal aortic aneurysm. There is atherosclerotic  change in the aorta.  There is bilateral apical pleural thickening, slightly asymmetric on the right compared to the left. There is underlying emphysematous change. There is a pulmonary nodular lesion arising from the pleura in the superior segment of the right lower lobe measuring 1.1 x 1.0 cm. There is a nodular lesion in the superior segment of the right lower lobe on slice 48 series 6 measuring 4 x 3 mm. There is a 4 mm nodular opacity in the superior segment of the right lower lobe on slice 59 series 6. There is a 4 mm nodular opacity in the lateral segment of the left lower lobe on slice 64 series 6. There is a 6 mm nodular opacity in the posterior segment of the right lower lobe on slice 67 series 6. There is a 3 mm nodular opacity abutting the pleura in the medial segment of the left lower lobe on slice 67 series 6. There are scattered 1-2 mm nodular opacities in both lungs as well. On axial slice 31 series 6, there is a nodular opacity abutting the pleura in the posterior segment of the left upper lobe measuring 8 x 7 mm.  There is no appreciable thoracic adenopathy. The pericardium is not thickened. There arm multiple areas of coronary artery calcification. Patient is status post coronary artery bypass grafting.  In the visualized upper abdomen, there is widespread hepatic metastatic disease. There is a mass in the body of the pancreas measuring 3.7 x 2.3 cm. There is a cyst in the upper pole of the right kidney measuring 2.3 x 2.3 cm. There is degenerative change in the thoracic spine. There are no blastic or lytic bone lesions. Thyroid contains nodular lesions in the right lobe, largest measuring 1.1 x 1.3 cm.  Review of the MIP images confirms the above findings.  IMPRESSION: Peripheral pulmonary emboli in superior segment left lower lobe pulmonary artery branches. No major vessel pulmonary emboli. No right heart strain.  Multiple pulmonary nodular lesions, probably representing metastatic foci.  Widespread hepatic metastatic disease. Pancreatic mass present. Thyroid nodular lesions also present of uncertain etiology.  No lung edema or consolidation.  No adenopathy appreciable.  Critical Value/emergent results were called by telephone at the time of interpretation on 06/04/2014 at 5:50 pm to Dr. Artis Delay , who verbally acknowledged these results.   Electronically Signed   By: Lowella Grip M.D.   On: 06/04/2014 17:50    Scheduled Meds: . folic acid  1 mg Oral Daily  . insulin aspart  0-9 Units Subcutaneous TID WC & HS  . isosorbide mononitrate  60 mg Oral q morning - 10a  . megestrol  200 mg Oral BID  . morphine  60 mg Oral Q12H  . multivitamin with minerals  1 tablet Oral Daily  . sodium chloride  3 mL Intravenous Q12H  . thiamine  100 mg Oral Daily  . vitamin B-12  1,000 mcg Oral Daily   Continuous Infusions: . heparin 1,200 Units/hr (06/04/14 2030)   Antibiotics Given (last 72 hours)    None  Principal Problem:   Pulmonary embolism Active Problems:   Hypertension   Hyperlipidemia   CAD (coronary artery disease)   Malignant neoplasm of other specified sites of pancreas   Diabetes mellitus type 2, controlled, with complications    Time spent: 25 min    Finnian Husted  [facility] Pager [phone] If 7PM-7AM, please contact night-coverage at [email or web address], password [facility] 06/05/2014, 10:06 AM  LOS: 1 day

## 2014-06-05 NOTE — Progress Notes (Signed)
Utilization Review Completed.  

## 2014-06-05 NOTE — Progress Notes (Addendum)
Inpatient RN visit- Christian Kaiser [facility] 2W Room 36-[facility] RN Visit-Christian Alford Highland RN  Related admission to [facility] diagnosis of Pancreatic Ca. Pt is FULL CODE  code.   Christian Kaiser seen  lying in bed asleep. Dr. Benay Spice in to see patient.  He was easiliy wakened.  Patient reported that his pain was much improved since admission, he is currently taking 60mg  MS Contin and did receive a PRN  dose of IV hydromorphone 1mg  at aprox 9 pm last evening. Dr. Benay Spice discussed the different routes of and medications that could be used on a prophylactic basis to prevent more blood clots. Patient with pulmonary embolus on CT scan 12/21. Patient has been started on apixaban today.  Dr. Benay Spice advised patient that he felt his pain was from his cancer and not from the blood clot, he also discussed code status, patient remains a full code. At end of visit patient did c/o 4/10 upper abdominal pain, staff RN Christian Kaiser alerted and gave prn dose of oxycodone 5mg . Per chart review and patient report, he ate well at breakfast-100%. Discussed PT evaluation prior to discharge with staff RN Christian Kaiser, prior to admission patient was ambulatory w/o assistive devices, he is currently on strict bedrest, Christian Kaiser to f/u with attending physician. No family present at time of visit. [facility] will continue to follow. Patient's home medication list and transfer summary in place on shadow chart.  Please call [facility] @ [phone]-with any hospice needs.   Thank you. Christian Harries, RN  [facility]  Hospice Liaison  [phone])

## 2014-06-05 NOTE — Evaluation (Signed)
Physical Therapy Evaluation Patient Details Name: Christian Kaiser MRN: [id number] DOB: 07-01-1934 Today's Date: 06/05/2014   History of Present Illness  Pt is a 79 y.o. male presents with chest pain. He states that the pain started at 230am. Patient states pain does not radiate. He states that there is no change with respirations. Patient states no diaphoresis noted. He states that there has been no fevers noted. He states there is no cough or hemoptysis. Associated with the pain was shortness of breath. He rated that pain as 9/10. He states the pain is different from the pain of his prior heart attacks. He has no dizziness. He states that the pain is more located in the lower central chest and upper abdominal area. The patient is apparently under hospice care at this time.   Clinical Impression  Pt admitted with above diagnosis. Pt currently with functional limitations due to the deficits listed below (see PT Problem List). At the time of PT eval pt was able to perform transfers and ambulation with min guard assist or supervision for safety. Pt can ambulate in room using furniture for support, however if walking in the hall would recommend RW use. Pt will benefit from skilled PT to increase their independence and safety with mobility to allow discharge to the venue listed below.  Pt and family asking about continued rehab at home after d/c. Discussed that typically PT services are not combined with hospice care however encouraged pt/family to talk with the case manager/CSW regarding this.     Follow Up Recommendations Home health PT;Supervision/Assistance - 24 hour    Equipment Recommendations  None recommended by PT    Recommendations for Other Services       Precautions / Restrictions Precautions Precautions: Fall Restrictions Weight Bearing Restrictions: No      Mobility  Bed Mobility Overal bed mobility: Needs Assistance Bed Mobility: Supine to Sit     Supine to sit:  Supervision     General bed mobility comments: Increased time and supervision for safety. Pt with heavy use of bed rails for support while coming up to full sitting position.   Transfers Overall transfer level: Needs assistance Equipment used: None Transfers: Sit to/from Stand Sit to Stand: Min guard         General transfer comment: Pt able to power-up to full standing position with no physical assistance. Steadying assist once reached full stand.   Ambulation/Gait Ambulation/Gait assistance: Min guard Ambulation Distance (Feet): 125 Feet Assistive device: Rolling walker (2 wheeled) Gait Pattern/deviations: Step-through pattern;Step-to pattern;Decreased stride length;Trunk flexed Gait velocity: Decreased Gait velocity interpretation: Below normal speed for age/gender General Gait Details: Pt was able to ambulate without a RW while in room, grabbing to furniture if needed for support. In halls pt used RW for balance and energy conservation. Step-to pattern noted with occasional step-through. Pt reports no pain on the R leg, however states he feels stiff from not moving much today.   Stairs            Wheelchair Mobility    Modified Rankin (Stroke Patients Only)       Balance Overall balance assessment: Needs assistance Sitting-balance support: Feet supported;No upper extremity supported Sitting balance-Leahy Scale: Fair     Standing balance support: Bilateral upper extremity supported;During functional activity Standing balance-Leahy Scale: Poor Standing balance comment: Requires UE support/assist to maintain balance.  Pertinent Vitals/Pain Pain Assessment: Faces Faces Pain Scale: Hurts a little bit Pain Location: Abdomen Pain Intervention(s): Monitored during session    Home Living Family/patient expects to be discharged to:: Private residence Living Arrangements: Spouse/significant other;Other relatives  (Grandson) Available Help at Discharge: Family;Available 24 hours/day;Other (Comment) (Pt is under hospice care) Type of Home: House Home Access: Stairs to enter   [employer] of Steps: 2 Home Layout: Two level Home Equipment: [place] - 2 wheels;Shower seat      Prior Function Level of Independence: Independent         Comments: Pt reports he was not using a walker - independent with all mobility.      Hand Dominance   Dominant Hand: Right    Extremity/Trunk Assessment   Upper Extremity Assessment: Defer to OT evaluation           Lower Extremity Assessment: Generalized weakness      Cervical / Trunk Assessment: Kyphotic  Communication   Communication: No difficulties  Cognition Arousal/Alertness: Awake/alert Behavior During Therapy: WFL for tasks assessed/performed Overall Cognitive Status: Within Functional Limits for tasks assessed                      General Comments      Exercises        Assessment/Plan    PT Assessment Patient needs continued PT services  PT Diagnosis Difficulty walking;Generalized weakness   PT Problem List Decreased range of motion;Decreased activity tolerance;Decreased strength;Decreased balance;Decreased mobility;Decreased knowledge of precautions;Cardiopulmonary status limiting activity;Decreased safety awareness;Decreased knowledge of use of DME  PT Treatment Interventions DME instruction;Gait training;Stair training;Functional mobility training;Therapeutic activities;Therapeutic exercise;Neuromuscular re-education;Patient/family education   PT Goals (Current goals can be found in the Care Plan section) Acute Rehab PT Goals Patient Stated Goal: Return home and get more therapy PT Goal Formulation: With patient/family Time For Goal Achievement: 06/19/14 Potential to Achieve Goals: Good    Frequency Min 3X/week   Barriers to discharge        Co-evaluation               End of Session  Equipment Utilized During Treatment: Gait belt Activity Tolerance: Patient tolerated treatment well Patient left: in chair;with call bell/phone within reach;with family/visitor present Nurse Communication: Mobility status         Time: 1749-4496 PT Time Calculation (min) (ACUTE ONLY): 23 min   Charges:   PT Evaluation $Initial PT Evaluation Tier I: 1 Procedure PT Treatments $Gait Training: 8-22 mins   PT G Codes:          Rolinda Roan 06/05/2014, 4:59 PM   Rolinda Roan, PT, DPT Acute Rehabilitation Services Pager: [phone]

## 2014-06-05 NOTE — Progress Notes (Signed)
RM 2W36 [name]                              [facility] MSW Note:  Pt was alert, pleasant, reported pain much improved from yesterday. His young adult grandchildren were present and spouse left just prior to SW entering the room. Pt nor grandchildren voiced any specific needs. Pt reports that he may discharged home tomorrow. Explained that regardless of when he is discharged, a hospice RN will follow within 24hrs of his discharge. Reassured all of continued support. Benedict Needy [place],LCSW

## 2014-06-05 NOTE — Progress Notes (Signed)
Christian OFFICE PROGRESS NOTE   Diagnosis: Pancreas cancer  INTERVAL HISTORY:   He presented yesterday with "chest "discomfort. A CT of the chest revealed a small left-sided pulmonary embolism in addition to a pancreas mass and liver metastases. He reports the pain has improved. He describes discomfort in the mid upper abdomen at present that he rates at a 4/10.  Christian Kaiser is enrolled in the [facility] hospice program. He remains on a full CODE STATUS.  Objective:  Vital signs in last 24 hours:  Blood pressure 100/70, pulse 86, temperature 99.2 F (37.3 C), temperature source Oral, resp. rate 18, height 6\' 1"  (1.854 m), weight 159 lb 9.8 oz (72.4 kg), SpO2 99 %.  Resp: Lungs clear bilaterally, no respiratory distress Cardio: Regular rate and rhythm GI: No hepatomegaly, mild tenderness in the subxiphoid region. No mass Vascular: No leg edema   Portacath/PICC-without erythema  Lab Results:  Lab Results  Component Value Date   WBC 6.5 06/05/2014   HGB 10.3* 06/05/2014   HCT 30.9* 06/05/2014   MCV 79.2 06/05/2014   PLT 137* 06/05/2014   NEUTROABS 2.3 03/14/2014     Imaging:  Dg Chest 2 View  06/04/2014   CLINICAL DATA:  Shortness of breath. History of pancreatic carcinoma  EXAM: CHEST  2 VIEW  COMPARISON:  CT angiogram chest obtained earlier in the day  FINDINGS: There is underlying emphysematous change. There is no frank edema or consolidation. The pulmonary nodular lesions seen on chest CT are not well seen on radiography. Patient is status post coronary artery bypass grafting. No adenopathy. No bone lesions. Port-A-Cath tip in superior vena cava. No pneumothorax.  IMPRESSION: Underlying emphysema.  No edema or consolidation.   Electronically Signed   By: Lowella Grip M.D.   On: 06/04/2014 17:51   Ct Angio Chest Pe W/cm &/or Wo Cm  06/04/2014   CLINICAL DATA:  One day history of chest pain and difficulty breathing. Pancreatic carcinoma  EXAM: CT  ANGIOGRAPHY CHEST WITH CONTRAST  TECHNIQUE: Multidetector CT imaging of the chest was performed using the standard protocol during bolus administration of intravenous contrast. Multiplanar CT image reconstructions and MIPs were obtained to evaluate the vascular anatomy.  CONTRAST:  59mL OMNIPAQUE IOHEXOL 350 MG/ML SOLN  COMPARISON:  Chest radiograph February 20, 2014  FINDINGS: There is acute pulmonary emboli in superior segment left lower lobe pulmonary arterial branches. No more central pulmonary emboli are appreciated on this study. There is no right heart strain.  There is no abdominal aortic aneurysm. There is atherosclerotic change in the aorta.  There is bilateral apical pleural thickening, slightly asymmetric on the right compared to the left. There is underlying emphysematous change. There is a pulmonary nodular lesion arising from the pleura in the superior segment of the right lower lobe measuring 1.1 x 1.0 cm. There is a nodular lesion in the superior segment of the right lower lobe on slice 48 series 6 measuring 4 x 3 mm. There is a 4 mm nodular opacity in the superior segment of the right lower lobe on slice 59 series 6. There is a 4 mm nodular opacity in the lateral segment of the left lower lobe on slice 64 series 6. There is a 6 mm nodular opacity in the posterior segment of the right lower lobe on slice 67 series 6. There is a 3 mm nodular opacity abutting the pleura in the medial segment of the left lower lobe on slice 67 series 6. There are  scattered 1-2 mm nodular opacities in both lungs as well. On axial slice 31 series 6, there is a nodular opacity abutting the pleura in the posterior segment of the left upper lobe measuring 8 x 7 mm.  There is no appreciable thoracic adenopathy. The pericardium is not thickened. There arm multiple areas of coronary artery calcification. Patient is status post coronary artery bypass grafting.  In the visualized upper abdomen, there is widespread hepatic  metastatic disease. There is a mass in the body of the pancreas measuring 3.7 x 2.3 cm. There is a cyst in the upper pole of the right kidney measuring 2.3 x 2.3 cm. There is degenerative change in the thoracic spine. There are no blastic or lytic bone lesions. Thyroid contains nodular lesions in the right lobe, largest measuring 1.1 x 1.3 cm.  Review of the MIP images confirms the above findings.  IMPRESSION: Peripheral pulmonary emboli in superior segment left lower lobe pulmonary artery branches. No major vessel pulmonary emboli. No right heart strain.  Multiple pulmonary nodular lesions, probably representing metastatic foci. Widespread hepatic metastatic disease. Pancreatic mass present. Thyroid nodular lesions also present of uncertain etiology.  No lung edema or consolidation.  No adenopathy appreciable.  Critical Value/emergent results were called by telephone at the time of interpretation on 06/04/2014 at 5:50 pm to Dr. Artis Delay , who verbally acknowledged these results.   Electronically Signed   By: Lowella Grip M.D.   On: 06/04/2014 17:50    Medications: I have reviewed the patient's current medications.  Assessment/Plan:  1. Metastatic pancreas cancer with a pancreas tail mass and liver metastases 2. Chronic pain secondary to #1 3. Admission with "chest "pain-most likely secondary to pancreas cancer 4. Incidental pulmonary embolism noted on a CT of the chest 06/04/2014 5. Diabetes 6. Depression 7. History of nausea-improved with Haldol  Disposition:  Christian Kaiser has metastatic pancreas cancer. His performance status has declined over the past few months. He has enrolled in the [facility] hospice program. He was admitted yesterday with upper abdomen discomfort. I suspect the pain is related to pancreas cancer as opposed to a pulmonary embolism. The pulmonary embolism is most likely an incidental finding. I discussed the risk/benefit of anticoagulation therapy with Christian Kaiser. We  also discussed his CODE STATUS. He would like to remain on a full CODE STATUS for now He would like to continue treatment of reversible conditions for now. He does not wish to receive daily injection therapy. I think it is reasonable to prescribe apixaban for the pulmonary embolism.  Recommendations: 1. Begin anticoagulation therapy with apixaban or rivaroxaban 2. Continue home hospice care  3. Continue MS Contin and Roxanol for pain 4. Continue discussions regarding CODE STATUS with patient and family  Please call oncology as needed. I will schedule outpatient follow-up. [name], MD  06/05/2014  5:08 PM

## 2014-06-05 NOTE — Progress Notes (Signed)
ANTICOAGULATION CONSULT NOTE - Follow Up Consult  Pharmacy Consult for heparin Indication: pulmonary embolus  Labs:  Recent Labs  06/04/14 1515 06/04/14 2105 06/05/14 0258 06/05/14 0259  HGB 11.9*  --  10.3*  --   HCT 35.7*  --  30.9*  --   PLT 166  --  137*  --   APTT  --   --  170*  --   LABPROT  --   --  16.6*  --   INR  --   --  1.32  --   HEPARINUNFRC  --   --   --  0.69  CREATININE 1.11  --  1.12  --   TROPONINI  --  <0.30 0.05*  --      Assessment/Plan:  79yo male therapeutic on heparin with initial dosing for PE; level at high end of goal though suspect given age that bolus is still in effect and expect level to fall some. Will continue gtt at current rate and confirm stable with additional level.   Wynona Neat, PharmD, BCPS  06/05/2014,4:25 AM

## 2014-06-06 ENCOUNTER — Ambulatory Visit: Payer: Medicare Other | Admitting: Nurse Practitioner

## 2014-06-06 ENCOUNTER — Telehealth: Payer: Self-pay | Admitting: *Deleted

## 2014-06-06 LAB — GLUCOSE, CAPILLARY
Glucose-Capillary: 214 mg/dL — ABNORMAL HIGH (ref 70–99)
Glucose-Capillary: 252 mg/dL — ABNORMAL HIGH (ref 70–99)

## 2014-06-06 MED ORDER — APIXABAN 5 MG PO TABS: 5.0000 mg | ORAL_TABLET | Freq: Two times a day (BID) | ORAL | Status: DC

## 2014-06-06 MED ORDER — GLIPIZIDE 5 MG PO TABS: 2.5000 mg | ORAL_TABLET | Freq: Every day | ORAL | Status: DC

## 2014-06-06 MED ORDER — APIXABAN 5 MG PO TABS: 10.0000 mg | ORAL_TABLET | Freq: Two times a day (BID) | ORAL | Status: DC

## 2014-06-06 NOTE — Discharge Instructions (Signed)
Follow with Primary MD Mathews Argyle, MD in 7 days   Get CBC, CMP, 2 view Chest X ray checked  by Primary MD next visit.    Activity: As tolerated with Full fall precautions use walker/cane & assistance as needed   Disposition Home    Diet: Heart Healthy  , with feeding assistance and aspiration precautions as needed.  For Heart failure patients - Check your Weight same time everyday, if you gain over 2 pounds, or you develop in leg swelling, experience more shortness of breath or chest pain, call your Primary MD immediately. Follow Cardiac Low Salt Diet and 1.8 lit/day fluid restriction.   On your next visit with your primary care physician please Get Medicines reviewed and adjusted.   Please request your Prim.MD to go over all Hospital Tests and Procedure/Radiological results at the follow up, please get all Hospital records sent to your Prim MD by signing hospital release before you go home.   If you experience worsening of your admission symptoms, develop shortness of breath, life threatening emergency, suicidal or homicidal thoughts you must seek medical attention immediately by calling 911 or calling your MD immediately  if symptoms less severe.  You Must read complete instructions/literature along with all the possible adverse reactions/side effects for all the Medicines you take and that have been prescribed to you. Take any new Medicines after you have completely understood and accpet all the possible adverse reactions/side effects.   Do not drive, operating heavy machinery, perform activities at heights, swimming or participation in water activities or provide baby sitting services if your were admitted for syncope or siezures until you have seen by Primary MD or a Neurologist and advised to do so again.  Do not drive when taking Pain medications.    Do not take more than prescribed Pain, Sleep and Anxiety Medications  Special Instructions: If you have smoked or  chewed Tobacco  in the last 2 yrs please stop smoking, stop any regular Alcohol  and or any Recreational drug use.  Wear Seat belts while driving.   Please note  You were cared for by a hospitalist during your hospital stay. If you have any questions about your discharge medications or the care you received while you were in the hospital after you are discharged, you can call the unit and asked to speak with the hospitalist on call if the hospitalist that took care of you is not available. Once you are discharged, your primary care physician will handle any further medical issues. Please note that NO REFILLS for any discharge medications will be authorized once you are discharged, as it is imperative that you return to your primary care physician (or establish a relationship with a primary care physician if you do not have one) for your aftercare needs so that they can reassess your need for medications and monitor your lab values.   Information on my medicine - ELIQUIS (apixaban)  This medication education was reviewed with me or my healthcare representative as part of my discharge preparation.  The pharmacist that spoke with me during my hospital stay was:  Jefm Petty, [place]  Why was Eliquis prescribed for you? Eliquis was prescribed to treat blood clots that may have been found in the veins of your legs (deep vein thrombosis) or in your lungs (pulmonary embolism) and to reduce the risk of them occurring again.  What do You need to know about Eliquis ? The starting dose is 10 mg (two 5 mg tablets)  taken TWICE daily for the FIRST SEVEN (7) DAYS, then on 06/12/14  the dose is reduced to ONE 5 mg tablet taken TWICE daily.  Eliquis may be taken with or without food.   Try to take the dose about the same time in the morning and in the evening. If you have difficulty swallowing the tablet whole please discuss with your pharmacist how to take the medication safely.  Take Eliquis exactly as  prescribed and DO NOT stop taking Eliquis without talking to the doctor who prescribed the medication.  Stopping may increase your risk of developing a new blood clot.  Refill your prescription before you run out.  After discharge, you should have regular check-up appointments with your healthcare provider that is prescribing your Eliquis.    What do you do if you miss a dose? If a dose of ELIQUIS is not taken at the scheduled time, take it as soon as possible on the same day and twice-daily administration should be resumed. The dose should not be doubled to make up for a missed dose.  Important Safety Information A possible side effect of Eliquis is bleeding. You should call your healthcare provider right away if you experience any of the following: ? Bleeding from an injury or your nose that does not stop. ? Unusual colored urine (red or dark brown) or unusual colored stools (red or black). ? Unusual bruising for unknown reasons. ? A serious fall or if you hit your head (even if there is no bleeding).  Some medicines may interact with Eliquis and might increase your risk of bleeding or clotting while on Eliquis. To help avoid this, consult your healthcare provider or pharmacist prior to using any new prescription or non-prescription medications, including herbals, vitamins, non-steroidal anti-inflammatory drugs (NSAIDs) and supplements.  This website has more information on Eliquis (apixaban): http://www.eliquis.com/eliquis/home

## 2014-06-06 NOTE — Progress Notes (Signed)
Inpatient RN visit- Christian Kaiser [facility] 2W Room 36-[facility] RN Visit-Karen Alford Highland RN  Related admission to [facility] diagnosis of Pancreatic Ca w/mets.  Pt is FULL CODE.   Patient seen at bedside, sitting up alert and oriented, eating his lunch, Brother in law visiting. Patient denied pain or discomfort, pain is being controlled with current intervention of MS contin 60mg  BID, per chart review patient has not recvd any PRN pain medication today. Per conversation with patient and [facility] Krisit plan is for discharge today by [facility]. Patient will be discharging on Eliquis.  Confirmed with patient that he does have a walker already at this home, no new equipment needs at discharge. [facility] home care team alerted to d/c.  Patient's home medication list and transfer summary in place on shadow chart.  Please call [facility] @ [phone]  with any hospice needs.   Thank you. Flo Shanks, RN, BSN, [facility] Liaison  [phone])

## 2014-06-06 NOTE — Discharge Summary (Signed)
Christian Kaiser, 79 y.o., DOB 10-26-1934, MRN [id number]. Admission date: 06/04/2014 Discharge Date 06/06/2014 Primary MD Mathews Argyle, MD Admitting Physician Allyne Gee, MD  Admission Diagnosis  Chest pain [R07.9]  PCP please follow: -Recheck labs CBC, BMP, patient is started on Eliquis for pulmonary embolism. -Please monitor diabetes, blood sugar was uncontrolled during hospitalization patient is being discharged on glipizide 2.5 mg oral daily.  Discharge Diagnosis   Principal Problem:   Pulmonary embolism Active Problems:   Hypertension   Hyperlipidemia   CAD (coronary artery disease)   Malignant neoplasm of other specified sites of pancreas   Diabetes mellitus type 2, controlled, with complications     Past Medical History  Diagnosis Date  . Diabetes mellitus     type 2  . Kidney stones   . HTN (hypertension)   . HLD (hyperlipidemia)   . CAD (coronary artery disease)     s/p 4v CABG 1990s and redo CABG 2006; cath 2007 showed patent grafts  . Carotid disease, bilateral     06/2010 carotid doppler <40% RICA stenosis and 51-02% LICA stenosis  . Gastric ulcer 2003  . Diverticulitis   . Anal fissure     s/p repair  . Shingles   . Cervical spondylosis   . Sacroiliitis   . Pancreas cancer 10/2012  . Bulging disc     Past Surgical History  Procedure Laterality Date  . Coronary artery bypass graft      1990s (SVG OM2/OM3, SVG to RCA, LIMA to LAD) and redo 2006 (reverse SVT to OM & distal LCx, reverse SVG to PDA, preservation of LIMA)  . Rotator cuff repair      Left  . Knee arthroscopy      Right   Admission HPI/Brief narietive: He presented  with "chest "discomfort. A CT of the chest revealed a small left-sided pulmonary embolism in addition to a pancreas mass and liver metastases. Started initially oh heparin gtt , then transitioned to oral Eliquis. Christian Kaiser is enrolled in the [facility] hospice program. He remains on a full CODE STATUS.  Hospital Course  See H&P, Labs, Consult and Test reports for all details in brief, patient was admitted for **  Principal Problem:   Pulmonary embolism Active Problems:   Hypertension   Hyperlipidemia   CAD (coronary artery disease)   Malignant neoplasm of other specified sites of pancreas   Diabetes mellitus type 2, controlled, with complications   Pulmonary embolism - Agent will be discharged on Eliquis, there is no tachypnea, no tachycardia, no hypoxia, saturating 100% on room air.  HTN -Blood pressure acceptable    CAD -pain is not typical of his cardiac issues -will continue with imdur and prn nitroglycerin -Continue with aspirin  Pancreatic Cancer -metastatic disease -oncology has been following  Diabetes Mellitus -Uncontrolled -Will start low-dose glipizide on discharge    Consults   Oncology  Significant Tests:  See full reports for all details    Dg Chest 2 View  06/04/2014   CLINICAL DATA:  Shortness of breath. History of pancreatic carcinoma  EXAM: CHEST  2 VIEW  COMPARISON:  CT angiogram chest obtained earlier in the day  FINDINGS: There is underlying emphysematous change. There is no frank edema or consolidation. The pulmonary nodular lesions seen on chest CT are not well seen on radiography. Patient is status post coronary artery bypass grafting. No adenopathy. No bone lesions. Port-A-Cath tip in superior vena cava. No pneumothorax.  IMPRESSION: Underlying emphysema.  No edema or  consolidation.   Electronically Signed   By: Lowella Grip M.D.   On: 06/04/2014 17:51   Ct Angio Chest Pe W/cm &/or Wo Cm  06/04/2014   CLINICAL DATA:  One day history of chest pain and difficulty breathing. Pancreatic carcinoma  EXAM: CT ANGIOGRAPHY CHEST WITH CONTRAST  TECHNIQUE: Multidetector CT imaging of the chest was performed using the standard protocol during bolus administration of intravenous contrast. Multiplanar CT image reconstructions and MIPs were obtained to evaluate the  vascular anatomy.  CONTRAST:  102mL OMNIPAQUE IOHEXOL 350 MG/ML SOLN  COMPARISON:  Chest radiograph February 20, 2014  FINDINGS: There is acute pulmonary emboli in superior segment left lower lobe pulmonary arterial branches. No more central pulmonary emboli are appreciated on this study. There is no right heart strain.  There is no abdominal aortic aneurysm. There is atherosclerotic change in the aorta.  There is bilateral apical pleural thickening, slightly asymmetric on the right compared to the left. There is underlying emphysematous change. There is a pulmonary nodular lesion arising from the pleura in the superior segment of the right lower lobe measuring 1.1 x 1.0 cm. There is a nodular lesion in the superior segment of the right lower lobe on slice 48 series 6 measuring 4 x 3 mm. There is a 4 mm nodular opacity in the superior segment of the right lower lobe on slice 59 series 6. There is a 4 mm nodular opacity in the lateral segment of the left lower lobe on slice 64 series 6. There is a 6 mm nodular opacity in the posterior segment of the right lower lobe on slice 67 series 6. There is a 3 mm nodular opacity abutting the pleura in the medial segment of the left lower lobe on slice 67 series 6. There are scattered 1-2 mm nodular opacities in both lungs as well. On axial slice 31 series 6, there is a nodular opacity abutting the pleura in the posterior segment of the left upper lobe measuring 8 x 7 mm.  There is no appreciable thoracic adenopathy. The pericardium is not thickened. There arm multiple areas of coronary artery calcification. Patient is status post coronary artery bypass grafting.  In the visualized upper abdomen, there is widespread hepatic metastatic disease. There is a mass in the body of the pancreas measuring 3.7 x 2.3 cm. There is a cyst in the upper pole of the right kidney measuring 2.3 x 2.3 cm. There is degenerative change in the thoracic spine. There are no blastic or lytic bone  lesions. Thyroid contains nodular lesions in the right lobe, largest measuring 1.1 x 1.3 cm.  Review of the MIP images confirms the above findings.  IMPRESSION: Peripheral pulmonary emboli in superior segment left lower lobe pulmonary artery branches. No major vessel pulmonary emboli. No right heart strain.  Multiple pulmonary nodular lesions, probably representing metastatic foci. Widespread hepatic metastatic disease. Pancreatic mass present. Thyroid nodular lesions also present of uncertain etiology.  No lung edema or consolidation.  No adenopathy appreciable.  Critical Value/emergent results were called by telephone at the time of interpretation on 06/04/2014 at 5:50 pm to Dr. Artis Delay , who verbally acknowledged these results.   Electronically Signed   By: Lowella Grip M.D.   On: 06/04/2014 17:50     Today   Subjective:   Christian Kaiser today has no headache,no chest abdominal pain,no new weakness tingling or numbness, feels much better wants to go home today.   Objective:   Blood pressure 97/74, pulse  74, temperature 98.5 F (36.9 C), temperature source Oral, resp. rate 18, height 6\' 1"  (1.854 m), weight 72.4 kg (159 lb 9.8 oz), SpO2 100 %.  Intake/Output Summary (Last 24 hours) at 06/06/14 1239 Last data filed at 06/05/14 1700  Gross per 24 hour  Intake    480 ml  Output    400 ml  Net     80 ml    Exam Awake Alert, Oriented *3, No new F.N deficits, Normal affect [place].AT,PERRAL Supple Neck,No JVD, No cervical lymphadenopathy appriciated.  Symmetrical Chest wall movement, Good air movement bilaterally, CTAB RRR,No Gallops,Rubs or new Murmurs, No Parasternal Heave +ve B.Sounds, Abd Soft, Non tender, No organomegaly appriciated, No rebound -guarding or rigidity. No Cyanosis, Clubbing or edema, No new Rash or bruise  Data Review    CBC w Diff:  Lab Results  Component Value Date   WBC 6.5 06/05/2014   WBC 3.4* 03/14/2014   HGB 10.3* 06/05/2014   HGB 9.6* 03/14/2014    HCT 30.9* 06/05/2014   HCT 29.5* 03/14/2014   PLT 137* 06/05/2014   PLT 87* 03/14/2014   LYMPHOPCT 23.0 03/14/2014   LYMPHOPCT 25 03/05/2014   MONOPCT 8.1 03/14/2014   MONOPCT 8 03/05/2014   EOSPCT 1.2 03/14/2014   EOSPCT 1 03/05/2014   BASOPCT 0.3 03/14/2014   BASOPCT 0 03/05/2014   CMP:  Lab Results  Component Value Date   NA 134* 06/05/2014   NA 146* 03/14/2014   K 3.9 06/05/2014   K 3.6 03/14/2014   CL 102 06/05/2014   CL 107 11/25/2012   CO2 24 06/05/2014   CO2 25 03/14/2014   BUN 20 06/05/2014   BUN 9.8 03/14/2014   CREATININE 1.12 06/05/2014   CREATININE 1.2 03/14/2014   PROT 5.6* 06/05/2014   PROT 6.2* 03/14/2014   ALBUMIN 2.9* 06/05/2014   ALBUMIN 3.0* 03/14/2014   BILITOT 0.4 06/05/2014   BILITOT 0.42 03/14/2014   ALKPHOS 229* 06/05/2014   ALKPHOS 78 03/14/2014   AST 23 06/05/2014   AST 20 03/14/2014   ALT 24 06/05/2014   ALT 20 03/14/2014  .  Micro Results No results found for this or any previous visit (from the past 240 hour(s)).   Discharge Instructions          Follow-up Information    Follow up with Mathews Argyle, MD. Schedule an appointment as soon as possible for a visit in 1 week.   Specialty:  Internal Medicine   Contact information:   [address] [place] [address] [phone]       Discharge Medications     Medication List    TAKE these medications        apixaban 5 MG Tabs tablet  Commonly known as:  ELIQUIS  - Take 1 tablet (5 mg total) by mouth 2 (two) times daily. Take 2 tablets (10 mg) oral twice daily for 1 week , till 12/28, Then take 1 tablet oral (5mg )  twice daily from 12/29 .  [employer] number PA [id number] approved till 12/06/2014     aspirin EC 81 MG tablet  Take 81 mg by mouth daily.     clotrimazole 1 % cream  Commonly known as:  LOTRIMIN  Apply 1 application topically as needed (for rash). Groin rash     diphenhydramine-acetaminophen 25-500 MG Tabs  Commonly  known as:  TYLENOL PM  Take 1 tablet by mouth at bedtime as needed (for sleep).     glipiZIDE  5 MG tablet  Commonly known as:  GLUCOTROL  Take 0.5 tablets (2.5 mg total) by mouth daily before breakfast.     haloperidol 2 MG tablet  Commonly known as:  HALDOL  Take 2 mg by mouth every 4 (four) hours as needed (prn nausea).     isosorbide mononitrate 60 MG 24 hr tablet  Commonly known as:  IMDUR  Take 1 tablet (60 mg total) by mouth every morning.     JUST TEARS EYE DROPS OP  Apply 1 drop to eye as needed (dry eyes).     lidocaine-prilocaine cream  Commonly known as:  EMLA  Apply topically as needed. apply over port 1-2 hours prior to chemotherapy.     LORazepam 0.5 MG tablet  Commonly known as:  ATIVAN  Take 1 tablet (0.5 mg total) by mouth every 8 (eight) hours as needed (PRN nausea).     megestrol 40 MG/ML suspension  Commonly known as:  MEGACE  Take 5 mLs (200 mg total) by mouth 2 (two) times daily.     morphine 20 MG/ML concentrated solution  Commonly known as:  ROXANOL  Take 1-2 ml every 4 hours as needed     morphine 60 MG 12 hr tablet  Commonly known as:  MS CONTIN  Take 1 tablet (60 mg total) by mouth every 12 (twelve) hours.     nitroGLYCERIN 0.4 MG SL tablet  Commonly known as:  NITROSTAT  Place 0.4 mg under the tongue every 5 (five) minutes as needed for chest pain.     ondansetron 8 MG tablet  Commonly known as:  ZOFRAN  Take 8 mg by mouth 3 (three) times daily as needed for nausea or vomiting.     polyethylene glycol packet  Commonly known as:  MIRALAX / GLYCOLAX  Take 17 g by mouth daily as needed for moderate constipation.     senna-docusate 8.6-50 MG per tablet  Commonly known as:  Senokot-S  Take 1 tablet by mouth 2 (two) times daily.     sorbitol 70 % solution  Take 15 mLs by mouth daily as needed (for severe constipation).     vitamin B-12 1000 MCG tablet  Commonly known as:  CYANOCOBALAMIN  Take 1,000 mcg by mouth daily.          Total Time in preparing paper work, data evaluation and todays exam - 35 minutes  Kaylem Gidney M.D on 06/06/2014 at 12:39 PM  [place]  [phone]

## 2014-06-06 NOTE — Care Management Note (Signed)
    Page 1 of 2   06/06/2014     12:32:51 PM CARE MANAGEMENT NOTE 06/06/2014  Patient:  Christian Kaiser, Christian Kaiser   Account Number:  [id number]  Date Initiated:  06/06/2014  Documentation initiated by:  Marvetta Gibbons  Subjective/Objective Assessment:   Pt admitted with PE     Action/Plan:   PTA pt lived at home  pt is active with [facility] for services at home   Anticipated DC Date:  06/06/2014   Anticipated DC Plan:  [place]  CM consult  Medication Assistance      Choice offered to / List presented to:             Status of service:  Completed, signed off Medicare Important Message given?  NA - LOS <3 / Initial given by admissions (If response is "NO", the following Medicare IM given date fields will be blank) Date Medicare IM given:   Medicare IM given by:   Date Additional Medicare IM given:   Additional Medicare IM given by:    Discharge Disposition:  HOME/SELF CARE  Per UR Regulation:  Reviewed for med. necessity/level of care/duration of stay  If discussed at [place] of Stay Meetings, dates discussed:    Comments:  06/06/14- 1200- Marvetta Gibbons RN, BSN [phone] Referral for Eliquis needs- per benefits check : S/W TONYA   @  [facility]  # [phone] OPT- 0 ELIQUIS 10 MG BID X 7 DAYS- NOT ON FORMULARY  ELIQUIS 5 MG BID DAILY COVER-YES CO-PAY- $ 45.00 TIER- 3 DRUG LEVEL PRIOR APPROVAL YES [phone] PHARMACY : ANY RETAIL  Xarelto checked also and is same Tier 3 drug level at some copay and also needs pre-auth-  -per MD pt will go home on Eliquis- free 30 day trial card given to pt- and copay cost info shared with pt- MD aware of needing to call pre-auth for pt prior to discharge- number in chart.

## 2014-06-06 NOTE — Telephone Encounter (Signed)
Call from pt's daughter asking to discuss pt's case with Dr. Benay Spice. She has missed him during rounds at the hospital. Request to Dr. Benay Spice to call her. [phone]

## 2014-06-06 NOTE — Telephone Encounter (Signed)
Dr. Benay Spice called pt's daughter, Jonelle Sidle as requested.

## 2014-06-07 ENCOUNTER — Telehealth: Payer: Self-pay | Admitting: Oncology

## 2014-06-07 ENCOUNTER — Other Ambulatory Visit: Payer: Self-pay | Admitting: *Deleted

## 2014-06-07 NOTE — Telephone Encounter (Signed)
S/w pt's son in law confirming ML visit per 12/24 POF, also advised I would mail out sch to pt.... KJ

## 2014-06-21 ENCOUNTER — Ambulatory Visit (HOSPITAL_COMMUNITY)
Admission: RE | Admit: 2014-06-21 | Discharge: 2014-06-21 | Disposition: A | Discharge status: Home / Self Care | Payer: Medicare Other | Source: Ambulatory Visit | Attending: Nurse Practitioner | Admitting: Nurse Practitioner

## 2014-06-21 ENCOUNTER — Telehealth: Payer: Self-pay | Admitting: Oncology

## 2014-06-21 ENCOUNTER — Ambulatory Visit (HOSPITAL_BASED_OUTPATIENT_CLINIC_OR_DEPARTMENT_OTHER)

## 2014-06-21 ENCOUNTER — Ambulatory Visit (HOSPITAL_BASED_OUTPATIENT_CLINIC_OR_DEPARTMENT_OTHER): Payer: Medicare Other | Admitting: Nurse Practitioner

## 2014-06-21 VITALS — BP 112/71 | HR 109 | Temp 97.5°F | Resp 16 | Wt 160.4 lb

## 2014-06-21 DIAGNOSIS — I2699 Other pulmonary embolism without acute cor pulmonale: Secondary | ICD-10-CM | POA: Diagnosis not present

## 2014-06-21 DIAGNOSIS — C259 Malignant neoplasm of pancreas, unspecified: Secondary | ICD-10-CM

## 2014-06-21 DIAGNOSIS — C252 Malignant neoplasm of tail of pancreas: Secondary | ICD-10-CM

## 2014-06-21 DIAGNOSIS — M25511 Pain in right shoulder: Secondary | ICD-10-CM | POA: Insufficient documentation

## 2014-06-21 DIAGNOSIS — M779 Enthesopathy, unspecified: Secondary | ICD-10-CM | POA: Diagnosis not present

## 2014-06-21 DIAGNOSIS — C787 Secondary malignant neoplasm of liver and intrahepatic bile duct: Secondary | ICD-10-CM

## 2014-06-21 DIAGNOSIS — M19011 Primary osteoarthritis, right shoulder: Secondary | ICD-10-CM | POA: Insufficient documentation

## 2014-06-21 DIAGNOSIS — R300 Dysuria: Secondary | ICD-10-CM

## 2014-06-21 DIAGNOSIS — Z8507 Personal history of malignant neoplasm of pancreas: Secondary | ICD-10-CM | POA: Insufficient documentation

## 2014-06-21 LAB — URINALYSIS, MICROSCOPIC - CHCC
Bilirubin (Urine): NEGATIVE
GLUCOSE UR CHCC: 1000 mg/dL
Ketones: NEGATIVE mg/dL
NITRITE: NEGATIVE
PH: 6 (ref 4.6–8.0)
PROTEIN: 30 mg/dL
Specific Gravity, Urine: 1.015 (ref 1.003–1.035)
Urobilinogen, UR: 0.2 mg/dL (ref 0.2–1)

## 2014-06-21 NOTE — Progress Notes (Addendum)
[place] OFFICE PROGRESS NOTE   Diagnosis:   Pancreas cancer  INTERVAL HISTORY:   Mr. Economou returns as scheduled. He was hospitalized 06/04/2014 with chest pain. He was found to have a small left sided pulmonary embolism in addition to the pancreas mass and liver metastases. He is now on anticoagulation.  He has had intermittent pain at the right upper abdomen for the past few days. For the past week he has had intermittent "severe" pain at the right upper arm. He is on MS Contin 60 mg every 12 hours. He has Roxanol for breakthrough pain.  He had nausea yesterday with vomiting. He is not nauseated at present. He denies constipation. No bleeding. He has burning with urination.  His hospice nurse is visiting multiple times a week.  Objective:  Vital signs in last 24 hours:  There were no vitals taken for this visit.    HEENT:  No thrush. Resp:  Lungs clear bilaterally. Cardio:  Regular rate and rhythm. GI:  Tenderness at the right upper quadrant.  Right upper quadrant fullness. Vascular:  Trace edema at the lower legs bilaterally right greater than left. MSK:  Tender at the right upper arm. No mass. Port-A-Cath without erythema.   Lab Results:  Lab Results  Component Value Date   WBC 6.5 06/05/2014   HGB 10.3* 06/05/2014   HCT 30.9* 06/05/2014   MCV 79.2 06/05/2014   PLT 137* 06/05/2014   NEUTROABS 2.3 03/14/2014    Imaging:  No results found.  Medications: I have reviewed the patient's current medications.  Assessment/Plan: 1. Adenocarcinoma of the pancreas status post ultrasound-guided biopsy of a liver lesion 10/18/2012 consistent with metastatic pancreas cancer. CT 10/11/2012 revealed a pancreas tail mass and liver metastases. CA 19-9 in normal range on 10/24/2012. Initiation of gemcitabine/Abraxane on a day 1, day 8 day 15 schedule on 10/28/2012. Current schedule is day one/day 8.  Restaging CT 01/26/2013 revealed a decrease in the size of  liver metastases and no new lesions   Gemcitabine/Abraxane continued on a day 1, day 8 schedule.   Restaging CT 04/25/2013 with a decrease in the size of the pancreas mass and liver lesions.   Gemcitabine/Abraxane continued on a day 1, day 8 schedule.   Restaging CT 08/08/2013-further regression of the pancreas mass, stable liver lesions.   Gemcitabine/Abraxane continued on a day 1, day 8 schedule.   Gemcitabine/Abraxane changed to an every 2 week schedule beginning 10/11/2013.   Restaging CT abdomen/pelvis 12/26/2013 with interval enlargement of a peripherally enhancing lesion in the right hepatic lobe; stable lesion left hepatic lobe; interval mild progression of the mass involving the pancreatic tail.   Cycle 1 FOLFOX 02/07/2014.   Cycle 2 FOLFOX 02/28/2014.   CT abdomen/pelvis 03/08/2014 showed slight interval enlargement of the mass in the tail of the pancreas, enlargement of pre-existing hepatic metastases and 3 new lesions in the dome of the liver. 2. Abdominal pain secondary to the pancreas mass.  3. Anorexia/weight loss.  4. Diabetes.  5. History of coronary artery disease. 6. Headache following cycle 1 day 1 gemcitabine/Abraxane. Question related to Zofran.  7. History of thrombocytopenia secondary to chemotherapy. 8. History of mild to moderate loss of vibratory sense in the fingertips. Question secondary to Abraxane versus diabetes.  9. Depression-he declined an antidepressant. Counseling services were offered as well. 10. History of thrombocytopenia secondary to chemotherapy. 11. MRI lumbar spine 02/14/2014 with disc protrusion L5-S1 with foraminal encroachment bilaterally right greater than left.  12. Hospitalization  03/05/2014 through 03/10/2014 for pain control. 13. Nausea -improved with Haldol 14. Acute pulmonary emboli superior segment left lower lobe pulmonary arterial branches on chest CT 06/04/2014. He is maintained on Apixaban.   Disposition:  Mr. Broughton performance status is declining. He is enrolled in the [facility] hospice program. We will obtain a plain x-ray of the right arm and shoulder today.  I reviewed the instructions for his breakthrough pain medication. We will also obtain a urinalysis to evaluate his complaint of dysuria.  We scheduled a return visit in approximately 3 weeks. He will contact the office in the interim with any problems. We specifically discussed poorly controlled pain.  Patient seen with Dr. Benay Spice.  Ned Card ANP/GNP-BC   06/21/2014  2:46 PM   This was a shared visit with Ned Card. We encouraged Mr. Morelos to increase the use of Roxanol for breakthrough pain. We can increase the MS Contin dose if he requires frequent breakthrough medication.  The right shoulder discomfort may be referred pain from the liver or he could have metastatic disease involving the humerus or shoulder. We will refer him for a plain x-ray today.  [name], M.D.

## 2014-06-21 NOTE — Telephone Encounter (Signed)
gv and printed appt sched and avs for pt for Jan...sent pt to lab

## 2014-06-22 ENCOUNTER — Telehealth: Payer: Self-pay

## 2014-06-22 NOTE — Telephone Encounter (Signed)
-----   Message from Owens Shark, NP sent at 06/22/2014  3:34 PM EST ----- Please let patient know arm and shoulder x-ray were negative for cancer.

## 2014-06-22 NOTE — Telephone Encounter (Signed)
Informed pt of results. Pt verbalized understanding and denies any questions or concerns at this time.

## 2014-06-23 LAB — URINE CULTURE

## 2014-06-25 ENCOUNTER — Other Ambulatory Visit: Payer: Self-pay | Admitting: Oncology

## 2014-06-26 ENCOUNTER — Telehealth: Payer: Self-pay

## 2014-06-26 MED ORDER — AMOXICILLIN 500 MG PO TABS: 500.0000 mg | ORAL_TABLET | Freq: Two times a day (BID) | ORAL | Status: DC

## 2014-06-26 NOTE — Telephone Encounter (Signed)
-----   Message from Owens Shark, NP sent at 06/26/2014 12:24 PM EST ----- Please let patient know the urine culture showed an infection. Please send a prescription to his pharmacy for amoxicillin 500 mg every 12 hours for 5 days with no refills. Thanks.

## 2014-06-26 NOTE — Telephone Encounter (Signed)
Called and informed pt of urine culture results. Informed pt antibiotic would be sent to pharmacy for pt to take it every 12 hrs for 5 days. Pharmacy was verified. Pt verbalized understanding. prescription sent to pt pharmacy. Pt denies any questions or concerns at this time.

## 2014-06-30 ENCOUNTER — Emergency Department (HOSPITAL_COMMUNITY)

## 2014-06-30 ENCOUNTER — Encounter (HOSPITAL_COMMUNITY): Payer: Self-pay | Admitting: Nurse Practitioner

## 2014-06-30 ENCOUNTER — Emergency Department (HOSPITAL_COMMUNITY)
Admission: EM | Admit: 2014-06-30 | Discharge: 2014-06-30 | Disposition: A | Discharge status: Home / Self Care | Attending: Emergency Medicine | Admitting: Emergency Medicine

## 2014-06-30 DIAGNOSIS — Z7982 Long term (current) use of aspirin: Secondary | ICD-10-CM | POA: Diagnosis not present

## 2014-06-30 DIAGNOSIS — I10 Essential (primary) hypertension: Secondary | ICD-10-CM | POA: Insufficient documentation

## 2014-06-30 DIAGNOSIS — Z8507 Personal history of malignant neoplasm of pancreas: Secondary | ICD-10-CM | POA: Diagnosis not present

## 2014-06-30 DIAGNOSIS — Z8719 Personal history of other diseases of the digestive system: Secondary | ICD-10-CM | POA: Diagnosis not present

## 2014-06-30 DIAGNOSIS — Z8739 Personal history of other diseases of the musculoskeletal system and connective tissue: Secondary | ICD-10-CM | POA: Insufficient documentation

## 2014-06-30 DIAGNOSIS — R0789 Other chest pain: Secondary | ICD-10-CM

## 2014-06-30 DIAGNOSIS — Z87442 Personal history of urinary calculi: Secondary | ICD-10-CM | POA: Diagnosis not present

## 2014-06-30 DIAGNOSIS — S0990XA Unspecified injury of head, initial encounter: Secondary | ICD-10-CM | POA: Insufficient documentation

## 2014-06-30 DIAGNOSIS — Y998 Other external cause status: Secondary | ICD-10-CM | POA: Insufficient documentation

## 2014-06-30 DIAGNOSIS — Z951 Presence of aortocoronary bypass graft: Secondary | ICD-10-CM | POA: Diagnosis not present

## 2014-06-30 DIAGNOSIS — Y9389 Activity, other specified: Secondary | ICD-10-CM | POA: Insufficient documentation

## 2014-06-30 DIAGNOSIS — Y9289 Other specified places as the place of occurrence of the external cause: Secondary | ICD-10-CM | POA: Insufficient documentation

## 2014-06-30 DIAGNOSIS — E119 Type 2 diabetes mellitus without complications: Secondary | ICD-10-CM | POA: Insufficient documentation

## 2014-06-30 DIAGNOSIS — Z8619 Personal history of other infectious and parasitic diseases: Secondary | ICD-10-CM | POA: Diagnosis not present

## 2014-06-30 DIAGNOSIS — W01198A Fall on same level from slipping, tripping and stumbling with subsequent striking against other object, initial encounter: Secondary | ICD-10-CM | POA: Diagnosis not present

## 2014-06-30 DIAGNOSIS — S299XXA Unspecified injury of thorax, initial encounter: Secondary | ICD-10-CM | POA: Insufficient documentation

## 2014-06-30 DIAGNOSIS — Z87891 Personal history of nicotine dependence: Secondary | ICD-10-CM | POA: Diagnosis not present

## 2014-06-30 DIAGNOSIS — I2510 Atherosclerotic heart disease of native coronary artery without angina pectoris: Secondary | ICD-10-CM | POA: Diagnosis not present

## 2014-06-30 DIAGNOSIS — Z79899 Other long term (current) drug therapy: Secondary | ICD-10-CM | POA: Insufficient documentation

## 2014-06-30 DIAGNOSIS — W19XXXA Unspecified fall, initial encounter: Secondary | ICD-10-CM

## 2014-06-30 MED ORDER — OXYCODONE-ACETAMINOPHEN 5-325 MG PO TABS: 1.0000 | ORAL_TABLET | Freq: Four times a day (QID) | ORAL | Status: AC | PRN

## 2014-06-30 MED ORDER — OXYCODONE-ACETAMINOPHEN 5-325 MG PO TABS
1.0000 | ORAL_TABLET | Freq: Once | ORAL | Status: AC
  Administered 2014-06-30: 1 via ORAL
  Filled 2014-06-30: qty 1

## 2014-06-30 NOTE — ED Notes (Addendum)
He tripped on Thursday and fell hitting his head and L side of chest. He reports he did lose consciousness. He denies head pain. Hes been having L chest/rib cage pain since, increased on inspiration. He is a&ox4, ambulatory, breathing easily

## 2014-06-30 NOTE — Discharge Instructions (Signed)
Return to the ED with any concerns including vomiting, seizure activity, difficulty breathing, fever/chills, fainting, decreased level of alertness/lethargy, or any other alarming symptoms  You should use the incentive spirometer 5-10 times every hour to help avoid developing pneumonia

## 2014-06-30 NOTE — ED Provider Notes (Signed)
CSN: [id number]     Arrival date & time 06/30/14  1508 History   First MD Initiated Contact with Patient 06/30/14 1848     Chief Complaint  Patient presents with  . Fall     (Consider location/radiation/quality/duration/timing/severity/associated sxs/prior Treatment) HPI  Pt presents with c/o fall 2 days ago. He states he tripped and fell- hit the left side of his head as well as the left side of his chest.  Denies LOC, no vomiting or seizure activity.  He has had left sided headache since the fall.  He also c/o pain in left sided of anterior chest wall.  No difficulty breathing.  Denies neck or back pain.  No difficulty ambulating.  He has not had any treatment prior to arrival but due to continued pain he came to the ED for evaluation.  Prior to the fall he denies having chest pain, palpatiitons, headache, lightheadedness.  There are no other associated systemic symptoms, there are no other alleviating or modifying factors.   Past Medical History  Diagnosis Date  . Diabetes mellitus     type 2  . Kidney stones   . HTN (hypertension)   . HLD (hyperlipidemia)   . CAD (coronary artery disease)     s/p 4v CABG 1990s and redo CABG 2006; cath 2007 showed patent grafts  . Carotid disease, bilateral     06/2010 carotid doppler <40% RICA stenosis and 08-65% LICA stenosis  . Gastric ulcer 2003  . Diverticulitis   . Anal fissure     s/p repair  . Shingles   . Cervical spondylosis   . Sacroiliitis   . Pancreas cancer 10/2012  . Bulging disc    Past Surgical History  Procedure Laterality Date  . Coronary artery bypass graft      1990s (SVG OM2/OM3, SVG to RCA, LIMA to LAD) and redo 2006 (reverse SVT to OM & distal LCx, reverse SVG to PDA, preservation of LIMA)  . Rotator cuff repair      Left  . Knee arthroscopy      Right   Family History  Problem Relation Age of Onset  . Cancer Father    History  Substance Use Topics  . Smoking status: Former Smoker    Quit date: 10/19/2002  .  Smokeless tobacco: Never Used  . Alcohol Use: No    Review of Systems  ROS reviewed and all otherwise negative except for mentioned in HPI    Allergies  Altace  Home Medications   Prior to Admission medications   Medication Sig Start Date End Date Taking? Authorizing Provider  amoxicillin (AMOXIL) 500 MG tablet Take 1 tablet (500 mg total) by mouth 2 (two) times daily. For 5 days 06/26/14   Owens Shark, NP  apixaban (ELIQUIS) 5 MG TABS tablet Take 1 tablet (5 mg total) by mouth 2 (two) times daily. Take 2 tablets (10 mg) oral twice daily for 1 week , till 12/28, Then take 1 tablet oral (5mg )  twice daily from 12/29 . Insurance approval number [id number] approved till 12/06/2014 06/06/14   Phillips Climes, MD  Artificial Tear Solution (JUST TEARS EYE DROPS OP) Apply 1 drop to eye as needed (dry eyes).     Historical Provider, MD  aspirin EC 81 MG tablet Take 81 mg by mouth daily.    Historical Provider, MD  clotrimazole (LOTRIMIN) 1 % cream Apply 1 application topically as needed (for rash). Groin rash 05/15/14   Historical Provider, MD  diphenhydramine-acetaminophen (  TYLENOL PM) 25-500 MG TABS Take 1 tablet by mouth at bedtime as needed (for sleep).    Historical Provider, MD  glipiZIDE (GLUCOTROL) 5 MG tablet Take 0.5 tablets (2.5 mg total) by mouth daily before breakfast. 06/06/14   Phillips Climes, MD  haloperidol (HALDOL) 2 MG tablet Take 2 mg by mouth every 4 (four) hours as needed (prn nausea). 04/16/14   Ardeen Jourdain, MD  isosorbide mononitrate (IMDUR) 60 MG 24 hr tablet Take 1 tablet (60 mg total) by mouth every morning. 07/03/13   Belva Crome III, MD  lidocaine-prilocaine (EMLA) cream Apply topically as needed. apply over port 1-2 hours prior to chemotherapy. 05/17/13   Owens Shark, NP  LORazepam (ATIVAN) 0.5 MG tablet Take 1 tablet (0.5 mg total) by mouth every 8 (eight) hours as needed (PRN nausea). 02/28/14   Drue Second, NP  megestrol (MEGACE) 40 MG/ML suspension  TAKE 5 MLS (200 MG TOTAL) BY MOUTH 2 (TWO) TIMES DAILY. 06/25/14   Ladell Pier, MD  morphine (MS CONTIN) 60 MG 12 hr tablet Take 1 tablet (60 mg total) by mouth every 12 (twelve) hours. 05/25/14   Ladell Pier, MD  morphine (ROXANOL) 20 MG/ML concentrated solution Take 1-2 ml every 4 hours as needed 03/05/14   Ladell Pier, MD  nitroGLYCERIN (NITROSTAT) 0.4 MG SL tablet Place 0.4 mg under the tongue every 5 (five) minutes as needed for chest pain.    Historical Provider, MD  ondansetron (ZOFRAN) 8 MG tablet Take 8 mg by mouth 3 (three) times daily as needed for nausea or vomiting.  04/14/14   Historical Provider, MD  oxyCODONE-acetaminophen (PERCOCET/ROXICET) 5-325 MG per tablet Take 1-2 tablets by mouth every 6 (six) hours as needed for severe pain. 06/30/14   Threasa Beards, MD  polyethylene glycol (MIRALAX / GLYCOLAX) packet Take 17 g by mouth daily as needed for moderate constipation.    Historical Provider, MD  senna-docusate (SENOKOT-S) 8.6-50 MG per tablet Take 1 tablet by mouth 2 (two) times daily. 02/20/14   April K Palumbo-Rasch, MD  sorbitol 70 % solution Take 15 mLs by mouth daily as needed (for severe constipation). Patient not taking: Reported on 06/21/2014 05/01/14   Ladell Pier, MD  vitamin B-12 (CYANOCOBALAMIN) 1000 MCG tablet Take 1,000 mcg by mouth daily.    Historical Provider, MD   BP 126/78 mmHg  Pulse 62  Temp(Src) 97.9 F (36.6 C) (Oral)  Resp 15  SpO2 100%  Vitals reviewed Physical Exam  Physical Examination: General appearance - alert, well appearing, and in no distress Mental status - alert, oriented to person, place, and time Eyes - pupils equal and reactive, extraocular eye movements intact Mouth - mucous membranes moist, pharynx normal without lesions Neck - no midline tenderness to palpation, FROM without pain Chest - clear to auscultation, no wheezes, rales or rhonchi, symmetric air entry Heart - normal rate, regular rhythm, normal S1, S2, no murmurs,  rubs, clicks or gallops Abdomen - soft, nontender, nondistended, no masses or organomegaly Extremities - peripheral pulses normal, no pedal edema, no clubbing or cyanosis Skin - normal coloration and turgor, no rashes  ED Course  Procedures (including critical care time) Labs Review Labs Reviewed - No data to display  Imaging Review Dg Chest 2 View  06/30/2014   CLINICAL DATA:  LEFT chest pain more on lower aspect since he fell 2 days ago, loss of consciousness, history coronary artery disease post MI, diabetes, former smoker, hypertension, pancreatic cancer  EXAM: CHEST  2 VIEW  COMPARISON:  06/04/2014  FINDINGS: RIGHT jugular Port-A-Cath stable tip projecting over SVC.  Normal heart size post CABG.  Atherosclerotic calcification aorta.  Mediastinal contours and pulmonary vascularity normal.  Lungs clear.  No pleural effusion or pneumothorax.  Bones unremarkable.  IMPRESSION: Post CABG.  No acute abnormalities.   Electronically Signed   By: Lavonia Dana M.D.   On: 06/30/2014 16:02   Ct Head Wo Contrast  06/30/2014   CLINICAL DATA:  Status post fall 06/28/2014 with a blow to the left side of the head and loss of consciousness. Pain.  EXAM: CT HEAD WITHOUT CONTRAST  TECHNIQUE: Contiguous axial images were obtained from the base of the skull through the vertex without intravenous contrast.  COMPARISON:  None.  FINDINGS: There is cortical atrophy and chronic microvascular ischemic change. No acute intracranial abnormality including hemorrhage, infarct, mass lesion, mass effect, midline shift or abnormal extra-axial fluid collection is identified. There is no hydrocephalus or pneumocephalus. The calvarium is intact. Imaged paranasal sinuses and mastoid air cells are clear.  IMPRESSION: No acute finding.  Atrophy and chronic microvascular ischemic change.   Electronically Signed   By: Inge Rise M.D.   On: 06/30/2014 16:48     EKG Interpretation   Date/Time:  Saturday June 30 2014 15:17:29  EST Ventricular Rate:  84 PR Interval:  136 QRS Duration: 94 QT Interval:  382 QTC Calculation: 451 R Axis:   73 Text Interpretation:  Sinus rhythm with marked sinus arrhythmia with  occasional Premature ventricular complexes Possible Left atrial  enlargement Borderline ECG No significant change since last tracing  Confirmed by [facility]  MD, Earnest Thalman [phone]) on 06/30/2014 7:23:44 PM      MDM   Final diagnoses:  Fall, initial encounter  Minor head injury, initial encounter  Chest wall pain    Pt presenting with c/o left sided chest wall pain and left head pain after mechanical fall 2 days ago.  CT head and chest xray are reassuring.  No crepitus or bruising over chest wall.  Will treat clinically for rib fracture- pt given pain medication and incentive spirometry.  Discharged with strict return precautions.  Pt agreeable with plan.    Threasa Beards, MD 06/30/14 2258

## 2014-07-11 ENCOUNTER — Ambulatory Visit (HOSPITAL_BASED_OUTPATIENT_CLINIC_OR_DEPARTMENT_OTHER): Admitting: Oncology

## 2014-07-11 ENCOUNTER — Telehealth: Payer: Self-pay | Admitting: Oncology

## 2014-07-11 VITALS — BP 111/71 | HR 85 | Temp 94.7°F | Resp 18 | Ht 73.0 in | Wt 160.4 lb

## 2014-07-11 DIAGNOSIS — I2699 Other pulmonary embolism without acute cor pulmonale: Secondary | ICD-10-CM

## 2014-07-11 DIAGNOSIS — C787 Secondary malignant neoplasm of liver and intrahepatic bile duct: Secondary | ICD-10-CM

## 2014-07-11 DIAGNOSIS — Z452 Encounter for adjustment and management of vascular access device: Secondary | ICD-10-CM

## 2014-07-11 DIAGNOSIS — C252 Malignant neoplasm of tail of pancreas: Secondary | ICD-10-CM | POA: Diagnosis not present

## 2014-07-11 DIAGNOSIS — Z95828 Presence of other vascular implants and grafts: Secondary | ICD-10-CM

## 2014-07-11 MED ORDER — SODIUM CHLORIDE 0.9 % IJ SOLN
10.0000 mL | INTRAMUSCULAR | Status: DC | PRN
  Administered 2014-07-11: 10 mL via INTRAVENOUS
  Filled 2014-07-11: qty 10

## 2014-07-11 MED ORDER — MORPHINE SULFATE 15 MG PO TABS: 15.0000 mg | ORAL_TABLET | ORAL | Status: AC | PRN

## 2014-07-11 MED ORDER — HEPARIN SOD (PORK) LOCK FLUSH 100 UNIT/ML IV SOLN
500.0000 [IU] | Freq: Once | INTRAVENOUS | Status: AC
  Administered 2014-07-11: 500 [IU] via INTRAVENOUS
  Filled 2014-07-11: qty 5

## 2014-07-11 MED ORDER — APIXABAN 2.5 MG PO TABS: 2.5000 mg | ORAL_TABLET | Freq: Two times a day (BID) | ORAL | Status: AC

## 2014-07-11 NOTE — Telephone Encounter (Signed)
Pt confirmed MD visit per 01/27 POF, gave pt AVS.... KJ

## 2014-07-11 NOTE — Progress Notes (Signed)
[facility] OFFICE PROGRESS NOTE   Diagnosis: Pancreas cancer  INTERVAL HISTORY:   He returns as scheduled. He continues follow-up with the [facility] program. Mr. Maahs reports a good appetite. He discontinued Eliquis approximate 2 weeks ago. He had an episode of gross hematuria this week. He complains of pain at the right costal margin this week. The pain is pleuritic. No dyspnea. He has an intermittent cough. He is constipated. No bleeding aside from the episode of gross hematuria.  The pain is improved with MS Contin and Roxanol. He does not like the taste of Roxanol and requests a tablet for breakthrough pain.  Objective:  Vital signs in last 24 hours:  Blood pressure 111/71, pulse 85, temperature 94.7 F (34.8 C), temperature source Oral, resp. rate 18, height 6\' 1"  (1.854 m), weight 160 lb 6.4 oz (72.757 kg).    Resp: Clear the posterior chest bilaterally, decreased breath sounds with a slight rub at the right lower anterolateral chest, no respiratory distress Cardio: Regular rate and rhythm GI: No hepatomegaly, tender at the right costal margin, no mass Vascular: Trace pitting edema at the right lower leg and ankle Neuro: Alert, follows commands    Portacath/PICC-without erythema   Medications: I have reviewed the patient's current medications.  Assessment/Plan: 1. Adenocarcinoma of the pancreas status post ultrasound-guided biopsy of a liver lesion 10/18/2012 consistent with metastatic pancreas cancer. CT 10/11/2012 revealed a pancreas tail mass and liver metastases. CA 19-9 in normal range on 10/24/2012. Initiation of gemcitabine/Abraxane on a day 1, day 8 day 15 schedule on 10/28/2012. Current schedule is day one/day 8.  Restaging CT 01/26/2013 revealed a decrease in the size of liver metastases and no new lesions   Gemcitabine/Abraxane continued on a day 1, day 8 schedule.   Restaging CT 04/25/2013 with a decrease in the size of the  pancreas mass and liver lesions.   Gemcitabine/Abraxane continued on a day 1, day 8 schedule.   Restaging CT 08/08/2013-further regression of the pancreas mass, stable liver lesions.   Gemcitabine/Abraxane continued on a day 1, day 8 schedule.   Gemcitabine/Abraxane changed to an every 2 week schedule beginning 10/11/2013.   Restaging CT abdomen/pelvis 12/26/2013 with interval enlargement of a peripherally enhancing lesion in the right hepatic lobe; stable lesion left hepatic lobe; interval mild progression of the mass involving the pancreatic tail.   Cycle 1 FOLFOX 02/07/2014.   Cycle 2 FOLFOX 02/28/2014.   CT abdomen/pelvis 03/08/2014 showed slight interval enlargement of the mass in the tail of the pancreas, enlargement of pre-existing hepatic metastases and 3 new lesions in the dome of the liver. 2. Abdominal pain secondary to the pancreas mass.  3. Anorexia/weight loss.  4. Diabetes.  5. History of coronary artery disease. 6. Headache following cycle 1 day 1 gemcitabine/Abraxane. Question related to Zofran.  7. History of thrombocytopenia secondary to chemotherapy. 8. History of mild to moderate loss of vibratory sense in the fingertips. Question secondary to Abraxane versus diabetes.  9. Depression-he declined an antidepressant. Counseling services were offered as well. 10. History of thrombocytopenia secondary to chemotherapy. 11. MRI lumbar spine 02/14/2014 with disc protrusion L5-S1 with foraminal encroachment bilaterally right greater than left.  12. Hospitalization 03/05/2014 through 03/10/2014 for pain control. 13. Nausea -improved with Haldol        14.Acute pulmonary emboli superior segment left lower lobe pulmonary arterial branches on chest CT 06/04/2014. He will resume Apixaban   Disposition:  He appears to be slowly declining. I suspect the  pain is related to liver metastases. I have a low clinical suspicion for a pulmonary embolism. He will continue  MS Contin. Roxanol will be discontinued and he will use MSIR for breakthrough pain.  I discussed the risk/benefit of anticoagulation therapy with Mr. Haegele and his family. They would like to treat the pulmonary embolism. He will resume Eliquis. We reduce the dose to 2.5 mg twice daily.  He will return for an office visit in 4 weeks. He will contact us for bleeding or new symptoms in the interim. [name], MD  07/11/2014  11:35 AM

## 2014-07-20 ENCOUNTER — Telehealth: Payer: Self-pay

## 2014-07-20 NOTE — Telephone Encounter (Signed)
Lexa called to inform us pt had another fall yesterday, no injuries. It was likely d/t low BS. BS in am was 57. Dr Felipa Eth handles his BS and he was called.

## 2014-07-24 ENCOUNTER — Telehealth: Payer: Self-pay | Admitting: *Deleted

## 2014-07-24 NOTE — Telephone Encounter (Signed)
Christian Kaiser with Hospice called reporting patient has decided to be DNR.  Would like to know if Dr. Benay Spice would like to sign.  Verbal order recceived and read back from Dr. Benay Spice for Hospice Provider to sign DNR order.

## 2014-08-03 ENCOUNTER — Other Ambulatory Visit: Payer: Self-pay | Admitting: Oncology

## 2014-08-13 ENCOUNTER — Ambulatory Visit: Payer: Medicare Other | Admitting: Nurse Practitioner

## 2014-08-14 ENCOUNTER — Telehealth: Payer: Self-pay

## 2014-08-14 ENCOUNTER — Telehealth: Payer: Self-pay | Admitting: Nurse Practitioner

## 2014-08-14 NOTE — Telephone Encounter (Signed)
Pt had missed appt for 08/13/14. Spoke with pt son who states Christian Kaiser is not doing to well but [name] would like to reschedule the appt. Informed them that the scheduling dept would be in contact with them to reschedule.

## 2014-08-14 NOTE — Telephone Encounter (Signed)
Pt's son called to r/s from 02/29 pt missed apt r/s for 03/04 pt's son confirmed.... KJ

## 2014-08-16 ENCOUNTER — Telehealth: Payer: Self-pay | Admitting: *Deleted

## 2014-08-16 NOTE — Telephone Encounter (Signed)
Otila Kluver RN with Hospice called reporting "Mr. Fornes will not be able to come in tomorrow.  His wife said she can't bring him in.  He's lost weight down to maybe 138 lbs.  We've increased MSIR to three times daily and he is getting better pain control.  He spends the majority of time in bed and can only walk to the kitchen and back.  There is no need for him to come in for f/u because he can't get there short of an ambulance."  Will notify Providers.

## 2014-08-17 ENCOUNTER — Ambulatory Visit: Payer: Medicare Other | Admitting: Nurse Practitioner

## 2014-08-31 ENCOUNTER — Telehealth: Payer: Self-pay | Admitting: *Deleted

## 2014-08-31 NOTE — Telephone Encounter (Signed)
Spouse [place] called to notify us Mikle passed away on Tuesday 22-Sep-2014.  Asked that I notify Dr. Benay Spice and Lattie Haw that she thanks them for all they did caring for him.  Will notify providers of this call.

## 2014-09-04 ENCOUNTER — Telehealth: Payer: Self-pay | Admitting: Oncology

## 2014-09-04 NOTE — Telephone Encounter (Signed)
Received death certificate [birth year]

## 2014-09-14 DEATH — deceased

## 2015-09-07 IMAGING — CT CT ABD-PELV W/ CM
2 of 9 series · 12 of 46 positions shown, 18 images · IV contrast (OMNIPAQUE)
Comparison: 04/25/2013

ADDENDUM:
Impression #1 should read continued regression of the pancreatic
mass.

Under findings the second paragraph, eighth sentence should read:
The pancreatic body/tail mass continues to regress.
CLINICAL DATA: Pancreatic cancer.
EXAM:
CT ABDOMEN AND PELVIS WITH CONTRAST
TECHNIQUE: Multidetector CT imaging of the abdomen and pelvis was performed
using the standard protocol following bolus administration of
intravenous contrast.
CONTRAST:  100mL OMNIPAQUE IOHEXOL 300 MG/ML  SOLN

[Series 6: venous thins pacs · axial · portal-venous · 0.85mm/px · z∈[-520,-188]mm · 9 of 139 slices shown, 15 images]
[im 14/139  soft-tissue]
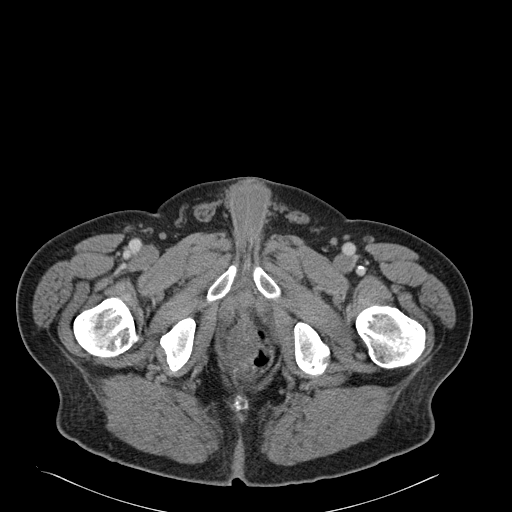
[im 14/139  bone]
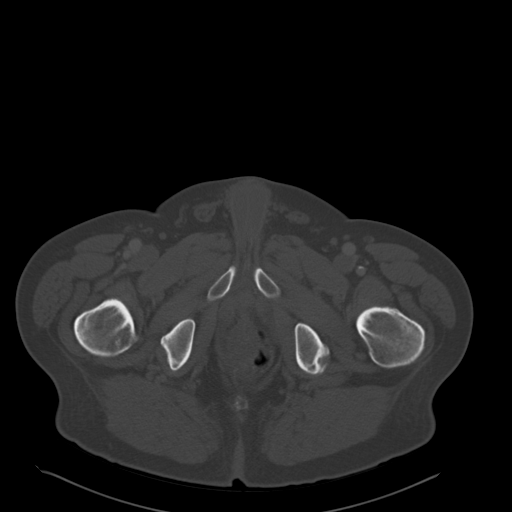
[im 28/139  soft-tissue]
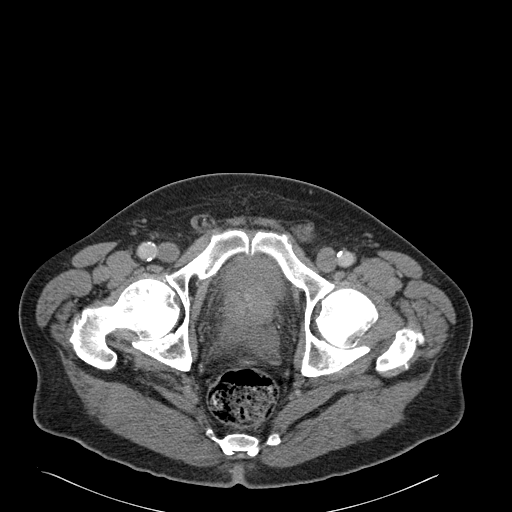
[im 42/139  soft-tissue]
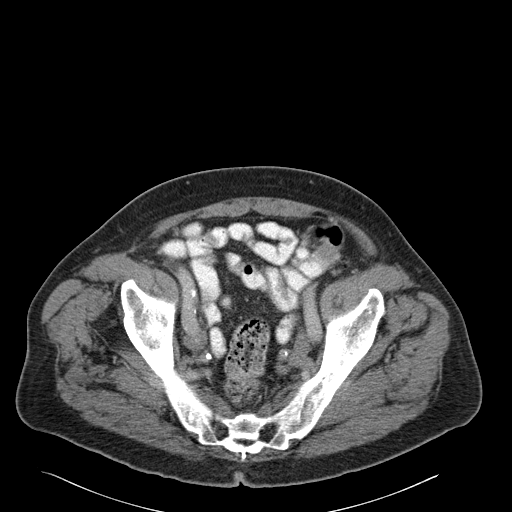
[im 56/139  soft-tissue]
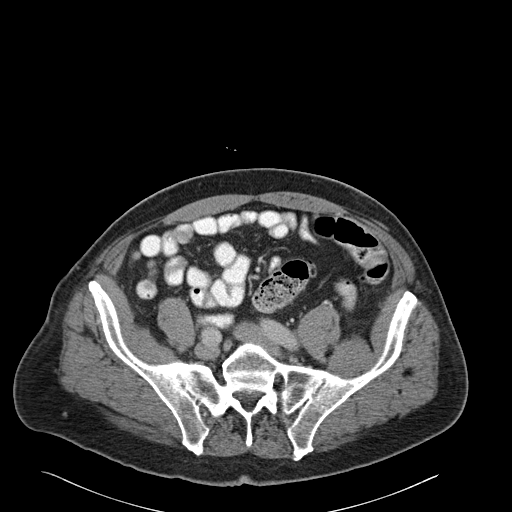
[im 70/139  soft-tissue]
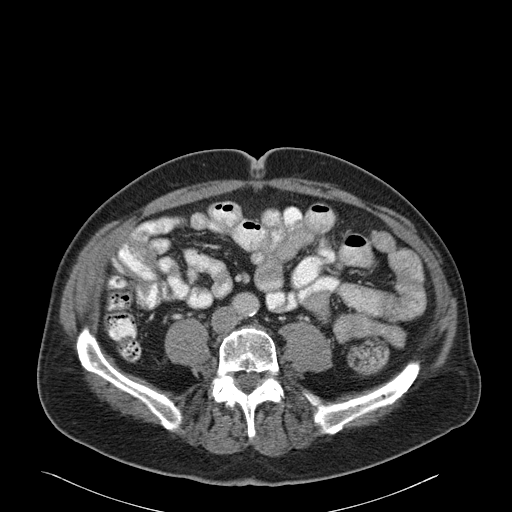
[im 83/139  soft-tissue]
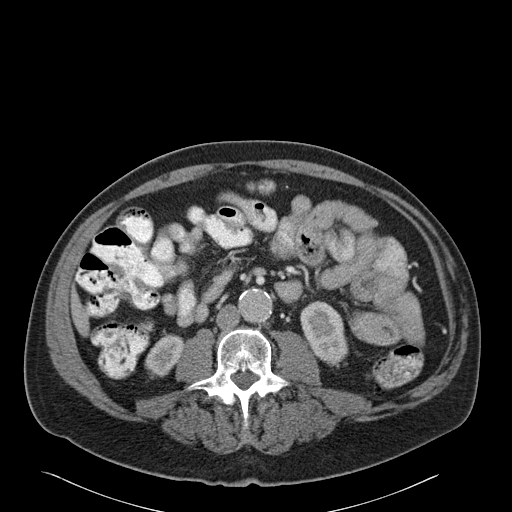
[im 83/139  lung]
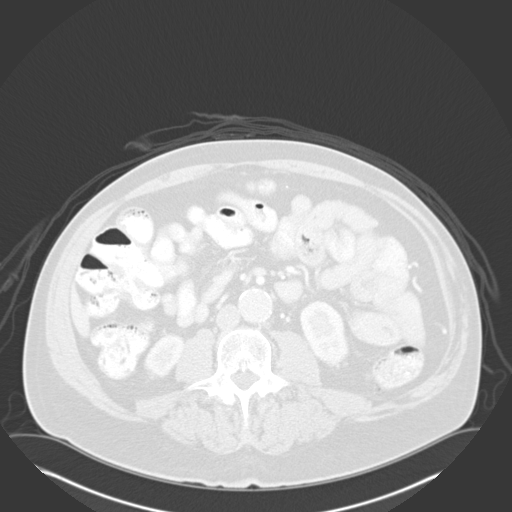
[im 97/139  soft-tissue]
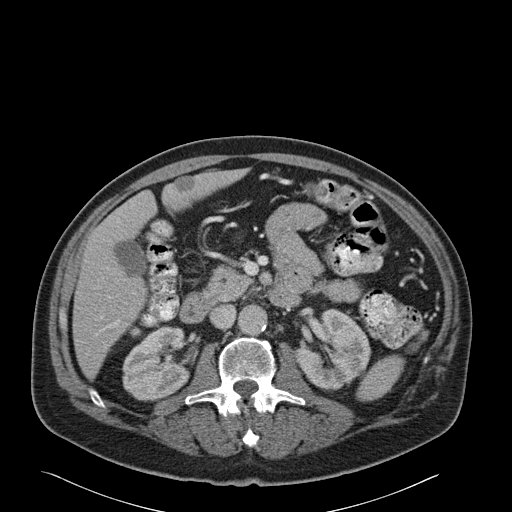
[im 97/139  lung]
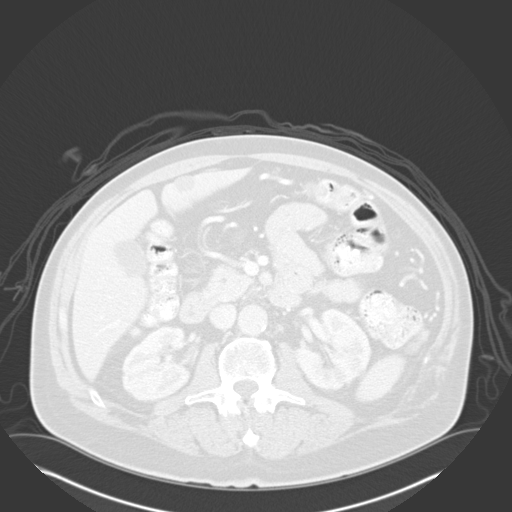
[im 111/139  soft-tissue]
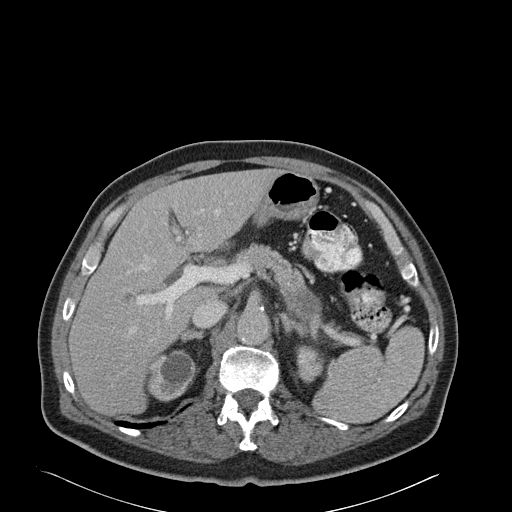
[im 111/139  lung]
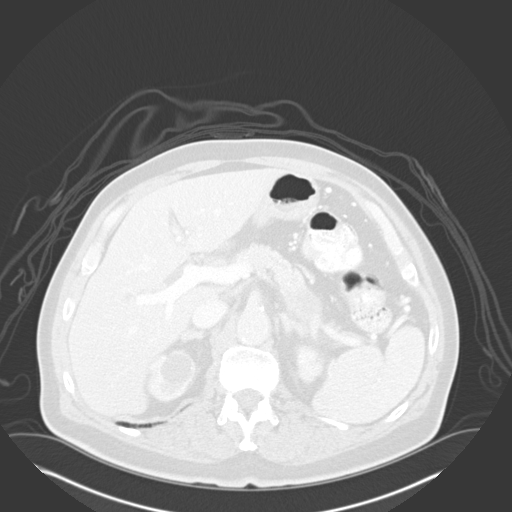
[im 125/139  soft-tissue]
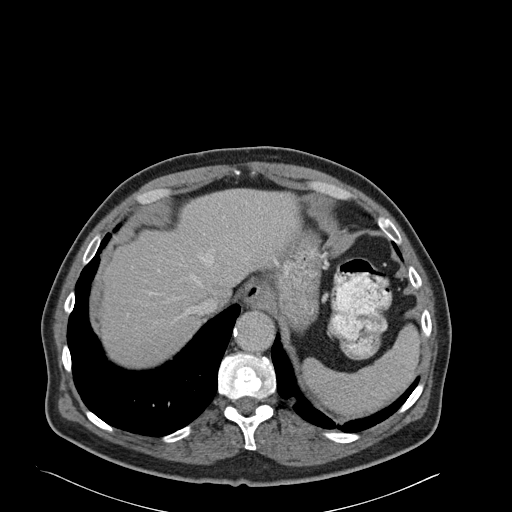
[im 125/139  lung]
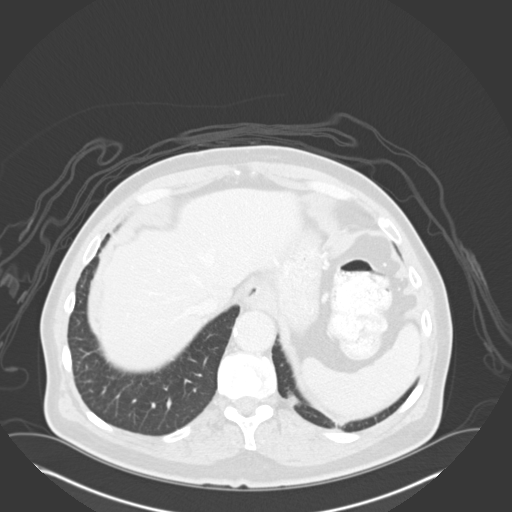
[im 125/139  bone]
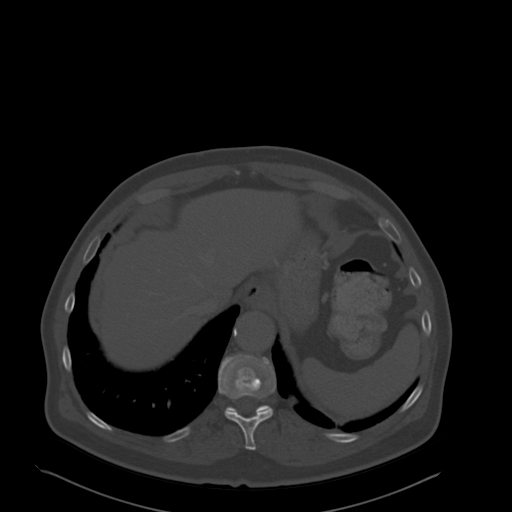

[Series 602: <mpr thick range> · coronal · 0.85mm/px · 3 of 138 slices shown]
[im 28/138  soft-tissue]
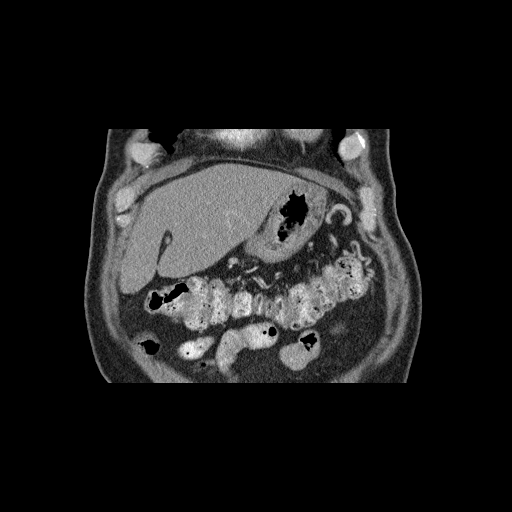
[im 55/138  soft-tissue]
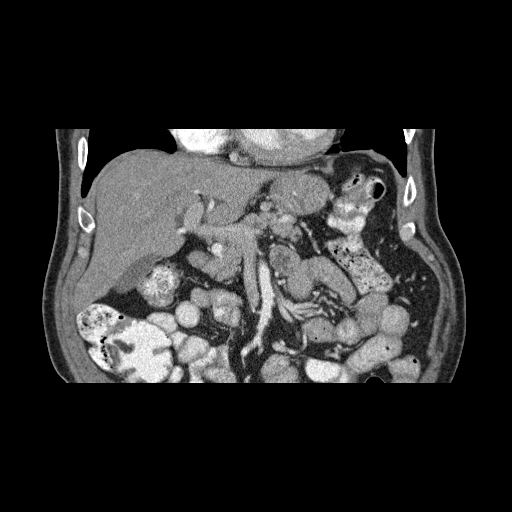
[im 83/138  soft-tissue]
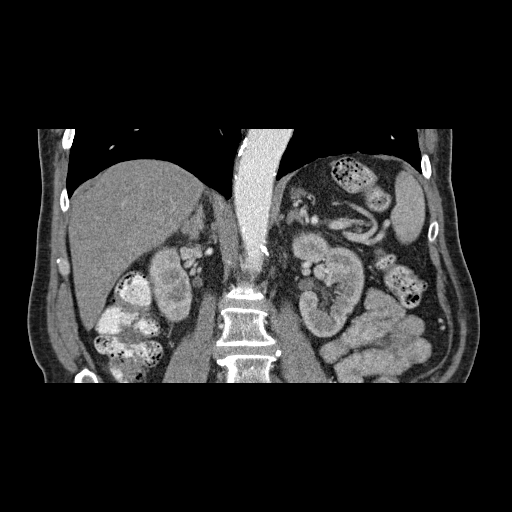

[12 of 46 positions shown; findings below may reference images not displayed]

FINDINGS: The lung bases are clear of acute process. Stable nodular densities
at the left lung base.

Examination of the liver demonstrates a stable lesion in the medial
segment of the left hepatic lobe on image number 43. This measures a
maximum of 17 mm. There is also a small focus of low attenuation
near the middle portal vein on image number 30 which measures 10 mm.
No new lesions. No biliary dilatation. The gallbladder is normal. No
common bile duct dilatation. The pancreatic body/tail mass continues
tissue or aggression. It measures 27.5 x 20 mm and previously
measured 36.5 x 25.5 mm.

No mesenteric or retroperitoneal mass or adenopathy. Stable
nodularity of both adrenal glands. Again demonstrated is occlusion
of the splenic vein with prominent collateral vessels.

The spleen is normal. The kidneys are stable with a stable complex
cyst associated with the upper pole region of the right kidney.

Stable infrarenal abdominal aortic aneurysm measuring a maximum of
3.2 x 2.9 cm on image number 55.

The stomach, duodenum, small bowel and colon are unremarkable. No
inflammatory changes, mass lesions or obstructive findings.

The bladder, prostate gland and seminal vesicles are stable. No
pelvic mass, adenopathy or free pelvic fluid collections. Stable
atherosclerotic calcifications involving iliac vessels. No inguinal
mass or adenopathy. Stable SI joint fusion. No worrisome bone
lesions.

The bony structures are unremarkable.
IMPRESSION: 1. Continued progression of the pancreatic mass.
2. Stable small hepatic lesions. No progressive disease or new
lesions.
3. Stable nodular densities at the left lung base.
4. Stable bilateral adrenal gland nodules.
5. Occlusion of the splenic vein with associated in collateral
vessels.
6. Stable infrarenal abdominal aortic aneurysm.
